# Patient Record
Sex: Female | Born: 1945
Health system: Southern US, Community
[De-identification: ages and names within clinical notes are randomized; demographics above are authoritative.]

## PROBLEM LIST (undated history)

## (undated) DIAGNOSIS — T4145XA Adverse effect of unspecified anesthetic, initial encounter: Secondary | ICD-10-CM

## (undated) DIAGNOSIS — T7840XA Allergy, unspecified, initial encounter: Secondary | ICD-10-CM

## (undated) DIAGNOSIS — A048 Other specified bacterial intestinal infections: Secondary | ICD-10-CM

## (undated) DIAGNOSIS — R51 Headache: Secondary | ICD-10-CM

## (undated) DIAGNOSIS — R911 Solitary pulmonary nodule: Secondary | ICD-10-CM

## (undated) DIAGNOSIS — M1990 Unspecified osteoarthritis, unspecified site: Secondary | ICD-10-CM

## (undated) DIAGNOSIS — I499 Cardiac arrhythmia, unspecified: Secondary | ICD-10-CM

## (undated) DIAGNOSIS — K449 Diaphragmatic hernia without obstruction or gangrene: Secondary | ICD-10-CM

## (undated) DIAGNOSIS — K2970 Gastritis, unspecified, without bleeding: Secondary | ICD-10-CM

## (undated) DIAGNOSIS — K219 Gastro-esophageal reflux disease without esophagitis: Secondary | ICD-10-CM

## (undated) DIAGNOSIS — M549 Dorsalgia, unspecified: Secondary | ICD-10-CM

## (undated) DIAGNOSIS — R519 Headache, unspecified: Secondary | ICD-10-CM

## (undated) DIAGNOSIS — Z86010 Personal history of colonic polyps: Secondary | ICD-10-CM

## (undated) DIAGNOSIS — E785 Hyperlipidemia, unspecified: Secondary | ICD-10-CM

## (undated) DIAGNOSIS — C801 Malignant (primary) neoplasm, unspecified: Secondary | ICD-10-CM

## (undated) DIAGNOSIS — E039 Hypothyroidism, unspecified: Secondary | ICD-10-CM

## (undated) DIAGNOSIS — T8859XA Other complications of anesthesia, initial encounter: Secondary | ICD-10-CM

## (undated) DIAGNOSIS — IMO0001 Reserved for inherently not codable concepts without codable children: Secondary | ICD-10-CM

## (undated) HISTORY — PX: TMJ ARTHROSCOPY: SHX1067

## (undated) HISTORY — DX: Allergy, unspecified, initial encounter: T78.40XA

## (undated) HISTORY — DX: Other specified bacterial intestinal infections: A04.8

## (undated) HISTORY — PX: DILATION AND CURETTAGE OF UTERUS: SHX78

## (undated) HISTORY — DX: Gastro-esophageal reflux disease without esophagitis: K21.9

## (undated) HISTORY — DX: Solitary pulmonary nodule: R91.1

## (undated) HISTORY — DX: Personal history of colonic polyps: Z86.010

## (undated) HISTORY — DX: Unspecified osteoarthritis, unspecified site: M19.90

## (undated) HISTORY — PX: TOTAL ABDOMINAL HYSTERECTOMY: SHX209

## (undated) HISTORY — DX: Gastritis, unspecified, without bleeding: K29.70

## (undated) HISTORY — DX: Hyperlipidemia, unspecified: E78.5

## (undated) HISTORY — DX: Diaphragmatic hernia without obstruction or gangrene: K44.9

## (undated) HISTORY — PX: LAPAROSCOPY: SHX197

## (undated) HISTORY — PX: APPENDECTOMY: SHX54

## (undated) HISTORY — DX: Dorsalgia, unspecified: M54.9

---

## 1998-02-14 ENCOUNTER — Other Ambulatory Visit: Admission: RE | Admit: 1998-02-14 | Discharge: 1998-02-14 | Payer: Self-pay | Admitting: Obstetrics and Gynecology

## 1998-03-17 ENCOUNTER — Encounter: Payer: Self-pay | Admitting: Emergency Medicine

## 1998-03-17 ENCOUNTER — Emergency Department (HOSPITAL_COMMUNITY): Admission: EM | Admit: 1998-03-17 | Discharge: 1998-03-17 | Payer: Self-pay | Admitting: Emergency Medicine

## 2000-06-03 ENCOUNTER — Encounter (INDEPENDENT_AMBULATORY_CARE_PROVIDER_SITE_OTHER): Payer: Self-pay | Admitting: Gastroenterology

## 2001-03-06 ENCOUNTER — Other Ambulatory Visit: Admission: RE | Admit: 2001-03-06 | Discharge: 2001-03-06 | Payer: Self-pay | Admitting: Obstetrics and Gynecology

## 2003-12-23 ENCOUNTER — Ambulatory Visit: Payer: Self-pay | Admitting: Gastroenterology

## 2004-03-13 ENCOUNTER — Other Ambulatory Visit: Admission: RE | Admit: 2004-03-13 | Discharge: 2004-03-13 | Payer: Self-pay | Admitting: Obstetrics and Gynecology

## 2004-04-27 ENCOUNTER — Inpatient Hospital Stay (HOSPITAL_COMMUNITY): Admission: EM | Admit: 2004-04-27 | Discharge: 2004-04-30 | Payer: Self-pay | Admitting: Emergency Medicine

## 2004-05-02 ENCOUNTER — Inpatient Hospital Stay (HOSPITAL_COMMUNITY): Admission: EM | Admit: 2004-05-02 | Discharge: 2004-05-04 | Payer: Self-pay | Admitting: Emergency Medicine

## 2004-05-02 ENCOUNTER — Ambulatory Visit: Payer: Self-pay | Admitting: Infectious Diseases

## 2004-05-06 ENCOUNTER — Ambulatory Visit: Payer: Self-pay | Admitting: Internal Medicine

## 2004-05-27 ENCOUNTER — Ambulatory Visit: Payer: Self-pay | Admitting: Infectious Diseases

## 2005-01-12 ENCOUNTER — Ambulatory Visit: Payer: Self-pay | Admitting: Internal Medicine

## 2005-05-06 ENCOUNTER — Ambulatory Visit: Payer: Self-pay | Admitting: Gastroenterology

## 2007-01-03 ENCOUNTER — Ambulatory Visit: Payer: Self-pay | Admitting: Gastroenterology

## 2007-01-18 ENCOUNTER — Ambulatory Visit: Payer: Self-pay | Admitting: Gastroenterology

## 2008-05-27 DIAGNOSIS — K2970 Gastritis, unspecified, without bleeding: Secondary | ICD-10-CM | POA: Insufficient documentation

## 2008-05-27 DIAGNOSIS — K222 Esophageal obstruction: Secondary | ICD-10-CM | POA: Insufficient documentation

## 2008-05-27 DIAGNOSIS — K2990 Gastroduodenitis, unspecified, without bleeding: Secondary | ICD-10-CM

## 2008-05-28 ENCOUNTER — Ambulatory Visit: Payer: Self-pay | Admitting: Gastroenterology

## 2008-05-28 DIAGNOSIS — R070 Pain in throat: Secondary | ICD-10-CM | POA: Insufficient documentation

## 2008-05-28 DIAGNOSIS — K219 Gastro-esophageal reflux disease without esophagitis: Secondary | ICD-10-CM

## 2008-05-28 HISTORY — DX: Gastro-esophageal reflux disease without esophagitis: K21.9

## 2008-06-17 ENCOUNTER — Ambulatory Visit: Payer: Self-pay | Admitting: Gastroenterology

## 2008-06-17 ENCOUNTER — Encounter: Payer: Self-pay | Admitting: Gastroenterology

## 2008-06-19 ENCOUNTER — Encounter: Payer: Self-pay | Admitting: Gastroenterology

## 2008-07-30 ENCOUNTER — Ambulatory Visit: Payer: Self-pay | Admitting: Gastroenterology

## 2010-03-10 NOTE — Assessment & Plan Note (Signed)
Summary: ACID REFLUX.[name] KitchenEM    History of Present Illness Visit Type: Follow-up Consult Primary GI MD: Sheryn Bison MD FACP FAGA Primary Catheryn Slifer: Gery Pray, MD Chief Complaint: throat pain History of Present Illness:   This patient is a 65 year old white female self-referred for evaluation of hoarseness and discomfort in her throat with atypical reflux symptoms. Her last endoscopic exam was by Dr. Victorino Dike in 2002. At that time she did have a peptic stricture versus off as was dilated.  She denies typical reflux symptoms such as burning substernal chest pain regurgitation. However, her throat difficult to do occur at night and waking her from sleep. She denies other gastrointestinal or hepatobiliary complaints. She had normal metaprolol 20 mg a day with breakfast for many years. She is up-to-date on colonoscopy exams which she has done because of a family history of colon cancer her father. She denies bowel problems, melena, or hematochezia. Her appetite is good and her weight is stable. 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 She does not smoke or abuse ethanol or NSAIDs.   GI Review of Systems    Reports heartburn.      Denies abdominal pain, acid reflux, belching, bloating, chest pain, dysphagia with liquids, dysphagia with solids, loss of appetite, nausea, vomiting, vomiting blood, weight loss, and  weight gain.        Denies anal fissure, black tarry stools, change in bowel habit, constipation, diarrhea, diverticulosis, fecal incontinence, heme positive stool, hemorrhoids, irritable bowel syndrome, jaundice, light color stool, liver problems, rectal bleeding, and  rectal pain.    Current Medications (verified): 1)  Omeprazole 20 Mg Cpdr (Omeprazole) .... Take One Capsule Daily  Allergies (verified): No Known Drug Allergies  Past History:  Past Medical History:    Current Problems:     COLONIC POLYPS, HX OF (ICD-V12.72)    GASTRITIS  (ICD-535.50)    ESOPHAGEAL STRICTURE (ICD-530.3)    ADENOCARCINOMA, COLON, FAMILY HX (ICD-V16.0)    DIVERTICULOSIS, COLON (ICD-562.10)     (05/27/2008)  Past Surgical History:    Hysterectomy and bilateral oophorectomy 1978    Right TMJ 1985 (05/27/2008)  Family History:    Family History of Colon Cancer: father    Family History of Heart Disease: mother    Family History of Diabetes:Brother    Family History of Kidney Disease:Brother    Family History of Ovarian Cancer:Sister    Family History of Colon Polyps:Brother  Review of Systems       The patient complains of arthritis/joint pain.  The patient denies allergy/sinus, anemia, anxiety-new, back pain, blood in urine, breast changes/lumps, change in vision, confusion, cough, coughing up blood, depression-new, fainting, fatigue, fever, headaches-new, hearing problems, heart murmur, heart rhythm changes, itching, menstrual pain, muscle pains/cramps, night sweats, nosebleeds, pregnancy symptoms, shortness of breath, skin rash, sleeping problems, sore throat, swelling of feet/legs, swollen lymph glands, thirst - excessive , urination - excessive , urination changes/pain, urine leakage, vision changes, and voice change.    Vital Signs:  Patient profile:   65 year old female Height:      62.5 inches Weight:      128.13 pounds BMI:     23.15 Pulse rate:   60 / minute Pulse rhythm:   regular BP sitting:   124 / 80  (right arm)  Vitals Entered By: June McMurray CMA (May 28, 2008 9:07 AM)  Physical Exam  General:  Well developed, well nourished, no acute distress.healthy appearing.   Head:  Normocephalic and atraumatic. Eyes:  PERRLA, no icterus.exam deferred to patient's ophthalmologist.   Mouth:  No deformity or lesions, dentition normal. Neck:  Supple; no masses or thyromegaly. Lungs:  Clear throughout to auscultation. Heart:  Regular rate and rhythm; no murmurs, rubs,  or bruits. Abdomen:  Soft, nontender and nondistended. No  masses, hepatosplenomegaly or hernias noted. Normal bowel sounds. Extremities:  No clubbing, cyanosis, edema or deformities noted. Neurologic:  Alert and  oriented x4;  grossly normal neurologically. Cervical Nodes:  No significant cervical adenopathy. Inguinal Nodes:  No significant inguinal adenopathy. Psych:  Alert and cooperative. Normal mood and affect.   Impression & Recommendations:  Problem # 1:  THROAT PAIN (ICD-784.1) Assessment Deteriorated This seems to be an extra esophageal manifestation of acid reflux. I have placed her on a strict antireflux regime and we'll change her to Nexium 40 mg 30 minutes before supper. She saw our  movie on acid reflux management . Repeat endoscopic exam has been scheduled.  Problem # 2:  COLONIC POLYPS, HX OF (ICD-V12.72) Assessment: Unchanged Colonoscopy followup as per clinical protocol.  Patient Instructions: 1)  Copy sent to : Dr. Pati Gallo 2)  Avoid foods high in acid content ( tomatoes, citrus juices, spicy foods) . Avoid eating within 3 to 4 hours of lying down or before exercising. Do not over eat; try smaller more frequent meals. Elevate head of bed four inches when sleeping.  3)  Conscious Sedation brochure given.  4)  Upper Endoscopy brochure given.  5)  Stop Omeprazole and begin Nexium 40 mg 30 minutes before supper. 6)  The medication list was reviewed and reconciled.  All changed / newly prescribed medications were explained.  A complete medication list was provided to the patient / caregiver.  Appended Document: Orders Update   Ikesha Siller watched the reflux movie in the office today.  Clinical Lists Changes  Problems: Added new problem of GERD (ICD-530.81) Medications: Changed medication from OMEPRAZOLE 20 MG CPDR (OMEPRAZOLE) Take one capsule daily to NEXIUM 40 MG  CPDR (ESOMEPRAZOLE MAGNESIUM) 1 capsule each day 30 minutes before meal - Signed Added new medication of CLOBETASOL PROPIONATE 0.05 % CREA (CLOBETASOL  PROPIONATE) appt as needed - Signed Rx of NEXIUM 40 MG  CPDR (ESOMEPRAZOLE MAGNESIUM) 1 capsule each day 30 minutes before meal;  #30 x 2;  Signed;  Entered by: Harlow Mares CMA;  Authorized by: Mardella Layman MD [facility];  Method used: [facility] to [facility] [phone]*, [address], [place], [place], [place]  [address], Ph: [phone] or [phone], Fax: [phone] Rx of CLOBETASOL PROPIONATE 0.05 % CREA (CLOBETASOL PROPIONATE) appt as needed;  #1 tube x 0;  Signed;  Entered by: Harlow Mares CMA;  Authorized by: Mardella Layman MD [facility];  Method used: Electronically to [facility] [phone]*, [address], [place], [place], [place]  [address], Ph: [phone] or [phone], Fax: [phone] Orders: Added new Test order of EGD (EGD) - Signed    Prescriptions: CLOBETASOL PROPIONATE 0.05 % CREA (CLOBETASOL PROPIONATE) appt as needed  #1 tube x 0   Entered by:   Harlow Mares CMA   Authorized by:   Mardella Layman MD FACG,FAGA   Signed by:   Harlow Mares CMA on 05/28/2008   Method used:   [facility]ronically to        [facility] [phone]* (retail)       [phone]  [place], [place]  [address]       Ph: [phone] or [phone]       Fax: [phone]   RxID:   [id number] NEXIUM 40 MG  CPDR (ESOMEPRAZOLE MAGNESIUM) 1 capsule each day 30 minutes before meal  #30 x 2   Entered by:   Harlow Mares CMA   Authorized by:   Mardella Layman MD FACG,FAGA   Signed by:   Harlow Mares CMA on 05/28/2008   Method used:   Electronically to        [facility] [phone]* (retail)       [address]       [place], [place]  [address]       Ph: [phone] or [phone]       Fax: [phone]   RxID:   [id number]

## 2010-03-10 NOTE — Letter (Signed)
Summary: Patient Notice-Endo Biopsy Results  [facility]  [address]   [place], [place] [address]   Phone: [phone]  Fax: [phone]        Jun 19, 2008 MRN: [id number]    [facility] [address] [place], [place]  [address]    Dear Ms. Steichen,  I am pleased to inform you that the biopsies taken during your recent endoscopic examination did not show any evidence of cancer upon pathologic examination.  Additional information/recommendations:  __No further action is needed at this time.  Please follow-up with      your primary care physician for your other healthcare needs.  __ Please call [phone] to schedule a return visit to review      your condition.  xx__ Continue with the treatment plan as outlined on the day of your      exam.  __ You should have a repeat endoscopic examination for this problem              in _ months/years.   Please call us if you are having persistent problems or have questions about your condition that have not been fully answered at this time.  Sincerely,  Mardella Layman MD [facility]  This letter has been electronically signed by your physician.

## 2010-03-10 NOTE — Procedures (Signed)
Summary: EGD   EGD  Procedure date:  06/17/2008  Findings:      Location: [facility]    ENDOSCOPY PROCEDURE REPORT  PATIENT:  Brooke [name], Brooke [name]  MR#:  [id number] BIRTHDATE:   [birth year], 63 yrs. old   GENDER:   female  ENDOSCOPIST:   Vania Rea. Jarold Motto, MD, [facility] Referred by:   PROCEDURE DATE:  06/17/2008 PROCEDURE:  EGD with biopsy ASA CLASS:   Class II INDICATIONS: GERD, persistent throat discomfort   MEDICATIONS:    Fentanyl 25 mcg IV, Versed 3 mg IV TOPICAL ANESTHETIC:   Exactacain Spray  DESCRIPTION OF PROCEDURE:   After the risks benefits and alternatives of the procedure were thoroughly explained, informed consent was obtained.  The LB [id number] endoscope was introduced through the mouth and advanced to the second portion of the duodenum, without limitations.  The instrument was slowly withdrawn as the mucosa was fully examined. <<PROCEDUREIMAGES>>    <<OLD IMAGES>>  The upper, middle, and distal third of the esophagus were carefully inspected and no abnormalities were noted. The z-line was well seen at the GEJ. The endoscope was pushed into the fundus which was normal including a retroflexed view. The antrum,gastric body, first and second part of the duodenum were unremarkable. ESOPHAGEAL BIOPSIES DONE.R/O EOSINOPHILIC ESOPHAGITIS.  A hiatal hernia was found. SMALL 2CM HH NOTED.    Retroflexed views revealed a hiatal hernia.    The scope was then withdrawn from the patient and the procedure completed.  COMPLICATIONS:   None  ENDOSCOPIC IMPRESSION:  1) Normal EGD  2) Hiatal hernia  PROBABLE CHRONIC GERD. RECOMMENDATIONS:  1) anti-reflux regimen  2) await biopsy results  3) continue current medications  4) follow-up: office 1 month(s)  REPEAT EXAM:   No   _______________________________ Vania Rea. Jarold Motto, MD, [facility]    CC:   This report was created from the original endoscopy report, which was reviewed and signed by the above  listed endoscopist.       REPORT OF SURGICAL PATHOLOGY   Case #: [id number] Patient Name: Brooke [name], Brooke [name] Office Chart Number:  [id number]   MRN: [id number] Pathologist: Beulah Gandy. Luisa Hart, MD DOB/Age  [birth year] (Age: 56)    Gender: F Date Taken:  06/17/2008 Date Received: 06/18/2008   FINAL DIAGNOSIS   ***MICROSCOPIC EXAMINATION AND DIAGNOSIS***   ESOPHAGUS, BIOPSIES:  BENIGN ESOPHAGEAL MUCOSA.  NO FEATURES OF EOSINOPHILIC ESOPHAGITIS, FUNGI, INTESTINAL METAPLASIA OR MALIGNANCY IDENTIFIED.    COMMENT No fungi are identified with PAS stain.   An Alcian Blue stain is performed to determine the presence of intestinal metaplasia (goblet cell metaplasia). No intestinal metaplasia (goblet cell metaplasia) is identified with the Alcian Blue stain. The control stained appropriately.  (JPD:mw 06/19/08)    mw Date Reported:  06/19/2008     Beulah Gandy. Luisa Hart, MD *** Electronically Signed Out By JDP ***        Jun 19, 2008 MRN: [id number]    [facility] [address] [place], [place]  [address]    Dear Ms. Clair,  I am pleased to inform you that the biopsies taken during your recent endoscopic examination did not show any evidence of cancer upon pathologic examination.  Additional information/recommendations:  __No further action is needed at this time.  Please follow-up with      your primary care physician for your other healthcare needs.  __ Please call [phone] to schedule a return visit to review      your condition.  xx__ Continue with  the treatment plan as outlined on the day of your      exam.  __ You should have a repeat endoscopic examination for this problem              in _ months/years.   Please call us if you are having persistent problems or have questions about your condition that have not been fully answered at this time.  Sincerely,  Mardella Layman MD [facility]  This letter has been electronically signed by your  physician.    This report was created from the original endoscopy report, which was reviewed and signed by the above listed endoscopist.

## 2010-03-10 NOTE — Procedures (Signed)
Summary: EGD   EGD  Procedure date:  06/03/2000  Findings:      Location: [facility]    EGD  Procedure date:  06/03/2000  Findings:      Location:  [facility]    Patient Name: Brooke [name], Brooke [name] MRN:  Procedure Procedures: Panendoscopy (EGD) CPT: 43235.  Personnel: Endoscopist: Ulyess Mort, MD.  Referred By: Miguel Aschoff, MD.  Exam Location: Exam performed in Outpatient Clinic. Outpatient  Patient Consent: Procedure, Alternatives, Risks and Benefits discussed, consent obtained, from patient.  Indications Symptoms: Dysphagia. Reflux symptoms  History  Pre-Exam Physical: Performed Jun 03, 2000  Cardio-pulmonary exam, Abdominal exam, Extremity exam, Mental status exam WNL.  Exam Exam Info: Maximum depth of insertion Duodenum, intended Duodenum. Patient position: on left side. Vocal cords visualized. Gastric retroflexion performed. Images taken. ASA Classification: I. Tolerance: good.  Sedation Meds: Fentanyl 50 mcg. Versed 5 mg. Cetacaine Spray 1 sprays  Monitoring: BP and pulse monitoring done. Oximetry used. Supplemental O2 given  Findings - HIATAL HERNIA: 3 cms. in length. ICD9: Esophageal Stricture: 530.3. STRICTURE / STENOSIS: Distal Esophagus.  Constriction: partial. ICD9: Esophageal Stricture: 530.3.  - Dilation: Distal Esophagus. Maloney dilator used, Diameter: 54 mm, Minimal Resistance, No Heme present on extraction.  - MUCOSAL ABNORMALITY: Duodenal Bulb to Jejunum. Granular mucosa.  - MUCOSAL ABNORMALITY: Body to Antrum. Erythematous mucosa. Friable mucosa. RUT done, results pending. ICD9: Gastritis with Hemorrhage: 535.51.   Assessment  Diagnoses: 530.3: Esophageal Stricture.  530.3: Esophageal Stricture.  535.51: Gastritis with Hemorrhage.   Events  Unplanned Intervention: No unplanned interventions were required.  Unplanned Events: There were no complications. Plans Disposition: After procedure patient  sent to recovery. After recovery patient sent home.   CC: Donovan Kail, MD     Titus Dubin. Alwyn Ren, MD  This report was created from the original endoscopy report, which was reviewed and signed by the above listed endoscopist.

## 2010-03-10 NOTE — Assessment & Plan Note (Signed)
Summary: 2 months f./u ./em    History of Present Illness Visit Type: follow up  Primary GI MD: Sheryn Bison MD FACP FAGA Primary Provider: Gery Pray, MD Requesting Provider: n/a Chief Complaint: F/u for endoscopy. Pt said that she is still having acid reflux but is alot better. History of Present Illness:   Brooke [name] is doing better symptomatically on twice a day Nexium. She denies true dysphagia but continues to have some pain in her right lateral neck area. She had recent endoscopy that showed 2 cm hiatal hernia and esophageal biopsies showed no evidence of eosinophilic esophagitis. She denies any lower gastrointestinal or hepatobiliary complaints.   GI Review of Systems      Denies abdominal pain, acid reflux, belching, bloating, chest pain, dysphagia with liquids, dysphagia with solids, heartburn, loss of appetite, nausea, vomiting, vomiting blood, weight loss, and  weight gain.      Reports light color stool.     Denies anal fissure, black tarry stools, change in bowel habit, constipation, diarrhea, diverticulosis, fecal incontinence, heme positive stool, hemorrhoids, irritable bowel syndrome, jaundice, liver problems, rectal bleeding, and  rectal pain.    Current Medications (verified): 1)  Clobetasol Propionate 0.05 % Crea (Clobetasol Propionate) .... Appt As Needed 2)  Nexium 40 Mg  Cpdr (Esomeprazole Magnesium) .[name] Kitchen.. 1 Capsule Twice A Day 30 Minutes Before Meals 3)  Fosamax 70 Mg Tabs (Alendronate Sodium) .... One Tablet By Mouth Once A Week  Allergies (verified): No Known Drug Allergies  Past History:  Past medical, surgical, family and social histories (including risk factors) reviewed for relevance to current acute and chronic problems.  Past Medical History: Reviewed history from 07/29/2008 and no changes required. Current Problems:  HIATAL HERNIA (ICD-553.3) GERD (ICD-530.81) THROAT PAIN (ICD-784.1) COLONIC POLYPS, HX OF (ICD-V12.72) GASTRITIS  (ICD-535.50) ESOPHAGEAL STRICTURE (ICD-530.3) ADENOCARCINOMA, COLON, FAMILY HX (ICD-V16.0) DIVERTICULOSIS, COLON (ICD-562.10)  Past Surgical History: Reviewed history from 05/27/2008 and no changes required. Hysterectomy and bilateral oophorectomy 1978 Right TMJ 1985  Family History: Reviewed history from 05/28/2008 and no changes required. Family History of Colon Cancer: father Family History of Heart Disease: mother Family History of Diabetes:Brother Family History of Kidney Disease:Brother Family History of Ovarian Cancer:Sister Family History of Colon Polyps:Brother  Social History: Reviewed history from 05/27/2008 and no changes required. Occupation: Merchandiser, retail at [employer] Married with one daughter Patient has never smoked.  Alcohol Use - yes Illicit Drug Use - no Patient does not get regular exercise.  Daily Caffeine Use: 2 cups coffee daily  Does Patient Exercise:  no  Vital Signs:  Patient profile:   65 year old female Height:      62.5 inches Weight:      134.4 pounds BMI:     24.28 BSA:     1.63 Pulse rate:   68 / minute Pulse rhythm:   regular BP sitting:   124 / 72  (left arm) Cuff size:   regular  Vitals Entered By: Ok Anis CMA (July 30, 2008 4:01 PM)  Physical Exam  General:  Well developed, well nourished, no acute distress.healthy appearing.   Head:  Normocephalic and atraumatic. Eyes:  PERRLA, no icterus.exam deferred to patient's ophthalmologist.   Mouth:  No deformity or lesions, dentition normal. Neck:  Supple; no masses or thyromegaly. Psych:  Alert and cooperative. Normal mood and affect.   Impression & Recommendations:  Problem # 1:  THROAT PAIN (ICD-784.1) Assessment Improved Again is seen to be related to acid reflux and has improved markedly on twice  a day PPI therapy. Examination of throat and neck today otherwise is unremarkable. I reviewed a reflux regime with her he'll continue twice a day Nexium therapy with office followup in 6  weeks' time.  Problem # 2:  GERD (ICD-530.81) Assessment: Improved as per above.  Problem # 3:  COLONIC POLYPS, HX OF (ICD-V12.72) Assessment: Unchanged followup as per clinical protocol.  Problem # 4:  DIVERTICULOSIS, COLON (ICD-562.10) Assessment: Improved high-fiber diet as tolerated.  Patient Instructions: 1)  Copy sent to : Dr. Pati Gallo 2)  Please continue current medications.  3)  Diet should be high in fiber ( fruits, vegetables, whole grains) but low in residue. Drink at least eight (8) glasses of water a day.  4)  Avoid foods high in acid content ( tomatoes, citrus juices, spicy foods) . Avoid eating within 3 to 4 hours of lying down or before exercising. Do not over eat; try smaller more frequent meals. Elevate head of bed four inches when sleeping.  5)  Please schedule a follow-up appointment in 6 to 8 weeks.   Appended Document: 2 months f./u ./em    Clinical Lists Changes  Medications: Changed medication from NEXIUM 40 MG  CPDR (ESOMEPRAZOLE MAGNESIUM) 1 capsule twice a day 30 minutes before meals to NEXIUM 40 MG  CPDR (ESOMEPRAZOLE MAGNESIUM) 1 capsule twice a day 30 minutes before meals

## 2010-03-10 NOTE — Procedures (Signed)
Summary: Colonoscopy   Colonoscopy  Procedure date:  01/18/2007  Findings:      Location:  [facility].    Procedures Next Due Date:    Colonoscopy: 01/2012  Patient Name: Brooke [name], Brooke [name] MRN:  Procedure Procedures: Colonoscopy CPT: 14782.  Personnel: Endoscopist: Vania Rea. Jarold Motto, MD.  Exam Location: Exam performed in Outpatient Clinic. Outpatient  Patient Consent: Procedure, Alternatives, Risks and Benefits discussed, consent obtained, from patient. Consent was obtained by the RN.  Indications  Increased Risk Screening: For family history of colorectal neoplasia, in  parent age at onset: 51. grandparent  History  Current Medications: Patient is not currently taking Coumadin.  Medical/ Surgical History: Reflux Disease, Osteoarthritis,  Pre-Exam Physical: Performed Jan 18, 2007. Cardio-pulmonary exam, Rectal exam, Abdominal exam, Extremity exam, Mental status exam WNL.  Comments: Pt. history reviewed/updated, physical exam performed prior to initiation of sedation? yes Exam Exam: Extent of exam reached: Cecum, extent intended: Cecum.  The cecum was identified by appendiceal orifice and IC valve. Patient position: on left side. Time to Cecum: 00:03:54. Time for Withdrawl: 00:06:02. Colon retroflexion performed. Images taken. ASA Classification: II. Tolerance: excellent.  Monitoring: Pulse and BP monitoring, Oximetry used. Supplemental O2 given. at 2 Liters.  Colon Prep Used Golytely for colon prep. Prep results: excellent.  Sedation Meds: Patient assessed and found to be appropriate for moderate (conscious) sedation. Sedation was managed by the Endoscopist. Fentanyl 75 mcg. given IV. Versed 6 mg. given IV.  Instrument(s): CF 140L. Serial [id number].  Findings - NORMAL EXAM: Cecum to Splenic Flexure. Not Seen: Polyps. AVM's. Colitis. Tumors. Melanosis. Crohn's. Diverticulosis.  - DIVERTICULOSIS: Descending Colon to Sigmoid Colon. Not  bleeding. ICD9: Diverticulosis, Colon: 562.10.  - NORMAL EXAM: Sigmoid Colon to Rectum. Tumors. Crohn's. Hemorrhoids.   Assessment  Diagnoses: 562.10: Diverticulosis, Colon.   Comments: NO POLYPS NOTED... Events  Unplanned Interventions: No intervention was required.  Plans Medication Plan: Continue current medications.  Patient Education: Patient given standard instructions for: Diverticulosis. Patient instructed to get routine colonoscopy every 5 years.  Disposition: After procedure patient sent to recovery. After recovery patient sent home.  Scheduling/Referral: Follow-Up prn.   CC: Pati Gallo, MD  This report was created from the original endoscopy report, which was reviewed and signed by the above listed endoscopist.

## 2010-03-10 NOTE — Miscellaneous (Signed)
Summary: nexium  Clinical Lists Changes  Medications: Added new medication of NEXIUM 40 MG  CPDR (ESOMEPRAZOLE MAGNESIUM) 1 capsule twice a day 30 minutes before meals - Signed Rx of NEXIUM 40 MG  CPDR (ESOMEPRAZOLE MAGNESIUM) 1 capsule twice a day 30 minutes before meals;  #62 x 6;  Signed;  Entered by: Burman Foster RN;  Authorized by: Mardella Layman MD [facility];  Method used: Electronically to [facility] [phone]*, [address], [place], [place], [place]  [address], Ph: [phone] or [phone], Fax: [phone]    Prescriptions: NEXIUM 40 MG  CPDR (ESOMEPRAZOLE MAGNESIUM) 1 capsule twice a day 30 minutes before meals  #62 x 6   Entered by:   Burman Foster RN   Authorized by:   Mardella Layman MD [facility]   Signed by:   Burman Foster RN on 06/17/2008   Method used:   Electronically to        [facility] [phone]* (retail)       [address]       [place], [place]  [address]       Ph: [phone] or [phone]       Fax: [phone]   RxID:   [id number]

## 2010-06-02 ENCOUNTER — Other Ambulatory Visit: Payer: Self-pay | Admitting: Obstetrics and Gynecology

## 2010-06-26 NOTE — Discharge Summary (Signed)
NAMEADILENE, Brooke [name]           ACCOUNT NO.:  [id number]   MEDICAL RECORD NO.:  [id number]          PATIENT TYPE:  INP   LOCATION:  6731                         FACILITY:  [facility]   PHYSICIAN:  Duncan Dull, M.D.     DATE OF BIRTH:  02/18/1945   DATE OF ADMISSION:  04/27/2004  DATE OF DISCHARGE:  04/30/2004                                 DISCHARGE SUMMARY   DISCHARGE DIAGNOSIS:  1.  Erysipelas of right face, forehead, and right side of scalp.  2.  Transitory proteinuria/hypoalbuminemia.   DISCHARGE MEDICATIONS:  Amoxicillin 875 mg 1 tablet p.o. b.i.d. for ten  days, Doxycycline 100 mg 1 tablet p.o. b.i.d. for ten days, Tylenol 325 mg 1-  2 tablets q.6h. p.o. p.r.n. pain.   DISPOSITION:  The patient is to go home with hospital follow up at the  outpatient clinic at [facility] on May 06, 2004, at 3:30 p.m.  when CBC is going to be drawn to check the white blood cell count.  Also, a  CMP is going to be done to check mainly on albumin level and, also, a  urinalysis is going to be drawn to recheck for proteinuria.  Also, to follow  up on the response to oral antibiotics from the erysipelas point of view.   PROCEDURE:  April 27, 2004, CT maxillofacial with and without contrast with  impression skin thickening subcutaneous flat stranding in the distribution  of the patient's known skin rash and erythema.  No focal abscesses or  underlying sinus disease is identified.  CT of the head and maxillofacial  April 28, 2004, showed mild preseptal right orbital cellulitis, stable when  compared with April 27, 2004.  No atrophy or significant abnormal  enhancement.  Scattered hypodensities in the subcortical white matter likely  representing microvascular ischemic change.   CONSULTATIONS:  None.   HISTORY OF PRESENT ILLNESS:  65 year old woman without significant past  medical history comes to the emergency room  with history of right facial  pain plus redness on that area that  started three days ago around the area  of right ear and started to spread on the right side of her face and scalp.  She went to the Urgent Care Clinic at [facility] where she received Rocephin  1 gram.  Despite this treatment, the redness continued to spread and for  that reason, she came to the emergency room.  She also has had chills and  fever, quantified between 100 and 101, nausea, dry heaves, dizziness, and  postural vertigo, mainly when she is standing.   PHYSICAL EXAMINATION:  On physical exam, she had erythema of the right side  of her face, most of the forehead, and part of the right side of the scalp  and the right ear.  Also, she had some compromise of the bridge of the nose  with this bright, red, raised erythema with a clear border.  No adenopathy  palpated during admission.  Pulse 109, blood pressure lying 106/67, pulse  102, sitting blood pressure 106/67, pulse 112, standing blood pressure  108/71 with pulse 109, temperature 100.1, respiratory rate 22,  oxygen  saturation 97% on room air.   LABORATORY DATA:  Sodium 135, potassium 3.1, chloride 102, CO2 22, BUN 12,  creatinine 1.1, glucose 113, rapid Strep of the throat positive.  Urinalysis  with specific gravity 1.035, small bili, more than 80 ketones, more than 300  protein.  White blood cell count 16.3, hemoglobin 12.1, hematocrit 34,  platelets 180, ANC 14.5, MCV 89, anion gap 11, bilirubin 1.1, alkaline phos  89, AST 31, ALT 26, protein 6.2, albumin 2.9, calcium 8.2.   HOSPITAL COURSE:  Problem 1:  Erysipelas of the right face, forehead, and right side of the  scalp.  The patient was admitted and after the CT scan was started on  vancomycin and clindamycin IV and previous to this therapy, blood cultures  were done.  The patient had a good response to this treatment with  decreasing on her edema and erythema.  Also, the patient became afebrile 24  hours after admission.  Also, white blood cell count came back to  normal and  the patient was able to eat and ambulate without problems.  During rounds it  was discussed about therapy as an outpatient and we decided to start it one  day prior to discharge on Amoxicillin plus Doxycycline as we were unable to  get any blood culture positive on this patient and we wanted to cover the  Streptococcus beta group A and also community acquired MRSA.  The patient is  to go home on this treatment and come back for a follow up at our outpatient  clinic.   Problem 2:  Transitory proteinuria/hypoalbuminemia.  When the patient was  admitted, was found to be with low albumin and more than 300 protein in  urine within 24 hour protein collection that showed total protein of 368 mg  a day and also we repeated the urinalysis that came back yesterday  absolutely normal with no protein.  We thought that this is most probably a  transitory proteinuria due to the current infection and we will follow up  this as an outpatient with a CMP and urinalysis.   LABS AT DISCHARGE:  White blood cell count 8.7,  hemoglobin 11.3, hematocrit  31.5, MCV 89.4, platelets 279, sodium 137, potassium 3.7, chloride 106, CO2  27, glucose 95, BUN 5, creatinine 0.8, calcium 8.3, blood cultures x 2  negative, no growth to date.      YC/MEDQ  D:  04/30/2004  T:  04/30/2004  Job:  [id number]

## 2010-06-26 NOTE — Discharge Summary (Signed)
Brooke [name], Brooke [name]           ACCOUNT NO.:  [id number]   MEDICAL RECORD NO.:  [id number]          PATIENT TYPE:  INP   LOCATION:  5739                         FACILITY:  [facility]   PHYSICIAN:  Vanetta Mulders, MD         DATE OF BIRTH:  1945/08/14   DATE OF ADMISSION:  05/02/2004  DATE OF DISCHARGE:  05/04/2004                                 DISCHARGE SUMMARY   DISCHARGE DIAGNOSES:  1.  Facial cellulitis.  2.  Transitory proteinuria.   DISCHARGE MEDICATIONS:  1.  Amoxicillin 875 mg p.o. b.i.d. for 10 days.  2.  Rifampin 300 mg p.o. b.i.d. for 10 days.   PAIN MANAGEMENT:  Tylenol 325 mg p.o. q.4h p.r.n.   ACTIVITY:  No restriction.   DIET:  No restriction.   FOLLOW UP:  Follow up with Dr. Maurice March in ID Clinic.   CONSULTATIONS:  Dr. Roxan Hockey, infectious disease.   PROCEDURES AT THIS ADMISSION:  None.   HISTORY OF PRESENT ILLNESS:  This is a 65 year old lady with past medical  history significant for cellulitis of the face.  First episode noticed in  October 2005.  Discharged from the hospital several days prior to this  admission for this same problem of cellulitis of the face.  Comes back to  the emergency room for unresolving and worsening of her facial rash, fever,  and chills.   ALLERGIES:  Unknown.   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   PHYSICAL EXAM ON ADMISSION:  VITAL SIGNS:  Heart rate 89, blood pressure  123/78, temperature 97.4, respiratory rate 20, O2 saturation 97% on room  air.  GENERAL:  A 65 year old white lady laying in bed in no acute distress.  HEENT:  Eyes:  PERRL.  EOMI.  ENT:  Clear oropharynx.  Erythema of the right  side of the face, ear, and slight erythema of the forehead and scalp on the  right.  NECK:  Supple.  Adenopathy 2-3 cm bilaterally.  No thyromegaly.  RESPIRATORY:  Clear to auscultation bilaterally.  CARDIOVASCULAR:  Regular rate and rhythm.  No murmurs,  rubs or gallops.  No  jugular venous distention.  GI:  Bowel sounds are positive.  Soft, nontender, nondistended.  EXTREMITIES:  No edema.  SKIN:  Dry and scaly, especially on the hand.  No lymphadenopathy in  axillary or inguinal area.  Lymphadenopathy on the neck is previously  described.  NEURO:  Musculoskeletal strength 5/5 throughout.  No deficit.  Psychologically oriented x3.   LABS ON ADMISSION:  Sodium 140, potassium 4.2, chloride 104, bicarb 29, BUN  7, creatinine 0.7, glucose 81, hemoglobin 12.7, hematocrit 36.2, white blood  cells 10.9 and platelets 401.   HOSPITAL COURSE:  1.  Facial cellulitis.  Worsening rash on the right side of the face.      Previously admitted and treated in the hospital and discharged home on      amoxicillin and doxycycline.  We discussed the patient with Dr. Roxan Hockey      and the interpretation of the worsening of the  rash was that patient      underwent hypersensitivity type 4 reaction, __________mediated secondary      to likely residual effect of antibiotics over Strep group A bacteria      with release of toxins in the tissue.  We admitted the patient in the      hospital.  We started antibiotics IV vancomycin.  On discharge, the      patient is stable and the rash is almost entirely resolved.  She will      continue antibiotics for 10 more days.  This time, amoxicillin and      Rifampin, and she will follow up as outpatient with Dr. Maurice March in the ID      Clinic.  2.  Transient proteinuria.  Noticed that previous admission with protein      more than 300 in urine.  Will recheck the urinalysis at this admission      and urinalysis was continuously negative for protein.  Transient      proteinuria is over.   LABS AT DISCHARGE:  Sodium 141, potassium 3.9, chloride 107, bicarb 27, BUN  11, creatinine 0.8, glucose 89, hemoglobin 11.3, white blood cells 8.6 and  platelets 413.   We advised patient to follow up with ID Clinic and also we advised  her to  discuss with her daughter and her husband both relatives living with her for  testing for group A Streptococci.  If they are positive, we would advise for  eradication of infection because they can be carriers of this strain and  this would lead to re-infection of our patient with bacteria which  would  cause the current cellulitis.      DA/MEDQ  D:  05/04/2004  T:  05/04/2004  Job:  [id number]

## 2010-06-26 NOTE — Discharge Summary (Signed)
[name], [name]           ACCOUNT NO.:  [id number]   MEDICAL RECORD NO.:  [id number]          PATIENT TYPE:  INP   LOCATION:  5739                         FACILITY:  [facility]   PHYSICIAN:  Vanetta Mulders, MD         DATE OF BIRTH:  [birth year]   DATE OF ADMISSION:  05/02/2004  DATE OF DISCHARGE:  05/04/2004                                 DISCHARGE SUMMARY   DISCHARGE DIAGNOSES:  1.  __________.  2.  Status post hysterectomy.  3.  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  4.  Chronic back pain.  5.  Unspecific bronchitis.   DISCHARGE MEDICATIONS:  1.  Amoxicillin 875 mg p.o. b.i.d. for 10 days.  2.  Rifampin 300 mg p.o. b.i.d. for 10 days.   DISCHARGE INSTRUCTIONS:  The patient is suppose to followup with Dr. Maurice March in  [facility]D Clinic on May 27, 2004 at 3:40 p.m.   HISTORY OF PRESENT ILLNESS:  [name] is a 65 year old white lady with  a past medical history significant for __________ of the face.  Discharged  from the hospital several days prior to admission.  She comes back for an  unresolving rash, fever and chills that has been worse over the past two to  three days prior to admission.   ALLERGIES:  None.   PAST MEDICAL HISTORY:  Significant for palmar and plantar eczema.  Status  post hysterectomy.  TMJ surgery on the right in 1984.  Back pain in October  2005.  Unspecific bronchitis.   CURRENT MEDICATIONS:  Amoxicillin 875 mg one tablet p.o. b.i.d.  Doxycycline  100 mg p.o. b.i.d.  Tylenol 325 mg 1-2 tablets q.6h. p.r.n.   SOCIAL HISTORY:  She has never smoked.  She drinks alcohol on special  occasions.  She is married.  Her education goes up to tenth grade.  She has  [employer].   FAMILY HISTORY:  Mother that died at 78 years old with CHF and father died  at 68 years old with colon cancer.  She has two sisters and two brothers,  one sister and one brother had cancer and the patient does not know exactly  what type.   REVIEW OF SYSTEMS:   Positive just for fever and chills.   PHYSICAL EXAMINATION:  VITAL SIGNS:  Heart rate 89, blood pressure 123/78,  temperature 97.4, respiratory rate 20, O2 saturation 100% on room air.  GENERAL:  She is a 65 year old white lady lying in bed in no acute distress.  HEENT:  Eyes:  PERRLA.  Extraocular movements intact.  ENT:  Clear.  Oropharynx:  Erythema of the right side of the face, and whole forehead plus  erythema of the tympanic membrane on the right side.  NECK:  Supple.  Lymphadenopathy bilateral 2-3 cm, nontender and no  thyromegaly.  RESPIRATORY:  Clear to auscultation bilaterally.  CARDIOVASCULAR:  Regular rate and rhythm.  No murmurs, rubs, or gallops.  No  jugular venous distention.  ABDOMEN:  Bowel sounds positive, soft, nontender, nondistended.  EXTREMITIES:  No edema.  SKIN:  Dry and scaly especially on the hands.  LYMPH:  No lymphadenopathy.  MUSCULOSKELETAL:  Strength 5/5.  NEUROLOGIC:  II-XII intact.  No focal neurologic deficits.  PSYCHIATRIC:  Alert and oriented x3.   LABORATORY DATA:  Sodium 140, potassium 4.2, chloride 104, bicarbonate 29,  BUN 7, creatinine 0.7, glucose 81.  Hemoglobin 12.7, white blood cells 10.9  and platelets 401.   HOSPITAL COURSE:  Problem 1.  Facial cellulitis __________.  The patient's  condition was discussed with Dr. Roxan Hockey and per Dr. Roxan Hockey the patient's  worsening of symptoms was due most likely to hypersensitivity reaction type  4, lymphocyte-mediated secondary to antibody secondary to __________ pseudo  affect with massive release of strep group A toxin in the tissue.  During  the hospitalization we continued antibiotics and the patient was discharged  home on amoxicillin and Rifampin for 10 more days.  During the  hospitalization we checked a CT of the head which showed scattered cervical  adenopathy and mild decreased right facial soft tissue swelling and  subcutaneous edema in previous CT done at previous admission.   Improved  periorbital edema and/or cellulitis of the right orbit.  No evidence of  abscess or progression of disease.  CT of the head was done on May 02, 2004.  We also checked blood cultures which showed no growth x3.  The  patient was discharged home on antibiotics for several more days and we  explained the possibility of recurrent __________.  Problem 2.  Proteinuria.  During previous hospitalization the patient was  noted to have proteinuria.  At this admission we checked urinalysis which  showed no protein in urine.  The patient was discharged home in stable  condition.   LABORATORY AT DISCHARGE:  Sodium 141, potassium 3.9, chloride 107,  bicarbonate 27, BUN 11, creatinine 0.8, glucose 89.  White blood cells 8.6,  hemoglobin 11.3 and platelets 413.      DA/MEDQ  D:  06/29/2004  T:  06/29/2004  Job:  [id number]

## 2011-12-29 ENCOUNTER — Encounter: Payer: Self-pay | Admitting: Gastroenterology

## 2012-04-11 ENCOUNTER — Encounter: Payer: Self-pay | Admitting: Gastroenterology

## 2012-04-25 ENCOUNTER — Encounter: Payer: Self-pay | Admitting: Gastroenterology

## 2012-04-25 ENCOUNTER — Ambulatory Visit (AMBULATORY_SURGERY_CENTER): Payer: Medicare Other

## 2012-04-25 VITALS — Ht 63.0 in | Wt 136.1 lb

## 2012-04-25 DIAGNOSIS — Z1211 Encounter for screening for malignant neoplasm of colon: Secondary | ICD-10-CM

## 2012-04-25 DIAGNOSIS — Z86010 Personal history of colonic polyps: Secondary | ICD-10-CM

## 2012-04-25 DIAGNOSIS — Z800 Family history of malignant neoplasm of digestive organs: Secondary | ICD-10-CM

## 2012-04-25 MED ORDER — MOVIPREP 100 G PO SOLR: ORAL | Status: DC

## 2012-05-10 ENCOUNTER — Encounter: Payer: Self-pay | Admitting: Gastroenterology

## 2012-05-10 ENCOUNTER — Ambulatory Visit (AMBULATORY_SURGERY_CENTER): Payer: Medicare Other | Admitting: Gastroenterology

## 2012-05-10 VITALS — BP 110/78 | HR 72 | Temp 98.9°F | Resp 18 | Ht 63.0 in | Wt 136.0 lb

## 2012-05-10 DIAGNOSIS — Z1211 Encounter for screening for malignant neoplasm of colon: Secondary | ICD-10-CM

## 2012-05-10 DIAGNOSIS — Z800 Family history of malignant neoplasm of digestive organs: Secondary | ICD-10-CM

## 2012-05-10 DIAGNOSIS — K5730 Diverticulosis of large intestine without perforation or abscess without bleeding: Secondary | ICD-10-CM

## 2012-05-10 MED ORDER — SODIUM CHLORIDE 0.9 % IV SOLN: 500.0000 mL | INTRAVENOUS | Status: DC

## 2012-05-10 NOTE — Op Note (Signed)
[facility] [address] [place], [address]   COLONOSCOPY PROCEDURE REPORT  PATIENT: Brooke [name], Brooke [name]  MR#: [id number] BIRTHDATE: 02-Dec-1945 , 67  yrs. old GENDER: Female ENDOSCOPIST: Mardella Layman, MD, Clementeen Graham REFERRED BY:  Aida Puffer, M.D. PROCEDURE DATE:  05/10/2012 PROCEDURE:   Colonoscopy, screening ASA CLASS:   Class II INDICATIONS:Patient's immediate family history of colon cancer. MEDICATIONS: propofol (Diprivan) 200mg  IV  DESCRIPTION OF PROCEDURE:   After the risks and benefits and of the procedure were explained, informed consent was obtained.  A digital rectal exam revealed no abnormalities of the rectum.    The LB [id number]-[id number]  endoscope was introduced through the anus and advanced to the cecum, which was identified by both the appendix and ileocecal valve .  The quality of the prep was excellent, using MoviPrep .  The instrument was then slowly withdrawn as the colon was fully examined.     COLON FINDINGS: A normal appearing cecum, ileocecal valve, and appendiceal orifice were identified.  The ascending, hepatic flexure, transverse, splenic flexure, descending, sigmoid colon and rectum appeared unremarkable.  No polyps or cancers were seen. Retroflexed views revealed no abnormalities.     The scope was then withdrawn from the patient and the procedure completed.  COMPLICATIONS: There were no complications. ENDOSCOPIC IMPRESSION: Normal colon ..no polyps noted...  RECOMMENDATIONS: Given your significant family history of colon cancer, you should have a repeat colonoscopy in 5 years   REPEAT EXAM:  cc:  _______________________________ eSignedMardella Layman, MD, [facility] 05/10/2012 10:42 AM

## 2012-05-10 NOTE — Progress Notes (Signed)
Patient did not experience any of the following events: a burn prior to discharge; a fall within the facility; wrong site/side/patient/procedure/implant event; or a hospital transfer or hospital admission upon discharge from the facility. (G8907) Patient did not have preoperative order for IV antibiotic SSI prophylaxis. (G8918)  

## 2012-05-10 NOTE — Patient Instructions (Addendum)
Discharge instructions given with verbal understanding. Normal exam. Resume previous medications. YOU HAD AN ENDOSCOPIC PROCEDURE TODAY AT THE [facility]: Refer to the procedure report that was given to you for any specific questions about what was found during the examination.  If the procedure report does not answer your questions, please call your gastroenterologist to clarify.  If you requested that your care partner not be given the details of your procedure findings, then the procedure report has been included in a sealed envelope for you to review at your convenience later.  YOU SHOULD EXPECT: Some feelings of bloating in the abdomen. Passage of more gas than usual.  Walking can help get rid of the air that was put into your GI tract during the procedure and reduce the bloating. If you had a lower endoscopy (such as a colonoscopy or flexible sigmoidoscopy) you may notice spotting of blood in your stool or on the toilet paper. If you underwent a bowel prep for your procedure, then you may not have a normal bowel movement for a few days.  DIET: Your first meal following the procedure should be a light meal and then it is ok to progress to your normal diet.  A half-sandwich or bowl of soup is an example of a good first meal.  Heavy or fried foods are harder to digest and may make you feel nauseous or bloated.  Likewise meals heavy in dairy and vegetables can cause extra gas to form and this can also increase the bloating.  Drink plenty of fluids but you should avoid alcoholic beverages for 24 hours.  ACTIVITY: Your care partner should take you home directly after the procedure.  You should plan to take it easy, moving slowly for the rest of the day.  You can resume normal activity the day after the procedure however you should NOT DRIVE or use heavy machinery for 24 hours (because of the sedation medicines used during the test).    SYMPTOMS TO REPORT IMMEDIATELY: A gastroenterologist  can be reached at any hour.  During normal business hours, 8:30 AM to 5:00 PM Monday through Friday, call [phone].  After hours and on weekends, please call the GI answering service at [phone] who will take a message and have the physician on call contact you.   Following lower endoscopy (colonoscopy or flexible sigmoidoscopy):  Excessive amounts of blood in the stool  Significant tenderness or worsening of abdominal pains  Swelling of the abdomen that is new, acute  Fever of 100F or higher  FOLLOW UP: If any biopsies were taken you will be contacted by phone or by letter within the next 1-3 weeks.  Call your gastroenterologist if you have not heard about the biopsies in 3 weeks.  Our staff will call the home number listed on your records the next business day following your procedure to check on you and address any questions or concerns that you may have at that time regarding the information given to you following your procedure. This is a courtesy call and so if there is no answer at the home number and we have not heard from you through the emergency physician on call, we will assume that you have returned to your regular daily activities without incident.  SIGNATURES/CONFIDENTIALITY: You and/or your care partner have signed paperwork which will be entered into your electronic medical record.  These signatures attest to the fact that that the information above on your After Visit Summary has been reviewed   and is understood.  Full responsibility of the confidentiality of this discharge information lies with you and/or your care-partner. 

## 2012-05-10 NOTE — Progress Notes (Signed)
NO EGG OE SOY ALLERGY. EWM

## 2012-05-11 ENCOUNTER — Telehealth: Payer: Self-pay | Admitting: *Deleted

## 2012-05-11 NOTE — Telephone Encounter (Signed)
  Follow up Call-  Call back number 05/10/2012  Post procedure Call Back phone  # [phone]  Permission to leave phone message Yes     Patient questions:  Do you have a fever, pain , or abdominal swelling? no Pain Score  0 *  Have you tolerated food without any problems? yes  Have you been able to return to your normal activities? yes  Do you have any questions about your discharge instructions: Diet   no Medications  no Follow up visit  no  Do you have questions or concerns about your Care? no  Actions: * If pain score is 4 or above: No action needed, pain <4.

## 2012-06-14 ENCOUNTER — Other Ambulatory Visit: Payer: Self-pay | Admitting: Obstetrics and Gynecology

## 2013-09-26 ENCOUNTER — Other Ambulatory Visit: Payer: Self-pay

## 2013-10-17 ENCOUNTER — Encounter: Payer: Self-pay | Admitting: Internal Medicine

## 2013-11-13 ENCOUNTER — Telehealth: Payer: Self-pay | Admitting: Gastroenterology

## 2013-11-13 NOTE — Telephone Encounter (Signed)
Patient prefers Dr. Hilarie Fredrickson as new GI MD. She reports she is having problems with acid reflux. Her PCP has given her medication but it is not helping. 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 She will bring these results with her. Scheduled with Alonza Bogus, PA on 11/14/13 at 2:30 PM.

## 2013-11-14 ENCOUNTER — Ambulatory Visit (INDEPENDENT_AMBULATORY_CARE_PROVIDER_SITE_OTHER): Payer: Medicare HMO | Admitting: Gastroenterology

## 2013-11-14 ENCOUNTER — Encounter: Payer: Self-pay | Admitting: Gastroenterology

## 2013-11-14 VITALS — BP 118/70 | HR 78 | Ht 63.0 in | Wt 140.6 lb

## 2013-11-14 DIAGNOSIS — K219 Gastro-esophageal reflux disease without esophagitis: Secondary | ICD-10-CM

## 2013-11-14 MED ORDER — PANTOPRAZOLE SODIUM 40 MG PO TBEC: 40.0000 mg | DELAYED_RELEASE_TABLET | Freq: Two times a day (BID) | ORAL | Status: DC

## 2013-11-14 NOTE — Progress Notes (Addendum)
     11/14/2013 Brooke [name] [id number] [birth year]   History of Present Illness:  This is a 68 year old female who is previously known to Dr. Sharlett Iles. She had colonoscopy by Dr. Sharlett Iles in April 2014 at which time her study was normal but it was recommended that she have another procedure in 5 years due to family history of colon cancer in her father. In Dr. Buel Ream absence she is requesting Dr. Hilarie Fredrickson to be her new physician. She presents to our office today with her husband to discuss what she thinks is acid reflux. She says that she has been on omeprazole 20 mg daily for quite some time. Recently she has been experiencing a lot of irritation and soreness in her throat; she also describes the discomfort as a dull ache. Says that goes into her neck and to her head and into her shoulder blades at times. She complains of reflux coming up a lot of times at night time but sometimes during the day as well. There is some epigastric abdominal discomfort. She sometimes feels as if it is hard to swallow like there something is swollen. She denies any actual food getting stuck. She admits that her symptoms seem to be worse with certain foods such as spicy foods and green peppers so she does try to avoid those. Recently her PCP had placed her on pantoprazole 40 mg daily in place of her omeprazole. She's been taking that for about one month and has not noticed much difference. She is requesting to increase the medication to twice a day to see if that helps her any further. She had labs drawn by her PCP's office including a CBC, CMP, and TSH as well as a vitamin B12 level which were all within normal limits. She was also checked for H. pylori IgG antibody. This result was equivocal, therefore, she was not treated by her PCP and was told to follow up here.  Her last EGD was in May 2010 at which time the study was normal except for a hiatal hernia.   Current Medications, Allergies, Past Medical  History, Past Surgical History, Family History and Social History were reviewed in [employer] record.   Physical Exam: BP 118/70  Pulse 78  Ht 5\' 3"  (1.6 m)  Wt 140 lb 9.6 oz (63.776 kg)  BMI 24.91 kg/m2 General: Well developed white female in no acute distress Head: Normocephalic and atraumatic Eyes:  Sclerae anicteric, conjunctiva pink  Ears: Normal auditory acuity Lungs: Clear throughout to auscultation Heart: Regular rate and rhythm Abdomen: Soft, non-distended.  Normal bowel sounds.  Non-tender. Musculoskeletal: Symmetrical with no gross deformities  Extremities: No edema  Neurological: Alert oriented x 4, grossly non-focal Psychological:  Alert and cooperative. Normal mood and affect  Assessment and Recommendations: -GERD with epigastric discomfort, pain in her throat, and somewhat of a globus sensation:  Will increase her pantoprazole to 40 mg BID.  Will schedule EGD as well with Dr. Hilarie Fredrickson at her request.  I will ask that he makes sure to biopsy for Hpylori during the procedure due to her equivocal lab results before treatment.  The risks, benefits, and alternatives were discussed with the patient and she consents to proceed.   Addendum: Reviewed and agree with initial management. Jerene Bears, MD

## 2013-11-14 NOTE — Patient Instructions (Signed)
You have been scheduled for an endoscopy. Please follow written instructions given to you at your visit today. If you use inhalers (even only as needed), please bring them with you on the day of your procedure. Your physician has requested that you go to www.startemmi.com and enter the access code given to you at your visit today. This web site gives a general overview about your procedure. However, you should still follow specific instructions given to you by our office regarding your preparation for the procedure.  Please increase Pantoprazole to twice daily New prescription was sent to your pharmacy

## 2013-12-07 ENCOUNTER — Encounter: Payer: Self-pay | Admitting: Internal Medicine

## 2013-12-07 ENCOUNTER — Ambulatory Visit (AMBULATORY_SURGERY_CENTER): Payer: Medicare HMO | Admitting: Internal Medicine

## 2013-12-07 VITALS — BP 143/88 | HR 76 | Temp 97.5°F | Resp 21 | Ht 63.0 in | Wt 140.0 lb

## 2013-12-07 DIAGNOSIS — K219 Gastro-esophageal reflux disease without esophagitis: Secondary | ICD-10-CM

## 2013-12-07 DIAGNOSIS — R768 Other specified abnormal immunological findings in serum: Secondary | ICD-10-CM

## 2013-12-07 DIAGNOSIS — R760 Raised antibody titer: Secondary | ICD-10-CM

## 2013-12-07 DIAGNOSIS — K2950 Unspecified chronic gastritis without bleeding: Secondary | ICD-10-CM

## 2013-12-07 HISTORY — PX: COLONOSCOPY: SHX174

## 2013-12-07 MED ORDER — SODIUM CHLORIDE 0.9 % IV SOLN: 500.0000 mL | INTRAVENOUS | Status: DC

## 2013-12-07 NOTE — Progress Notes (Signed)
Called to room to assist during endoscopic procedure.  Patient ID and intended procedure confirmed with present staff. Received instructions for my participation in the procedure from the performing physician.  

## 2013-12-07 NOTE — Op Note (Signed)
[facility]  [address] [place], [id number]   ENDOSCOPY PROCEDURE REPORT  PATIENT: Brooke [name], Brooke [name]  MR#: [id number] BIRTHDATE: 1946/01/11 , 22  yrs. old GENDER: female ENDOSCOPIST: Jerene Bears, MD PROCEDURE DATE:  12/07/2013 PROCEDURE:  EGD w/ biopsy for H.pylori ASA CLASS:     Class II INDICATIONS:  history of GERD and heartburn. equivocal H. pylori antibody test MEDICATIONS: Monitored anesthesia care and Propofol 100 mg IV; lidocaine 60 mg IV TOPICAL ANESTHETIC: none  DESCRIPTION OF PROCEDURE: After the risks benefits and alternatives of the procedure were thoroughly explained, informed consent was obtained.  The LB [id number] endoscope was introduced through the mouth and advanced to the second portion of the duodenum , Without limitations.  The instrument was slowly withdrawn as the mucosa was fully examined.  ESOPHAGUS: The mucosa of the esophagus appeared normal.  Z-line regular at 37 cm.  STOMACH: A 2 cm hiatal hernia was noted.   The mucosa of the stomach appeared normal.  Cold forcep biopsies were taken at the gastric body, antrum and angularis to evaluate for h.  pylori.  DUODENUM: A small erosion was found in the duodenal bulb. Otherwise, the duodenal mucosa showed no abnormalities in the bulb and 2nd part of the duodenum.  Retroflexed views revealed a hiatal hernia.     The scope was then withdrawn from the patient and the procedure completed.  COMPLICATIONS: There were no immediate complications.  ENDOSCOPIC IMPRESSION: 1.   The mucosa of the esophagus appeared normal 2.   2 cm hiatal hernia 3.   The mucosa of the stomach appeared normal 4.   Small erosion was found in the duodenal bulb 5.   Otherwise the duodenal mucosa showed no abnormalities in the bulb and 2nd part of the duodenum  RECOMMENDATIONS: 1.  Await biopsy results 2.  Continue taking your PPI (pantoprazole 40 mg) twice daily.  It is best to be taken 20-30  minutes prior to a meal. 3.  Follow-up of helicobacter pylori status, treat if indicated  eSigned:  Jerene Bears, MD 12/07/2013 10:43 AM   CC:The Patient and Tamsen Roers, MD

## 2013-12-07 NOTE — Patient Instructions (Signed)
YOU HAD AN ENDOSCOPIC PROCEDURE TODAY AT [facility]: Refer to the procedure report that was given to you for any specific questions about what was found during the examination.  If the procedure report does not answer your questions, please call your gastroenterologist to clarify.  If you requested that your care partner not be given the details of your procedure findings, then the procedure report has been included in a sealed envelope for you to review at your convenience later.  YOU SHOULD EXPECT: Some feelings of bloating in the abdomen. Passage of more gas than usual.  Walking can help get rid of the air that was put into your GI tract during the procedure and reduce the bloating. If you had a lower endoscopy (such as a colonoscopy or flexible sigmoidoscopy) you may notice spotting of blood in your stool or on the toilet paper. If you underwent a bowel prep for your procedure, then you may not have a normal bowel movement for a few days.  DIET: Your first meal following the procedure should be a light meal and then it is ok to progress to your normal diet.  A half-sandwich or bowl of soup is an example of a good first meal.  Heavy or fried foods are harder to digest and may make you feel nauseous or bloated.  Likewise meals heavy in dairy and vegetables can cause extra gas to form and this can also increase the bloating.  Drink plenty of fluids but you should avoid alcoholic beverages for 24 hours.  ACTIVITY: Your care partner should take you home directly after the procedure.  You should plan to take it easy, moving slowly for the rest of the day.  You can resume normal activity the day after the procedure however you should NOT DRIVE or use heavy machinery for 24 hours (because of the sedation medicines used during the test).    SYMPTOMS TO REPORT IMMEDIATELY: A gastroenterologist can be reached at any hour.  During normal business hours, 8:30 AM to 5:00 PM Monday through Friday,  call [phone].  After hours and on weekends, please call the GI answering service at [phone] who will take a message and have the physician on call contact you.   F  Vomiting of blood or coffee ground material  New chest pain or pain under the shoulder blades  Painful or persistently difficult swallowing  New shortness of breath  Fever of 100F or higher  Black, tarry-looking stools  FOLLOW UP: If any biopsies were taken you will be contacted by phone or by letter within the next 1-3 weeks.  Call your gastroenterologist if you have not heard about the biopsies in 3 weeks.  Our staff will call the home number listed on your records the next business day following your procedure to check on you and address any questions or concerns that you may have at that time regarding the information given to you following your procedure. This is a courtesy call and so if there is no answer at the home number and we have not heard from you through the emergency physician on call, we will assume that you have returned to your regular daily activities without incident.  SIGNATURES/CONFIDENTIALITY: You and/or your care partner have signed paperwork which will be entered into your electronic medical record.  These signatures attest to the fact that that the information above on your After Visit Summary has been reviewed and is understood.  Full responsibility of the confidentiality  of this discharge information lies with you and/or your care-partner.    AWAIT BIOPSY RESULTS

## 2013-12-10 ENCOUNTER — Telehealth: Payer: Self-pay | Admitting: *Deleted

## 2013-12-10 NOTE — Telephone Encounter (Signed)
Number identifier, left message, follow-up  

## 2013-12-12 ENCOUNTER — Encounter: Payer: Self-pay | Admitting: Internal Medicine

## 2013-12-26 ENCOUNTER — Ambulatory Visit: Payer: Medicare Other | Admitting: Internal Medicine

## 2014-07-25 ENCOUNTER — Encounter: Payer: Self-pay | Admitting: Gastroenterology

## 2014-12-27 ENCOUNTER — Telehealth: Payer: Self-pay

## 2014-12-27 MED ORDER — PANTOPRAZOLE SODIUM 40 MG PO TBEC: 40.0000 mg | DELAYED_RELEASE_TABLET | Freq: Two times a day (BID) | ORAL | Status: DC

## 2014-12-27 NOTE — Telephone Encounter (Signed)
Incoming fax request for Pantoprazole 40mg  BID #[id number]  from [facility]. Pt was seen a year ago in office and had a EGD, pt has no upcoming follow up. Please advise.

## 2014-12-27 NOTE — Telephone Encounter (Signed)
Ok to refill 

## 2014-12-27 NOTE — Telephone Encounter (Signed)
Rx sent to pt's pharmacy

## 2015-07-31 ENCOUNTER — Encounter: Payer: Self-pay | Admitting: *Deleted

## 2015-08-11 ENCOUNTER — Other Ambulatory Visit: Payer: Self-pay | Admitting: Obstetrics & Gynecology

## 2015-08-11 DIAGNOSIS — E2839 Other primary ovarian failure: Secondary | ICD-10-CM

## 2015-08-19 ENCOUNTER — Ambulatory Visit (INDEPENDENT_AMBULATORY_CARE_PROVIDER_SITE_OTHER): Payer: Medicare HMO | Admitting: Internal Medicine

## 2015-08-19 ENCOUNTER — Other Ambulatory Visit (INDEPENDENT_AMBULATORY_CARE_PROVIDER_SITE_OTHER): Payer: Medicare HMO

## 2015-08-19 ENCOUNTER — Encounter: Payer: Self-pay | Admitting: Internal Medicine

## 2015-08-19 VITALS — BP 140/90 | HR 64 | Ht 63.0 in | Wt 140.0 lb

## 2015-08-19 DIAGNOSIS — K219 Gastro-esophageal reflux disease without esophagitis: Secondary | ICD-10-CM | POA: Diagnosis not present

## 2015-08-19 DIAGNOSIS — R1013 Epigastric pain: Secondary | ICD-10-CM | POA: Diagnosis not present

## 2015-08-19 LAB — CBC WITH DIFFERENTIAL/PLATELET
Basophils Absolute: 0 10*3/uL (ref 0.0–0.1)
Basophils Relative: 0.4 % (ref 0.0–3.0)
Eosinophils Absolute: 0.1 10*3/uL (ref 0.0–0.7)
Eosinophils Relative: 0.8 % (ref 0.0–5.0)
HCT: 37.6 % (ref 36.0–46.0)
Hemoglobin: 12.6 g/dL (ref 12.0–15.0)
Lymphocytes Relative: 26.5 % (ref 12.0–46.0)
Lymphs Abs: 2.4 10*3/uL (ref 0.7–4.0)
MCHC: 33.6 g/dL (ref 30.0–36.0)
MCV: 93.4 fl (ref 78.0–100.0)
Monocytes Absolute: 0.9 10*3/uL (ref 0.1–1.0)
Monocytes Relative: 9.5 % (ref 3.0–12.0)
Neutro Abs: 5.7 10*3/uL (ref 1.4–7.7)
Neutrophils Relative %: 62.8 % (ref 43.0–77.0)
Platelets: 298 10*3/uL (ref 150.0–400.0)
RBC: 4.02 Mil/uL (ref 3.87–5.11)
RDW: 13 % (ref 11.5–15.5)
WBC: 9 10*3/uL (ref 4.0–10.5)

## 2015-08-19 LAB — COMPREHENSIVE METABOLIC PANEL
ALT: 12 U/L (ref 0–35)
AST: 21 U/L (ref 0–37)
Albumin: 4.5 g/dL (ref 3.5–5.2)
Alkaline Phosphatase: 67 U/L (ref 39–117)
BUN: 11 mg/dL (ref 6–23)
CO2: 29 mEq/L (ref 19–32)
Calcium: 10.2 mg/dL (ref 8.4–10.5)
Chloride: 104 mEq/L (ref 96–112)
Creatinine, Ser: 0.8 mg/dL (ref 0.40–1.20)
GFR: 75.52 mL/min (ref >60.00)
Glucose, Bld: 92 mg/dL (ref 70–99)
Potassium: 5.4 mEq/L — ABNORMAL HIGH (ref 3.5–5.1)
Sodium: 139 mEq/L (ref 135–145)
Total Bilirubin: 0.4 mg/dL (ref 0.2–1.2)
Total Protein: 7.7 g/dL (ref 6.0–8.3)

## 2015-08-19 MED ORDER — RANITIDINE HCL 150 MG PO TABS: 150.0000 mg | ORAL_TABLET | Freq: Every evening | ORAL | Status: DC

## 2015-08-19 MED ORDER — PANTOPRAZOLE SODIUM 40 MG PO TBEC: 40.0000 mg | DELAYED_RELEASE_TABLET | ORAL | Status: DC

## 2015-08-19 NOTE — Progress Notes (Signed)
Subjective:    Patient ID: Brooke [name], female    DOB: [birth year], 70 y.o.   MRN: [id number]  HPI Brooke [name] is a 70 year old female with a past medical history of GERD, gastritis, and family history of colon cancer who is here for follow-up. She is alone today. She is known to me from an upper endoscopy performed in October 2015 to evaluate GERD heartburn and equivocal H. pylori antibody test. Upper endoscopy revealed a normal esophagus, 2 cm hiatal hernia. Normal gastric mucosa. Small duodenal bulb erosion and otherwise normal exam. 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 She also has a history of hyperlipidemia, dysthymic disorder and spastic torticollis. Follows with Dr. Tamsen Roers.  Most recently she reports that intermittently she has epigastric abdominal pain. This tends to be at night and wakes her from sleep. Last several hours. Doesn't seem to radiate. She states she gets "walks the floor" until the pain goes away. Does not respond to Tums. Does not feel like traditional heartburn. She doesn't have intermittent traditional heartburn which is burning substernal chest discomfort. She has been taking pantoprazole 40 mg daily but at times when she is having worsening heartburn she takes it twice daily. She reports she will also "layoff" the medication entirely until symptoms return. This is usually within a month. When this occurs she often needs the medication twice daily. She denies nausea or vomiting. Reports regular bowel movements without blood in her stool or melena. She denies dysphagia and odynophagia.  Her last colonoscopy was 4-14 with Dr. Sharlett Iles which was normal. 5 year surveillance was recommended due to family history.  Review of Systems As per history of present illness, otherwise negative  Current Medications, Allergies, Past Medical History, Past Surgical History, Family History and Social History were  reviewed in [employer] record.     Objective:   Physical Exam BP 140/90 mmHg  Pulse 64  Ht 5\' 3"  (1.6 m)  Wt 140 lb (63.504 kg)  BMI 24.81 kg/m2 Constitutional: Well-developed and well-nourished. No distress. HEENT: Normocephalic and atraumatic. Oropharynx is clear and moist. No oropharyngeal exudate. Conjunctivae are normal.  No scleral icterus. Neck: Neck supple. Trachea midline. Cardiovascular: Normal rate, regular rhythm and intact distal pulses. No M/R/G Pulmonary/chest: Effort normal and breath sounds normal. No wheezing, rales or rhonchi. Abdominal: Soft, nontender, nondistended. Bowel sounds active throughout. There are no masses palpable. No hepatosplenomegaly. Extremities: no clubbing, cyanosis, or edema Neurological: Alert and oriented to person place and time. Skin: Skin is warm and dry. No rashes noted. Psychiatric: Normal mood and affect. Behavior is normal.      Assessment & Plan:  70 year old female with a past medical history of GERD, gastritis, and family history of colon cancer who is here for follow-up.   1. GERD -- some symptoms classic for traditional heartburn. Likely exacerbated by small hiatal hernia. No history of Barrett's esophagus on endoscopy within the last 2 years. I have advised that she would likely do better on consistent therapy rather than changing doses and stopping altogether sporadically. I recommended pantoprazole 40 mg 30 minutes before breakfast and ranitidine 150 mg in the evening. Symptoms remain uncontrolled she is advised to notify me. She voices understanding  2. Epigastric pain -- sporadic and seems to be worse at night. Question of gallbladder pathology. Abdominal ultrasound recommended to evaluate for and rule out gallstones. No findings at endoscopy to further explain epigastric pain unless it is related to functional  dyspepsia.  3. CRC screening -- elevated due to family history. Repeat colonoscopy recommended  April 2019  25 minutes spent with the patient today. Greater than 50% was spent in counseling and coordination of care with the patient

## 2015-08-19 NOTE — Patient Instructions (Signed)
Your physician has requested that you go to the basement for the following lab work before leaving today: CBC, CMP  You have been scheduled for an abdominal ultrasound at [facility] (1st floor of hospital) on Friday 08/22/15 at 8:30 am. Please arrive 15 minutes prior to your appointment for registration. Make certain not to have anything to eat or drink 6 hours prior to your appointment. Should you need to reschedule your appointment, please contact radiology at [phone]. This test typically takes about 30 minutes to perform.  We have sent the following medications to your pharmacy for you to pick up at your convenience: Pantoprazole 40 mg every morning before breakfast Ranitidine 150 mg every evening  Please follow up with Dr Hilarie Fredrickson in 1 year.  If you are age 69 or older, your body mass index should be between 23-30. Your Body mass index is 24.81 kg/(m^2). If this is out of the aforementioned range listed, please consider follow up with your Primary Care Provider.  If you are age 18 or younger, your body mass index should be between 19-25. Your Body mass index is 24.81 kg/(m^2). If this is out of the aformentioned range listed, please consider follow up with your Primary Care Provider.

## 2015-08-20 ENCOUNTER — Other Ambulatory Visit: Payer: Self-pay

## 2015-08-20 DIAGNOSIS — E875 Hyperkalemia: Secondary | ICD-10-CM

## 2015-08-22 ENCOUNTER — Ambulatory Visit (HOSPITAL_COMMUNITY)
Admission: RE | Admit: 2015-08-22 | Discharge: 2015-08-22 | Disposition: A | Discharge status: Home / Self Care | Payer: Medicare HMO | Source: Ambulatory Visit | Attending: Internal Medicine | Admitting: Internal Medicine

## 2015-08-22 ENCOUNTER — Other Ambulatory Visit (INDEPENDENT_AMBULATORY_CARE_PROVIDER_SITE_OTHER): Payer: Medicare HMO

## 2015-08-22 DIAGNOSIS — K7689 Other specified diseases of liver: Secondary | ICD-10-CM | POA: Diagnosis not present

## 2015-08-22 DIAGNOSIS — K219 Gastro-esophageal reflux disease without esophagitis: Secondary | ICD-10-CM | POA: Insufficient documentation

## 2015-08-22 DIAGNOSIS — K8020 Calculus of gallbladder without cholecystitis without obstruction: Secondary | ICD-10-CM | POA: Diagnosis not present

## 2015-08-22 DIAGNOSIS — R1013 Epigastric pain: Secondary | ICD-10-CM | POA: Diagnosis present

## 2015-08-22 DIAGNOSIS — E875 Hyperkalemia: Secondary | ICD-10-CM | POA: Diagnosis not present

## 2015-08-22 LAB — BASIC METABOLIC PANEL
BUN: 11 mg/dL (ref 6–23)
CO2: 27 mEq/L (ref 19–32)
Calcium: 9.7 mg/dL (ref 8.4–10.5)
Chloride: 100 mEq/L (ref 96–112)
Creatinine, Ser: 0.88 mg/dL (ref 0.40–1.20)
GFR: 67.65 mL/min (ref >60.00)
Glucose, Bld: 96 mg/dL (ref 70–99)
Potassium: 4.8 mEq/L (ref 3.5–5.1)
Sodium: 136 mEq/L (ref 135–145)

## 2015-08-26 ENCOUNTER — Ambulatory Visit
Admission: RE | Admit: 2015-08-26 | Discharge: 2015-08-26 | Disposition: A | Discharge status: Home / Self Care | Payer: Medicare HMO | Source: Ambulatory Visit | Attending: Obstetrics & Gynecology | Admitting: Obstetrics & Gynecology

## 2015-08-26 DIAGNOSIS — E2839 Other primary ovarian failure: Secondary | ICD-10-CM

## 2015-08-29 ENCOUNTER — Telehealth: Payer: Self-pay | Admitting: Internal Medicine

## 2015-08-29 NOTE — Telephone Encounter (Signed)
Pt notified that the results have not been reviewed by Dr Hilarie Fredrickson as of today, we will call her as soon as available.

## 2015-09-17 ENCOUNTER — Ambulatory Visit: Payer: Self-pay | Admitting: Surgery

## 2015-10-21 ENCOUNTER — Encounter (HOSPITAL_COMMUNITY): Payer: Self-pay

## 2015-10-23 NOTE — Patient Instructions (Addendum)
JAKITA WIRE  10/23/2015   Your procedure is scheduled on: 10-28-15  Report to [facility] Main  Entrance take [facility]  elevators to 3rd floor to  [place] at 530  AM.  Call this number if you have problems the morning of surgery [phone]   Remember: ONLY 1 PERSON MAY GO WITH YOU TO SHORT STAY TO GET  READY MORNING OF [facility].  Do not eat food or drink liquids :After Midnight.     Take these medicines the morning of surgery with A SIP OF WATER: HYDROCODONE IF NEEDED,  ROSUVASTATIN(CRESTOR)              You may not have any metal on your body including hair pins and              piercings  Do not wear jewelry, make-up, lotions, powders or perfumes, deodorant             Do not wear nail polish.  Do not shave  48 hours prior to surgery.              Men may shave face and neck.   Do not bring valuables to the hospital. [place].  Contacts, dentures or bridgework may not be worn into surgery.  Leave suitcase in the car. After surgery it may be brought to your room.     Patients discharged the day of surgery will not be allowed to drive home.  Name and phone number of your driver:  Special Instructions: N/A              Please read over the following fact sheets you were given: _____________________________________________________________________             [facility] - Preparing for Surgery Before surgery, you can play an important role.  Because skin is not sterile, your skin needs to be as free of germs as possible.  You can reduce the number of germs on your skin by washing with CHG (chlorahexidine gluconate) soap before surgery.  CHG is an antiseptic cleaner which kills germs and bonds with the skin to continue killing germs even after washing. Please DO NOT use if you have an allergy to CHG or antibacterial soaps.  If your skin becomes reddened/irritated stop using the CHG and  inform your nurse when you arrive at Short Stay. Do not shave (including legs and underarms) for at least 48 hours prior to the first CHG shower.  You may shave your face/neck. Please follow these instructions carefully:  1.  Shower with CHG Soap the night before surgery and the  morning of Surgery.  2.  If you choose to wash your hair, wash your hair first as usual with your  normal  shampoo.  3.  After you shampoo, rinse your hair and body thoroughly to remove the  shampoo.                           4.  Use CHG as you would any other liquid soap.  You can apply chg directly  to the skin and wash                       Gently with  a scrungie or clean washcloth.  5.  Apply the CHG Soap to your body ONLY FROM THE NECK DOWN.   Do not use on face/ open                           Wound or open sores. Avoid contact with eyes, ears mouth and genitals (private parts).                       Wash face,  Genitals (private parts) with your normal soap.             6.  Wash thoroughly, paying special attention to the area where your surgery  will be performed.  7.  Thoroughly rinse your body with warm water from the neck down.  8.  DO NOT shower/wash with your normal soap after using and rinsing off  the CHG Soap.                9.  Pat yourself dry with a clean towel.            10.  Wear clean pajamas.            11.  Place clean sheets on your bed the night of your first shower and do not  sleep with pets. Day of Surgery : Do not apply any lotions/deodorants the morning of surgery.  Please wear clean clothes to the hospital/surgery center.  FAILURE TO FOLLOW THESE INSTRUCTIONS MAY RESULT IN THE CANCELLATION OF YOUR SURGERY PATIENT SIGNATURE_________________________________  NURSE SIGNATURE__________________________________  ________________________________________________________________________

## 2015-10-24 ENCOUNTER — Encounter (HOSPITAL_COMMUNITY): Payer: Self-pay

## 2015-10-24 ENCOUNTER — Encounter (HOSPITAL_COMMUNITY)
Admission: RE | Admit: 2015-10-24 | Discharge: 2015-10-24 | Disposition: A | Discharge status: Home / Self Care | Payer: Medicare HMO | Source: Ambulatory Visit | Attending: Surgery | Admitting: Surgery

## 2015-10-24 DIAGNOSIS — Z01818 Encounter for other preprocedural examination: Secondary | ICD-10-CM | POA: Insufficient documentation

## 2015-10-24 HISTORY — DX: Cardiac arrhythmia, unspecified: I49.9

## 2015-10-24 HISTORY — DX: Adverse effect of unspecified anesthetic, initial encounter: T41.45XA

## 2015-10-24 HISTORY — DX: Headache, unspecified: R51.9

## 2015-10-24 HISTORY — DX: Headache: R51

## 2015-10-24 HISTORY — DX: Other complications of anesthesia, initial encounter: T88.59XA

## 2015-10-24 HISTORY — DX: Hypothyroidism, unspecified: E03.9

## 2015-10-24 HISTORY — DX: Reserved for inherently not codable concepts without codable children: IMO0001

## 2015-10-24 LAB — CBC
HCT: 38.2 % (ref 36.0–46.0)
Hemoglobin: 12.6 g/dL (ref 12.0–15.0)
MCH: 31 pg (ref 26.0–34.0)
MCHC: 33 g/dL (ref 30.0–36.0)
MCV: 93.9 fL (ref 78.0–100.0)
Platelets: 304 10*3/uL (ref 150–400)
RBC: 4.07 MIL/uL (ref 3.87–5.11)
RDW: 13.4 % (ref 11.5–15.5)
WBC: 8.3 10*3/uL (ref 4.0–10.5)

## 2015-10-24 NOTE — Progress Notes (Signed)
STRESS TEST 04-07-15 WITH RESTING EKG ON CHART

## 2015-10-27 ENCOUNTER — Encounter (HOSPITAL_COMMUNITY): Payer: Self-pay | Admitting: Surgery

## 2015-10-27 DIAGNOSIS — K8010 Calculus of gallbladder with chronic cholecystitis without obstruction: Secondary | ICD-10-CM | POA: Diagnosis present

## 2015-10-27 NOTE — H&P (Signed)
General Surgery [facility]  Brooke [name] DOB: [birth year] Married / Language: English / Race: White Female  History of Present Illness  Patient words: gallbladder.  The patient is a 70 year old female who presents for evaluation of gall stones.  Patient is referred by Dr. Zenovia Jarred for surgical management of chronic cholecystitis and cholelithiasis. Patient's primary care physician is Dr. Tamsen Roers. Patient has had long-standing symptoms of gastroesophageal reflux and epigastric abdominal pain. Patient underwent an ultrasound in July 2017 which showed layering sludge and gallstones. There was no sign of acute inflammation. Patient has had no prior history of hepatobiliary or pancreatic disease. She has undergone laparoscopic surgery by her gynecologist and has had open total abdominal hysterectomy and appendectomy. Patient notes intermittent epigastric abdominal pain occasionally radiating to the back. This is associated with nausea. She denies emesis. She notes symptoms are caused frequently by fatty or greasy food. There is no family history of biliary disease. Patient denies jaundice or acholic stools. She presents today to discuss cholecystectomy.   Other Problems Anxiety Disorder Arthritis Back Pain Gastroesophageal Reflux Disease Hypercholesterolemia Migraine Headache  Past Surgical History Colon Polyp Removal - Colonoscopy Hysterectomy (not due to cancer) - Complete  Diagnostic Studies History Colonoscopy 5-10 years ago Mammogram within last year Pap Smear 1-5 years ago  Allergies No Known Drug Allergies08/10/2015  Medication History Hydrocodone-Acetaminophen (7.5-325MG  Tablet, Oral) Active. ALPRAZolam (0.5MG  Tablet, Oral) Active. RaNITidine HCl (150MG  Tablet, Oral) Active. Vitamin D (Cholecalciferol) (1000UNIT Capsule, Oral) Active. Multivitamin (Oral) Active. ZyrTEC (10MG  Tablet, Oral) Active. Crestor  (5MG  Tablet, Oral) Active. Medications Reconciled  Social History Alcohol use Moderate alcohol use. Caffeine use Carbonated beverages, Coffee. No drug use Tobacco use Never smoker.  Family History Arthritis Brother, Sister. Cancer Brother. Cervical Cancer Sister. Colon Cancer Father. Colon Polyps Brother, Father. Diabetes Mellitus Brother. Heart Disease Brother, Mother. Heart disease in female family member before age 69 Kidney Disease Brother. Prostate Cancer Father. Respiratory Condition Mother. Thyroid problems Brother, Mother, Sister.  Pregnancy / Birth History  Age at menarche 18 years. Age of menopause <46 Gravida 1 Maternal age 66-20 Para 1  Review of Systems  General Present- Fatigue. Not Present- Appetite Loss, Chills, Fever, Night Sweats, Weight Gain and Weight Loss. Skin Not Present- Change in Wart/Mole, Dryness, Hives, Jaundice, New Lesions, Non-Healing Wounds, Rash and Ulcer. HEENT Present- Seasonal Allergies and Wears glasses/contact lenses. Not Present- Earache, Hearing Loss, Hoarseness, Nose Bleed, Oral Ulcers, Ringing in the Ears, Sinus Pain, Sore Throat, Visual Disturbances and Yellow Eyes. Respiratory Present- Snoring. Not Present- Bloody sputum, Chronic Cough, Difficulty Breathing and Wheezing. Breast Not Present- Breast Mass, Breast Pain, Nipple Discharge and Skin Changes. Cardiovascular Present- Shortness of Breath. Not Present- Chest Pain, Difficulty Breathing Lying Down, Leg Cramps, Palpitations, Rapid Heart Rate and Swelling of Extremities. Gastrointestinal Present- Bloating, Gets full quickly at meals and Indigestion. Not Present- Abdominal Pain, Bloody Stool, Change in Bowel Habits, Chronic diarrhea, Constipation, Difficulty Swallowing, Excessive gas, Hemorrhoids, Nausea, Rectal Pain and Vomiting. Musculoskeletal Present- Back Pain and Muscle Weakness. Not Present- Joint Pain, Joint Stiffness, Muscle Pain and Swelling of  Extremities. Neurological Not Present- Decreased Memory, Fainting, Headaches, Numbness, Seizures, Tingling, Tremor, Trouble walking and Weakness. Psychiatric Not Present- Anxiety, Bipolar, Change in Sleep Pattern, Depression, Fearful and Frequent crying. Endocrine Not Present- Cold Intolerance, Excessive Hunger, Hair Changes, Heat Intolerance, Hot flashes and New Diabetes. Hematology Not Present- Blood Thinners, Easy Bruising, Excessive bleeding, Gland problems, HIV and Persistent Infections.  Vitals Weight: 136 lb  Height: 60in Body Surface Area: 1.58 m Body Mass Index: 26.56 kg/m  Pulse: 68 (Regular)  BP: 130/82 (Sitting, Left Arm, Standard)  Physical Exam The physical exam findings are as follows: Note:General - appears comfortable, no distress; not diaphorectic  HEENT - normocephalic; sclerae clear, gaze conjugate; mucous membranes moist, dentition good; voice normal  Neck - symmetric on extension; no palpable anterior or posterior cervical adenopathy; no palpable masses in the thyroid bed  Chest - clear bilaterally without rhonchi, rales, or wheeze  Cor - regular rhythm with normal rate; no significant murmur  Abd - soft without distension; well-healed surgical incision; mild tenderness to deep palpation in the epigastrium without palpable mass.  Ext - non-tender without significant edema or lymphedema  Neuro - grossly intact; no tremor   Assessment & Plan  CHRONIC CHOLECYSTITIS, CHOLELITHIASIS  Patient presents with symptomatic cholelithiasis and signs of chronic cholecystitis. She is provided with written literature on gallbladder surgery to review at home with her family.  I have recommended proceeding with laparoscopic cholecystectomy with intraoperative cholangiography. We discussed the small potential for conversion to open surgery. We discussed the hospital stay to be anticipated. We discussed her postoperative recovery and return to activity and normal  diet. Patient and her husband understand and wish to proceed in the near future. We will make arrangements for surgery to time convenient for her.  The risks and benefits of the procedure have been discussed at length with the patient. The patient understands the proposed procedure, potential alternative treatments, and the course of recovery to be expected. All of the patient's questions have been answered at this time. The patient wishes to proceed with surgery.  Earnstine Regal, MD, [facility] Office: [phone]

## 2015-10-28 ENCOUNTER — Ambulatory Visit (HOSPITAL_COMMUNITY): Payer: Medicare HMO

## 2015-10-28 ENCOUNTER — Ambulatory Visit (HOSPITAL_COMMUNITY): Payer: Medicare HMO | Admitting: Certified Registered"

## 2015-10-28 ENCOUNTER — Encounter (HOSPITAL_COMMUNITY)
Admission: RE | Disposition: A | Discharge status: Home / Self Care | Payer: Self-pay | Source: Ambulatory Visit | Attending: Surgery

## 2015-10-28 ENCOUNTER — Observation Stay (HOSPITAL_COMMUNITY)
Admission: RE | Admit: 2015-10-28 | Discharge: 2015-10-29 | Disposition: A | Discharge status: Home / Self Care | Payer: Medicare HMO | Source: Ambulatory Visit | Attending: Surgery | Admitting: Surgery

## 2015-10-28 ENCOUNTER — Encounter (HOSPITAL_COMMUNITY): Payer: Self-pay | Admitting: Certified Registered"

## 2015-10-28 DIAGNOSIS — M1990 Unspecified osteoarthritis, unspecified site: Secondary | ICD-10-CM | POA: Diagnosis not present

## 2015-10-28 DIAGNOSIS — K8020 Calculus of gallbladder without cholecystitis without obstruction: Secondary | ICD-10-CM | POA: Diagnosis present

## 2015-10-28 DIAGNOSIS — Z79891 Long term (current) use of opiate analgesic: Secondary | ICD-10-CM | POA: Diagnosis not present

## 2015-10-28 DIAGNOSIS — K8010 Calculus of gallbladder with chronic cholecystitis without obstruction: Principal | ICD-10-CM | POA: Diagnosis present

## 2015-10-28 DIAGNOSIS — F419 Anxiety disorder, unspecified: Secondary | ICD-10-CM | POA: Diagnosis not present

## 2015-10-28 DIAGNOSIS — E7800 Pure hypercholesterolemia, unspecified: Secondary | ICD-10-CM | POA: Insufficient documentation

## 2015-10-28 DIAGNOSIS — K219 Gastro-esophageal reflux disease without esophagitis: Secondary | ICD-10-CM | POA: Diagnosis not present

## 2015-10-28 DIAGNOSIS — Z79899 Other long term (current) drug therapy: Secondary | ICD-10-CM | POA: Insufficient documentation

## 2015-10-28 DIAGNOSIS — Z419 Encounter for procedure for purposes other than remedying health state, unspecified: Secondary | ICD-10-CM

## 2015-10-28 HISTORY — PX: LAPAROSCOPIC CHOLECYSTECTOMY SINGLE SITE WITH INTRAOPERATIVE CHOLANGIOGRAM: SHX6538

## 2015-10-28 SURGERY — LAPAROSCOPIC CHOLECYSTECTOMY SINGLE SITE WITH INTRAOPERATIVE CHOLANGIOGRAM
Anesthesia: General | Site: Abdomen

## 2015-10-28 MED ORDER — HYDROCODONE-ACETAMINOPHEN 5-325 MG PO TABS
1.0000 | ORAL_TABLET | ORAL | Status: DC | PRN
  Administered 2015-10-28 – 2015-10-29 (×3): 2 via ORAL
  Filled 2015-10-28 (×3): qty 2

## 2015-10-28 MED ORDER — ONDANSETRON 4 MG PO TBDP
4.0000 mg | ORAL_TABLET | Freq: Four times a day (QID) | ORAL | Status: DC | PRN
  Administered 2015-10-29: 4 mg via ORAL
  Filled 2015-10-28: qty 1

## 2015-10-28 MED ORDER — FENTANYL CITRATE (PF) 100 MCG/2ML IJ SOLN: 25.0000 ug | INTRAMUSCULAR | Status: DC | PRN

## 2015-10-28 MED ORDER — ROCURONIUM BROMIDE 10 MG/ML (PF) SYRINGE
PREFILLED_SYRINGE | INTRAVENOUS | Status: DC | PRN
  Administered 2015-10-28: 40 mg via INTRAVENOUS

## 2015-10-28 MED ORDER — ONDANSETRON HCL 4 MG/2ML IJ SOLN
4.0000 mg | Freq: Once | INTRAMUSCULAR | Status: DC | PRN
Start: 2015-10-28 — End: 2015-10-28

## 2015-10-28 MED ORDER — FLUTICASONE PROPIONATE 50 MCG/ACT NA SUSP
1.0000 | Freq: Every evening | NASAL | Status: DC
  Filled 2015-10-28: qty 16

## 2015-10-28 MED ORDER — KCL IN DEXTROSE-NACL 20-5-0.45 MEQ/L-%-% IV SOLN
INTRAVENOUS | Status: DC
  Administered 2015-10-28: 11:00:00 via INTRAVENOUS
  Filled 2015-10-28 (×2): qty 1000

## 2015-10-28 MED ORDER — FENTANYL CITRATE (PF) 100 MCG/2ML IJ SOLN
INTRAMUSCULAR | Status: DC | PRN
  Administered 2015-10-28 (×2): 50 ug via INTRAVENOUS
  Administered 2015-10-28: 100 ug via INTRAVENOUS

## 2015-10-28 MED ORDER — PROPOFOL 10 MG/ML IV BOLUS
INTRAVENOUS | Status: AC
  Filled 2015-10-28: qty 20

## 2015-10-28 MED ORDER — CEFAZOLIN SODIUM-DEXTROSE 2-4 GM/100ML-% IV SOLN
2.0000 g | INTRAVENOUS | Status: AC
  Administered 2015-10-28: 2 g via INTRAVENOUS
  Filled 2015-10-28: qty 100

## 2015-10-28 MED ORDER — LIDOCAINE 2% (20 MG/ML) 5 ML SYRINGE
INTRAMUSCULAR | Status: AC
  Filled 2015-10-28: qty 5

## 2015-10-28 MED ORDER — ONDANSETRON HCL 4 MG/2ML IJ SOLN
INTRAMUSCULAR | Status: AC
  Filled 2015-10-28: qty 2

## 2015-10-28 MED ORDER — DEXAMETHASONE SODIUM PHOSPHATE 10 MG/ML IJ SOLN
INTRAMUSCULAR | Status: AC
  Filled 2015-10-28: qty 1

## 2015-10-28 MED ORDER — LACTATED RINGERS IV SOLN
INTRAVENOUS | Status: DC | PRN
  Administered 2015-10-28: 07:00:00 via INTRAVENOUS

## 2015-10-28 MED ORDER — MIDAZOLAM HCL 5 MG/5ML IJ SOLN
INTRAMUSCULAR | Status: DC | PRN
  Administered 2015-10-28: 2 mg via INTRAVENOUS

## 2015-10-28 MED ORDER — BUPIVACAINE-EPINEPHRINE 0.5% -1:200000 IJ SOLN
INTRAMUSCULAR | Status: DC | PRN
  Administered 2015-10-28: 20 mL

## 2015-10-28 MED ORDER — MIDAZOLAM HCL 2 MG/2ML IJ SOLN
INTRAMUSCULAR | Status: AC
  Filled 2015-10-28: qty 2

## 2015-10-28 MED ORDER — LACTATED RINGERS IV SOLN
INTRAVENOUS | Status: DC
  Administered 2015-10-28: 10:00:00 via INTRAVENOUS

## 2015-10-28 MED ORDER — CHLORHEXIDINE GLUCONATE CLOTH 2 % EX PADS: 6.0000 | MEDICATED_PAD | Freq: Once | CUTANEOUS | Status: DC

## 2015-10-28 MED ORDER — BUPIVACAINE-EPINEPHRINE (PF) 0.5% -1:200000 IJ SOLN
INTRAMUSCULAR | Status: AC
  Filled 2015-10-28: qty 30

## 2015-10-28 MED ORDER — LIDOCAINE 2% (20 MG/ML) 5 ML SYRINGE
INTRAMUSCULAR | Status: DC | PRN
  Administered 2015-10-28: 20 mg via INTRAVENOUS

## 2015-10-28 MED ORDER — IOPAMIDOL (ISOVUE-300) INJECTION 61%
INTRAVENOUS | Status: AC
  Filled 2015-10-28: qty 50

## 2015-10-28 MED ORDER — ALPRAZOLAM 0.5 MG PO TABS
0.5000 mg | ORAL_TABLET | Freq: Every evening | ORAL | Status: DC | PRN
Start: 2015-10-28 — End: 2015-10-29

## 2015-10-28 MED ORDER — ONDANSETRON HCL 4 MG/2ML IJ SOLN
4.0000 mg | Freq: Four times a day (QID) | INTRAMUSCULAR | Status: DC | PRN
  Administered 2015-10-28: 4 mg via INTRAVENOUS
  Filled 2015-10-28: qty 2

## 2015-10-28 MED ORDER — IOPAMIDOL (ISOVUE-300) INJECTION 61%
INTRAVENOUS | Status: DC | PRN
Start: 2015-10-28 — End: 2015-10-28
  Administered 2015-10-28: 5 mL

## 2015-10-28 MED ORDER — HYDROMORPHONE HCL 1 MG/ML IJ SOLN
1.0000 mg | INTRAMUSCULAR | Status: DC | PRN
  Administered 2015-10-28 (×2): 1 mg via INTRAVENOUS
  Filled 2015-10-28 (×2): qty 1

## 2015-10-28 MED ORDER — 0.9 % SODIUM CHLORIDE (POUR BTL) OPTIME
TOPICAL | Status: DC | PRN
  Administered 2015-10-28: 1000 mL

## 2015-10-28 MED ORDER — ONDANSETRON HCL 4 MG/2ML IJ SOLN
INTRAMUSCULAR | Status: DC | PRN
  Administered 2015-10-28: 4 mg via INTRAVENOUS

## 2015-10-28 MED ORDER — DEXAMETHASONE SODIUM PHOSPHATE 10 MG/ML IJ SOLN
INTRAMUSCULAR | Status: DC | PRN
  Administered 2015-10-28: 10 mg via INTRAVENOUS

## 2015-10-28 MED ORDER — CHLORHEXIDINE GLUCONATE CLOTH 2 % EX PADS
6.0000 | MEDICATED_PAD | Freq: Once | CUTANEOUS | Status: DC
Start: 2015-10-28 — End: 2015-10-28

## 2015-10-28 MED ORDER — FENTANYL CITRATE (PF) 100 MCG/2ML IJ SOLN
INTRAMUSCULAR | Status: AC
  Filled 2015-10-28: qty 4

## 2015-10-28 MED ORDER — ACETAMINOPHEN 650 MG RE SUPP: 650.0000 mg | Freq: Four times a day (QID) | RECTAL | Status: DC | PRN

## 2015-10-28 MED ORDER — ACETAMINOPHEN 325 MG PO TABS: 650.0000 mg | ORAL_TABLET | Freq: Four times a day (QID) | ORAL | Status: DC | PRN

## 2015-10-28 MED ORDER — PROPOFOL 10 MG/ML IV BOLUS
INTRAVENOUS | Status: DC | PRN
  Administered 2015-10-28: 130 mg via INTRAVENOUS

## 2015-10-28 MED ORDER — HYDROCODONE-ACETAMINOPHEN 5-325 MG PO TABS: 1.0000 | ORAL_TABLET | ORAL | 0 refills | Status: DC | PRN

## 2015-10-28 MED ORDER — ROCURONIUM BROMIDE 10 MG/ML (PF) SYRINGE
PREFILLED_SYRINGE | INTRAVENOUS | Status: AC
  Filled 2015-10-28: qty 10

## 2015-10-28 MED ORDER — SUGAMMADEX SODIUM 200 MG/2ML IV SOLN
INTRAVENOUS | Status: DC | PRN
  Administered 2015-10-28: 200 mg via INTRAVENOUS

## 2015-10-28 MED ORDER — SUGAMMADEX SODIUM 200 MG/2ML IV SOLN
INTRAVENOUS | Status: AC
  Filled 2015-10-28: qty 2

## 2015-10-28 MED ORDER — SUCCINYLCHOLINE CHLORIDE 200 MG/10ML IV SOSY
PREFILLED_SYRINGE | INTRAVENOUS | Status: DC | PRN
  Administered 2015-10-28: 100 mg via INTRAVENOUS

## 2015-10-28 MED ORDER — CEFAZOLIN SODIUM-DEXTROSE 2-4 GM/100ML-% IV SOLN
INTRAVENOUS | Status: AC
  Filled 2015-10-28: qty 100

## 2015-10-28 MED ORDER — LACTATED RINGERS IR SOLN
Status: DC | PRN
  Administered 2015-10-28: 1000 mL

## 2015-10-28 SURGICAL SUPPLY — 42 items
APL SKNCLS STERI-STRIP NONHPOA (GAUZE/BANDAGES/DRESSINGS) ×1
APPLIER CLIP ROT 10 11.4 M/L (STAPLE) ×3
APR CLP MED LRG 11.4X10 (STAPLE) ×1
BAG SPEC RTRVL LRG 6X4 10 (ENDOMECHANICALS) ×1
BENZOIN TINCTURE PRP APPL 2/3 (GAUZE/BANDAGES/DRESSINGS) ×3 IMPLANT
CABLE HIGH FREQUENCY MONO STRZ (ELECTRODE) ×3 IMPLANT
CHLORAPREP W/TINT 26ML (MISCELLANEOUS) ×6 IMPLANT
CLIP APPLIE ROT 10 11.4 M/L (STAPLE) ×1 IMPLANT
CLOSURE WOUND 1/2 X4 (GAUZE/BANDAGES/DRESSINGS) ×1
COVER MAYO STAND STRL (DRAPES) ×3 IMPLANT
COVER SURGICAL LIGHT HANDLE (MISCELLANEOUS) ×3 IMPLANT
DECANTER SPIKE VIAL GLASS SM (MISCELLANEOUS) ×3 IMPLANT
DRAPE C-ARM 42X120 X-RAY (DRAPES) ×3 IMPLANT
ELECT REM PT RETURN 9FT ADLT (ELECTROSURGICAL) ×3
ELECTRODE REM PT RTRN 9FT ADLT (ELECTROSURGICAL) ×1 IMPLANT
GAUZE SPONGE 2X2 8PLY STRL LF (GAUZE/BANDAGES/DRESSINGS) ×1 IMPLANT
GLOVE BIO SURGEON STRL SZ7.5 (GLOVE) ×8 IMPLANT
GLOVE BIOGEL PI IND STRL 7.5 (GLOVE) IMPLANT
GLOVE BIOGEL PI INDICATOR 7.5 (GLOVE) ×2
GLOVE ECLIPSE 7.5 STRL STRAW (GLOVE) ×2 IMPLANT
GLOVE SURG ORTHO 8.0 STRL STRW (GLOVE) ×3 IMPLANT
GOWN STRL REUS W/TWL LRG LVL3 (GOWN DISPOSABLE) ×2 IMPLANT
GOWN STRL REUS W/TWL XL LVL3 (GOWN DISPOSABLE) ×8 IMPLANT
HEMOSTAT SURGICEL 4X8 (HEMOSTASIS) IMPLANT
IRRIG SUCT STRYKERFLOW 2 WTIP (MISCELLANEOUS) ×3
IRRIGATION SUCT STRKRFLW 2 WTP (MISCELLANEOUS) ×1 IMPLANT
KIT BASIN OR (CUSTOM PROCEDURE TRAY) ×3 IMPLANT
LIQUID BAND (GAUZE/BANDAGES/DRESSINGS) ×2 IMPLANT
POUCH SPECIMEN RETRIEVAL 10MM (ENDOMECHANICALS) ×3 IMPLANT
SCISSORS LAP 5X35 DISP (ENDOMECHANICALS) ×3 IMPLANT
SET CHOLANGIOGRAPH MIX (MISCELLANEOUS) ×3 IMPLANT
SLEEVE XCEL OPT CAN 5 100 (ENDOMECHANICALS) ×3 IMPLANT
SPONGE GAUZE 2X2 STER 10/PKG (GAUZE/BANDAGES/DRESSINGS) ×2
STRIP CLOSURE SKIN 1/2X4 (GAUZE/BANDAGES/DRESSINGS) ×2 IMPLANT
SUT MNCRL AB 4-0 PS2 18 (SUTURE) ×3 IMPLANT
TOWEL OR 17X26 10 PK STRL BLUE (TOWEL DISPOSABLE) ×3 IMPLANT
TOWEL OR NON WOVEN STRL DISP B (DISPOSABLE) ×3 IMPLANT
TRAY LAPAROSCOPIC (CUSTOM PROCEDURE TRAY) ×3 IMPLANT
TROCAR BLADELESS OPT 5 100 (ENDOMECHANICALS) ×3 IMPLANT
TROCAR XCEL BLUNT TIP 100MML (ENDOMECHANICALS) ×3 IMPLANT
TROCAR XCEL NON-BLD 11X100MML (ENDOMECHANICALS) ×3 IMPLANT
TUBING INSUF HEATED (TUBING) ×2 IMPLANT

## 2015-10-28 NOTE — Op Note (Signed)
Procedure Note  Pre-operative Diagnosis:  Chronic cholecystitis, cholelithiasis  Post-operative Diagnosis:  same  Surgeon:  Earnstine Regal, MD, FACS  Assistant:  Alphonsa Overall, MD, FACS   Procedure:  Laparoscopic cholecystectomy with intra-operative cholangiography  Anesthesia:  General  Estimated Blood Loss:  minimal  Drains: non e         Specimen: Gallbladder to pathology  Indications:  The patient is a 70 year old female who presents for evaluation of gall stones.  Patient is referred by Dr. Zenovia Jarred for surgical management of chronic cholecystitis and cholelithiasis. Patient's primary care physician is Dr. Tamsen Roers. Patient has had long-standing symptoms of gastroesophageal reflux and epigastric abdominal pain. Patient underwent an ultrasound in July 2017 which showed layering sludge and gallstones. There was no sign of acute inflammation. Patient has had no prior history of hepatobiliary or pancreatic disease. She has undergone laparoscopic surgery by her gynecologist and has had open total abdominal hysterectomy and appendectomy. Patient notes intermittent epigastric abdominal pain occasionally radiating to the back. This is associated with nausea. She denies emesis. She notes symptoms are caused frequently by fatty or greasy food. There is no family history of biliary disease. Patient denies jaundice or acholic stools. She presents today to discuss cholecystectomy.  Procedure Details:  The patient was seen in the pre-op holding area. The risks, benefits, complications, treatment options, and expected outcomes were previously discussed with the patient. The patient agreed with the proposed plan and has signed the informed consent form.  The patient was brought to the Operating Room, identified as Brooke [name] and the procedure verified as laparoscopic cholecystectomy with intraoperative cholangiography. A "time out" was completed and the above information  confirmed.  The patient was placed in the supine position. Following induction of general anesthesia, the abdomen was prepped and draped in the usual aseptic fashion.  An incision was made in the skin near the umbilicus. The midline fascia was incised and the peritoneal cavity was entered and a Hasson canula was introduced under direct vision.  The Hasson canula was secured with a 0-Vicryl pursestring suture. Pneumoperitoneum was established with carbon dioxide. Additional trocars were introduced under direct vision along the right costal margin in the midline, mid-clavicular line, and anterior axillary line.   The gallbladder was identified and the fundus grasped and retracted cephalad. Adhesions were taken down bluntly and the electrocautery was utilized as needed, taking care not to injure any adjacent structures. The infundibulum was grasped and retracted laterally, exposing the peritoneum overlying the triangle of Calot. The peritoneum was incised and structures exposed with blunt dissection. The cystic duct was clearly identified, bluntly dissected circumferentially, and clipped at the neck of the gallbladder.  An incision was made in the cystic duct and the cholangiogram catheter introduced. The catheter was secured using an ligaclip.  Real-time cholangiography was performed using C-arm fluoroscopy.  There was rapid filling of a normal caliber common bile duct.  There was reflux of contrast into the left and right hepatic ductal systems.  There was free flow distally into the duodenum without filling defect or obstruction.  The catheter was removed from the peritoneal cavity.  The cystic duct was then ligated with surgical clips and divided. The cystic artery was identified, dissected circumferentially, ligated with ligaclips, and divided.  The gallbladder was dissected away from the liver bed using the electrocautery for hemostasis. The gallbladder was completely removed from the liver and placed  into an endocatch bag. The gallbladder was removed in the  endocatch bag through the umbilical port site and submitted to pathology for review.  The right upper quadrant was irrigated and the gallbladder bed was inspected. Hemostasis was achieved with the electrocautery.  Pneumoperitoneum was released after viewing removal of the trocars with good hemostasis noted. The umbilical wound was irrigated and the fascia was then closed with the pursestring suture.  Local anesthetic was infiltrated at all port sites. The skin incisions were closed with 4-0 Monocril subcuticular sutures and steri-strips and dressings were applied.  Instrument, sponge, and needle counts were correct at the conclusion of the case.  The patient was awakened from anesthesia and brought to the recovery room in stable condition.  The patient tolerated the procedure well.   Earnstine Regal, MD, [facility] Office: [phone]

## 2015-10-28 NOTE — Transfer of Care (Signed)
Immediate Anesthesia Transfer of Care Note  Patient: Brooke [name]  Procedure(s) Performed: Procedure(s): LAPAROSCOPIC CHOLECYSTECTOMY WITH INTRAOPERATIVE CHOLANGIOGRAM (N/A)  Patient Location: PACU  Anesthesia Type:General  Level of Consciousness:  sedated, patient cooperative and responds to stimulation  Airway & Oxygen Therapy:Patient Spontanous Breathing and Patient connected to face mask oxgen  Post-op Assessment:  Report given to PACU RN and Post -op Vital signs reviewed and stable  Post vital signs:  Reviewed and stable  Last Vitals:  Vitals:   10/28/15 0527  BP: (!) 152/88  Pulse: 80  Resp: 16  Temp: 123XX123 C    Complications: No apparent anesthesia complications

## 2015-10-28 NOTE — Anesthesia Preprocedure Evaluation (Addendum)
Anesthesia Evaluation  Patient identified by MRN, date of birth, ID band Patient awake    Reviewed: Allergy & Precautions, NPO status , Patient's Chart, lab work & pertinent test results  History of Anesthesia Complications (+) history of anesthetic complications (hypotension with one surgery)  Airway Mallampati: II  TM Distance: >3 FB Neck ROM: Full    Dental  (+) Teeth Intact, Dental Advisory Given, Caps,    Pulmonary neg pulmonary ROS,    Pulmonary exam normal breath sounds clear to auscultation       Cardiovascular Exercise Tolerance: Good negative cardio ROS Normal cardiovascular exam Rhythm:Regular Rate:Normal     Neuro/Psych  Headaches, negative psych ROS   GI/Hepatic Neg liver ROS, hiatal hernia, GERD  Medicated,  Endo/Other  Hypothyroidism   Renal/GU negative Renal ROS     Musculoskeletal  (+) Arthritis ,   Abdominal   Peds  Hematology negative hematology ROS (+)   Anesthesia Other Findings Day of surgery medications reviewed with the patient.  Reproductive/Obstetrics negative OB ROS                            Anesthesia Physical Anesthesia Plan  ASA: II  Anesthesia Plan: General   Post-op Pain Management:    Induction: Intravenous  Airway Management Planned: Oral ETT  Additional Equipment:   Intra-op Plan:   Post-operative Plan: Extubation in OR  Informed Consent: I have reviewed the patients History and Physical, chart, labs and discussed the procedure including the risks, benefits and alternatives for the proposed anesthesia with the patient or authorized representative who has indicated his/her understanding and acceptance.   Dental advisory given  Plan Discussed with: CRNA  Anesthesia Plan Comments: (Risks/benefits of general anesthesia discussed with patient including risk of damage to teeth, lips, gum, and tongue, nausea/vomiting, allergic reactions to  medications, and the possibility of heart attack, stroke and death.  All patient questions answered.  Patient wishes to proceed.)        Anesthesia Quick Evaluation

## 2015-10-28 NOTE — Interval H&P Note (Signed)
History and Physical Interval Note:  10/28/2015 7:18 AM  Brooke [name]  has presented today for surgery, with the diagnosis of CHRONIC CHOLECYSTITIS,Cholelithiasis.  The various methods of treatment have been discussed with the patient and family. After consideration of risks, benefits and other options for treatment, the patient has consented to    Procedure(s): LAPAROSCOPIC CHOLECYSTECTOMY WITH INTRAOPERATIVE CHOLANGIOGRAM (N/A) as a surgical intervention .    The patient's history has been reviewed, patient examined, no change in status, stable for surgery.  I have reviewed the patient's chart and labs.  Questions were answered to the patient's satisfaction.    Earnstine Regal, MD, [facility] Office: [place]

## 2015-10-28 NOTE — Anesthesia Procedure Notes (Signed)
Procedure Name: Intubation Date/Time: 10/28/2015 7:29 AM Performed by: Lajuana Carry E Pre-anesthesia Checklist: Patient identified, Emergency Drugs available, Suction available and Patient being monitored Patient Re-evaluated:Patient Re-evaluated prior to inductionOxygen Delivery Method: Circle system utilized Preoxygenation: Pre-oxygenation with 100% oxygen Intubation Type: IV induction Ventilation: Mask ventilation without difficulty Laryngoscope Size: Glidescope and 3 Grade View: Grade I Tube size: 7.0 mm Number of attempts: 2 Airway Equipment and Method: Video-laryngoscopy Placement Confirmation: ETT inserted through vocal cords under direct vision,  positive ETCO2 and breath sounds checked- equal and bilateral Secured at: 21 cm Tube secured with: Tape Dental Injury: Teeth and Oropharynx as per pre-operative assessment  Difficulty Due To: Difficult Airway- due to anterior larynx Comments: DL w/ Sabra Heck 2, arytnoids viewed, decision to use glidescope. Easy mask a/w, atraumatic intubation with Glidescope, anterior airway with limited mouth opening.

## 2015-10-28 NOTE — Anesthesia Postprocedure Evaluation (Signed)
Anesthesia Post Note  Patient: Brooke [name]  Procedure(s) Performed: Procedure(s) (LRB): LAPAROSCOPIC CHOLECYSTECTOMY WITH INTRAOPERATIVE CHOLANGIOGRAM (N/A)  Patient location during evaluation: PACU Anesthesia Type: General Level of consciousness: awake and alert Pain management: pain level controlled Vital Signs Assessment: post-procedure vital signs reviewed and stable Respiratory status: spontaneous breathing, nonlabored ventilation, respiratory function stable and patient connected to nasal cannula oxygen Cardiovascular status: blood pressure returned to baseline and stable Postop Assessment: no signs of nausea or vomiting Anesthetic complications: no    Last Vitals:  Vitals:   10/28/15 1219 10/28/15 1332  BP: 138/81 119/84  Pulse: 88 84  Resp: 17 18  Temp: 36.6 C 36.7 C    Last Pain:  Vitals:   10/28/15 1410  TempSrc:   PainSc: 2                  Catalina Gravel

## 2015-10-29 DIAGNOSIS — K8010 Calculus of gallbladder with chronic cholecystitis without obstruction: Secondary | ICD-10-CM | POA: Diagnosis not present

## 2015-10-29 MED ORDER — PROMETHAZINE HCL 25 MG PO TABS: 25.0000 mg | ORAL_TABLET | Freq: Four times a day (QID) | ORAL | 0 refills | Status: DC | PRN

## 2015-10-29 NOTE — Discharge Summary (Signed)
Physician Discharge Summary [facility]  Patient ID: ALYRICA SEMON MRN: [id number] DOB/AGE: 02-23-1945 70 y.o.  Admit date: 10/28/2015 Discharge date: 10/29/2015  Admission Diagnoses:  Chronic cholecystitis, cholelithiasis  Discharge Diagnoses:  Principal Problem:   Cholelithiasis with chronic cholecystitis   Discharged Condition: good  Hospital Course: Patient was admitted for observation following gallbladder surgery.  Post op course was uncomplicated.  Pain was well controlled.  Tolerated diet.  Patient was prepared for discharge home on POD#1.  Consults: None  Treatments: surgery: lap chole with iOC  Discharge Exam: Blood pressure 107/69, pulse 79, temperature 98.1 F (36.7 C), temperature source Oral, resp. rate 16, height 5' 1.5" (1.562 m), weight 62.2 kg (137 lb 3 oz), SpO2 97 %. HEENT - clear Neck - soft Chest - clear bilaterally Cor - RRR Abd - soft without distension; dressings intact  Disposition: Home  Discharge Instructions    Diet - low sodium heart healthy    Complete by:  As directed    Discharge instructions    Complete by:  As directed    [facility]  LAPAROSCOPIC SURGERY:  POST-OP INSTRUCTIONS  Always review your discharge instruction sheet given to you by the facility where your surgery was performed.  A prescription for pain medication may be given to you upon discharge.  Take your pain medication as prescribed.  If narcotic pain medicine is not needed, then you may take acetaminophen (Tylenol) or ibuprofen (Advil) as needed.  Take your usually prescribed medications unless otherwise directed.  If you need a refill on your pain medication, please contact your pharmacy.  They will contact our office to request authorization. Prescriptions will not be filled after 5 P.M. or on weekends.  You should follow a light diet the first few days after arrival home, such as soup and crackers or toast.  Be sure to  include plenty of fluids daily.  Most patients will experience some swelling and bruising in the area of the incisions.  Ice packs will help.  Swelling and bruising can take several days to resolve.   It is common to experience some constipation after surgery.  Increasing fluid intake and taking a stool softener (such as Colace) will usually help or prevent this problem from occurring.  A mild laxative (Milk of Magnesia or Miralax) should be taken according to package instructions if there has been no bowel movement after 48 hours.  You will have steri-strips and a gauze dressing over your incisions.  You may remove the gauze bandage on the second day after surgery, and you may shower at that time.  Leave your steri-strips (small skin tapes) in place directly over the incision.  These strips should remain on the skin for 5-7 days and then be removed.  You may get them wet in the shower and pat them dry.  Any sutures or staples will be removed at the office during your follow-up visit.  ACTIVITIES:  You may resume regular (light) daily activities beginning the next day - such as daily self-care, walking, climbing stairs - gradually increasing activities as tolerated.  You may have sexual intercourse when it is comfortable.  Refrain from any heavy lifting or straining until approved by your doctor.  You may drive when you are no longer taking prescription pain medication, you can comfortably wear a seatbelt, and you can safely maneuver your car and apply brakes.  You should see your doctor in the office for a follow-up appointment approximately 2-3  weeks after your surgery.  Make sure that you call for this appointment within a day or two after you arrive home to insure a convenient appointment time.  WHEN TO CALL YOUR DOCTOR: Fever over 101.0 Inability to urinate Continued bleeding from incision Increased pain, redness, or drainage from the incision Increasing abdominal pain  The clinic staff  is available to answer your questions during regular business hours.  Please don't hesitate to call and ask to speak to one of the nurses for clinical concerns.  If you have a medical emergency, go to the nearest emergency room or call 911.  A surgeon from [facility] is always on call for the hospital.  Earnstine Regal, MD, [facility] Office: [place] Free:  [place] [phone]  Website: [email or web address]   Increase activity slowly    Complete by:  As directed    No dressing needed    Complete by:  As directed        Medication List    TAKE these medications   acetaminophen 325 MG tablet Commonly known as:  TYLENOL Take 650 mg by mouth every 6 (six) hours as needed for headache.   ALPRAZolam 0.5 MG tablet Commonly known as:  XANAX Take 0.5 mg by mouth at bedtime as needed for anxiety.   cetirizine 10 MG tablet Commonly known as:  ZYRTEC Take 10 mg by mouth every evening.   fluticasone 50 MCG/ACT nasal spray Commonly known as:  FLONASE Place 1 spray into both nostrils every evening.   HYDROcodone-acetaminophen 5-325 MG tablet Commonly known as:  NORCO/VICODIN Take 1 tablet by mouth every 4 (four) hours as needed for moderate pain. What changed:  Another medication with the same name was added. Make sure you understand how and when to take each.   HYDROcodone-acetaminophen 5-325 MG tablet Commonly known as:  NORCO/VICODIN Take 1-2 tablets by mouth every 4 (four) hours as needed for moderate pain. What changed:  You were already taking a medication with the same name, and this prescription was added. Make sure you understand how and when to take each.   oxymetazoline 0.05 % nasal spray Commonly known as:  AFRIN Place 1 spray into both nostrils as needed for congestion.   pantoprazole 40 MG tablet Commonly known as:  PROTONIX Take 1 tablet (40 mg total) by mouth every morning. Before  breakfast What changed:  when to take this  additional instructions   promethazine 25 MG tablet Commonly known as:  PHENERGAN Take 1 tablet (25 mg total) by mouth every 6 (six) hours as needed for nausea or vomiting.   ranitidine 150 MG tablet Commonly known as:  ZANTAC Take 1 tablet (150 mg total) by mouth every evening.   rosuvastatin 5 MG tablet Commonly known as:  CRESTOR Take 5 mg by mouth daily.   Vitamin D3 5000 units Caps Take 1 capsule by mouth daily.        Earnstine Regal, MD, [facility] Office: [phone]   Signed: Earnstine Regal 10/29/2015, 7:48 AM

## 2015-10-29 NOTE — Progress Notes (Signed)
Discharge instructions discussed with patient and husband until no further questions ask. Am assessment unchanged except iv discontinued.

## 2016-07-28 ENCOUNTER — Other Ambulatory Visit: Payer: Self-pay | Admitting: Family Medicine

## 2016-07-28 DIAGNOSIS — Z1231 Encounter for screening mammogram for malignant neoplasm of breast: Secondary | ICD-10-CM

## 2016-09-22 ENCOUNTER — Ambulatory Visit
Admission: RE | Admit: 2016-09-22 | Discharge: 2016-09-22 | Disposition: A | Discharge status: Home / Self Care | Payer: Medicare HMO | Source: Ambulatory Visit | Attending: Family Medicine | Admitting: Family Medicine

## 2016-09-22 DIAGNOSIS — Z1231 Encounter for screening mammogram for malignant neoplasm of breast: Secondary | ICD-10-CM

## 2017-05-09 ENCOUNTER — Encounter: Payer: Self-pay | Admitting: Internal Medicine

## 2017-06-08 ENCOUNTER — Ambulatory Visit
Admission: RE | Admit: 2017-06-08 | Discharge: 2017-06-08 | Disposition: A | Discharge status: Home / Self Care | Payer: Medicare HMO | Source: Ambulatory Visit | Attending: Family Medicine | Admitting: Family Medicine

## 2017-06-08 ENCOUNTER — Other Ambulatory Visit: Payer: Self-pay | Admitting: Family Medicine

## 2017-06-08 DIAGNOSIS — R0600 Dyspnea, unspecified: Secondary | ICD-10-CM

## 2017-06-08 DIAGNOSIS — Z801 Family history of malignant neoplasm of trachea, bronchus and lung: Secondary | ICD-10-CM

## 2017-09-21 ENCOUNTER — Other Ambulatory Visit: Payer: Self-pay | Admitting: Family Medicine

## 2017-09-21 ENCOUNTER — Encounter: Payer: Self-pay | Admitting: Internal Medicine

## 2017-09-21 DIAGNOSIS — Z1231 Encounter for screening mammogram for malignant neoplasm of breast: Secondary | ICD-10-CM

## 2017-10-17 ENCOUNTER — Ambulatory Visit
Admission: RE | Admit: 2017-10-17 | Discharge: 2017-10-17 | Disposition: A | Discharge status: Home / Self Care | Payer: Medicare HMO | Source: Ambulatory Visit | Attending: Family Medicine | Admitting: Family Medicine

## 2017-10-17 DIAGNOSIS — Z1231 Encounter for screening mammogram for malignant neoplasm of breast: Secondary | ICD-10-CM

## 2017-10-31 ENCOUNTER — Ambulatory Visit (AMBULATORY_SURGERY_CENTER): Payer: Self-pay | Admitting: *Deleted

## 2017-10-31 ENCOUNTER — Encounter: Payer: Self-pay | Admitting: Internal Medicine

## 2017-10-31 VITALS — Ht 62.0 in | Wt 136.0 lb

## 2017-10-31 DIAGNOSIS — Z800 Family history of malignant neoplasm of digestive organs: Secondary | ICD-10-CM

## 2017-10-31 MED ORDER — NA SULFATE-K SULFATE-MG SULF 17.5-3.13-1.6 GM/177ML PO SOLN
ORAL | 0 refills | Status: DC
Start: 2017-10-31 — End: 2017-11-14

## 2017-10-31 NOTE — Progress Notes (Signed)
Patient denies any allergies to eggs or soy. Patient denies any problems with anesthesia/sedation. Patient denies any oxygen use at home. Patient denies taking any diet/weight loss medications or blood thinners. EMMI education offered, pt declined.  

## 2017-11-14 ENCOUNTER — Ambulatory Visit (AMBULATORY_SURGERY_CENTER): Payer: Medicare HMO | Admitting: Internal Medicine

## 2017-11-14 ENCOUNTER — Encounter: Payer: Self-pay | Admitting: Internal Medicine

## 2017-11-14 VITALS — BP 128/77 | HR 74 | Temp 99.3°F | Resp 16 | Ht 63.0 in | Wt 140.0 lb

## 2017-11-14 DIAGNOSIS — Z86010 Personal history of colonic polyps: Secondary | ICD-10-CM | POA: Insufficient documentation

## 2017-11-14 DIAGNOSIS — K635 Polyp of colon: Secondary | ICD-10-CM

## 2017-11-14 DIAGNOSIS — D124 Benign neoplasm of descending colon: Secondary | ICD-10-CM | POA: Diagnosis not present

## 2017-11-14 DIAGNOSIS — D123 Benign neoplasm of transverse colon: Secondary | ICD-10-CM

## 2017-11-14 DIAGNOSIS — D125 Benign neoplasm of sigmoid colon: Secondary | ICD-10-CM

## 2017-11-14 HISTORY — PX: COLONOSCOPY W/ POLYPECTOMY: SHX1380

## 2017-11-14 MED ORDER — SODIUM CHLORIDE 0.9 % IV SOLN: 500.0000 mL | Freq: Once | INTRAVENOUS | Status: DC

## 2017-11-14 NOTE — Progress Notes (Signed)
A/ox3 pleased with MAC, report to RN 

## 2017-11-14 NOTE — Progress Notes (Signed)
Pt's states no medical or surgical changes since previsit or office visit. 

## 2017-11-14 NOTE — Op Note (Signed)
[place] Patient Name: Brooke [name] Procedure Date: 11/14/2017 12:06 PM MRN: [id number] Endoscopist: Gatha Mayer , MD Age: 72 Referring MD:  Date of Birth: [birth year] Gender: Female Account #: [id number] Procedure:                Colonoscopy Indications:              Screening patient at increased risk: Family history                            of 1st-degree relative with colorectal cancer at                            age 33 years (or older), Colon cancer screening in                            patient at increased risk: Family history of                            colorectal cancer in multiple 2nd degree relatives Medicines:                Propofol per Anesthesia, Monitored Anesthesia Care Procedure:                Pre-Anesthesia Assessment:                           - Prior to the procedure, a History and Physical                            was performed, and patient medications and                            allergies were reviewed. The patient's tolerance of                            previous anesthesia was also reviewed. The risks                            and benefits of the procedure and the sedation                            options and risks were discussed with the patient.                            All questions were answered, and informed consent                            was obtained. Prior Anticoagulants: The patient has                            taken no previous anticoagulant or antiplatelet                            agents. ASA Grade Assessment: II - A patient with  mild systemic disease. After reviewing the risks                            and benefits, the patient was deemed in                            satisfactory condition to undergo the procedure.                           After obtaining informed consent, the colonoscope                            was passed under direct vision. Throughout the                procedure, the patient's blood pressure, pulse, and                            oxygen saturations were monitored continuously. The                            Colonoscope was introduced through the anus and                            advanced to the the cecum, identified by                            appendiceal orifice and ileocecal valve. The                            colonoscopy was performed without difficulty. The                            patient tolerated the procedure well. The quality                            of the bowel preparation was excellent. The bowel                            preparation used was SUPREP. The ileocecal valve,                            appendiceal orifice, and rectum were photographed. Scope In: 12:10:28 PM Scope Out: 12:23:49 PM Scope Withdrawal Time: 0 hours 11 minutes 31 seconds  Total Procedure Duration: 0 hours 13 minutes 21 seconds  Findings:                 The perianal and digital rectal examinations were                            normal.                           Two sessile polyps were found in the sigmoid colon  and transverse colon. The polyps were 1 to 2 mm in                            size. These polyps were removed with a cold biopsy                            forceps. Resection and retrieval were complete.                            Verification of patient identification for the                            specimen was done. Estimated blood loss was minimal.                           A 4 mm polyp was found in the descending colon. The                            polyp was sessile. The polyp was removed with a                            cold snare. Resection and retrieval were complete.                            Verification of patient identification for the                            specimen was done. Estimated blood loss was minimal.                           The exam was otherwise without  abnormality on                            direct and retroflexion views. Complications:            No immediate complications. Estimated Blood Loss:     Estimated blood loss was minimal. Impression:               - Two 1 to 2 mm polyps in the sigmoid colon and in                            the transverse colon, removed with a cold biopsy                            forceps. Resected and retrieved.                           - One 4 mm polyp in the descending colon, removed                            with a cold snare. Resected and retrieved.                           -  The examination was otherwise normal on direct                            and retroflexion views.                           - FHx CRCa father (elderly), cousin and paternal                            grandfather Recommendation:           - Patient has a contact number available for                            emergencies. The signs and symptoms of potential                            delayed complications were discussed with the                            patient. Return to normal activities tomorrow.                            Written discharge instructions were provided to the                            patient.                           - Resume previous diet.                           - Continue present medications.                           - Repeat colonoscopy is recommended for                            surveillance. The colonoscopy date will be                            determined after pathology results from today's                            exam become available for review. Gatha Mayer, MD 11/14/2017 12:33:01 PM This report has been signed electronically.

## 2017-11-14 NOTE — Patient Instructions (Addendum)
   I found and removed 3 tiny polyps today. I will let you know pathology results and when to have another routine colonoscopy by mail and/or My Chart.  I appreciate the opportunity to care for you. Gatha Mayer, MD, FACG    YOU HAD AN ENDOSCOPIC PROCEDURE TODAY AT [facility]:   Refer to the procedure report that was given to you for any specific questions about what was found during the examination.  If the procedure report does not answer your questions, please call your gastroenterologist to clarify.  If you requested that your care partner not be given the details of your procedure findings, then the procedure report has been included in a sealed envelope for you to review at your convenience later.  YOU SHOULD EXPECT: Some feelings of bloating in the abdomen. Passage of more gas than usual.  Walking can help get rid of the air that was put into your GI tract during the procedure and reduce the bloating. If you had a lower endoscopy (such as a colonoscopy or flexible sigmoidoscopy) you may notice spotting of blood in your stool or on the toilet paper. If you underwent a bowel prep for your procedure, you may not have a normal bowel movement for a few days.  Please Note:  You might notice some irritation and congestion in your nose or some drainage.  This is from the oxygen used during your procedure.  There is no need for concern and it should clear up in a day or so.  SYMPTOMS TO REPORT IMMEDIATELY:   Following lower endoscopy (colonoscopy or flexible sigmoidoscopy):  Excessive amounts of blood in the stool  Significant tenderness or worsening of abdominal pains  Swelling of the abdomen that is new, acute  Fever of 100F or higher   For urgent or emergent issues, a gastroenterologist can be reached at any hour by calling [phone].   DIET:  We do recommend a small meal at first, but then you may proceed to your regular diet.  Drink plenty of fluids but  you should avoid alcoholic beverages for 24 hours.  ACTIVITY:  You should plan to take it easy for the rest of today and you should NOT DRIVE or use heavy machinery until tomorrow (because of the sedation medicines used during the test).    FOLLOW UP: Our staff will call the number listed on your records the next business day following your procedure to check on you and address any questions or concerns that you may have regarding the information given to you following your procedure. If we do not reach you, we will leave a message.  However, if you are feeling well and you are not experiencing any problems, there is no need to return our call.  We will assume that you have returned to your regular daily activities without incident.  If any biopsies were taken you will be contacted by phone or by letter within the next 1-3 weeks.  Please call us at [phone] if you have not heard about the biopsies in 3 weeks.    SIGNATURES/CONFIDENTIALITY: You and/or your care partner have signed paperwork which will be entered into your electronic medical record.  These signatures attest to the fact that that the information above on your After Visit Summary has been reviewed and is understood.  Full responsibility of the confidentiality of this discharge information lies with you and/or your care-partner.   Resume medications. Information given on polyps.

## 2017-11-14 NOTE — Progress Notes (Signed)
Called to room to assist during endoscopic procedure.  Patient ID and intended procedure confirmed with present staff. Received instructions for my participation in the procedure from the performing physician.  

## 2017-11-15 ENCOUNTER — Telehealth: Payer: Self-pay | Admitting: *Deleted

## 2017-11-15 ENCOUNTER — Telehealth: Payer: Self-pay

## 2017-11-15 NOTE — Telephone Encounter (Signed)
  Follow up Call-  Call back number 11/14/2017  Post procedure Call Back phone  # [phone]  Permission to leave phone message Yes  Some recent data might be hidden     Patient questions:  Do you have a fever, pain , or abdominal swelling? No. Pain Score  0 *  Have you tolerated food without any problems? Yes.    Have you been able to return to your normal activities? Yes.    Do you have any questions about your discharge instructions: Diet   No. Medications  No. Follow up visit  No.  Do you have questions or concerns about your Care? No.  Actions: * If pain score is 4 or above: No action needed, pain <4.

## 2017-11-15 NOTE — Telephone Encounter (Signed)
Left message

## 2017-11-30 ENCOUNTER — Encounter: Payer: Self-pay | Admitting: Internal Medicine

## 2017-11-30 DIAGNOSIS — Z86010 Personal history of colonic polyps: Secondary | ICD-10-CM

## 2017-11-30 DIAGNOSIS — Z860101 Personal history of adenomatous and serrated colon polyps: Secondary | ICD-10-CM

## 2017-11-30 HISTORY — DX: Personal history of colonic polyps: Z86.010

## 2017-11-30 HISTORY — DX: Personal history of adenomatous and serrated colon polyps: Z86.0101

## 2017-11-30 NOTE — Progress Notes (Signed)
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

## 2018-08-29 ENCOUNTER — Other Ambulatory Visit: Payer: Self-pay

## 2018-08-29 ENCOUNTER — Other Ambulatory Visit: Payer: Self-pay | Admitting: Family Medicine

## 2018-08-29 ENCOUNTER — Ambulatory Visit
Admission: RE | Admit: 2018-08-29 | Discharge: 2018-08-29 | Disposition: A | Discharge status: Home / Self Care | Payer: Medicare HMO | Source: Ambulatory Visit | Attending: Family Medicine | Admitting: Family Medicine

## 2018-08-29 DIAGNOSIS — M545 Low back pain, unspecified: Secondary | ICD-10-CM

## 2018-08-29 DIAGNOSIS — M79605 Pain in left leg: Secondary | ICD-10-CM

## 2018-09-12 ENCOUNTER — Other Ambulatory Visit: Payer: Self-pay | Admitting: Family Medicine

## 2018-09-12 DIAGNOSIS — Z1231 Encounter for screening mammogram for malignant neoplasm of breast: Secondary | ICD-10-CM

## 2018-10-19 ENCOUNTER — Other Ambulatory Visit: Payer: Self-pay | Admitting: Family Medicine

## 2018-10-19 DIAGNOSIS — N6320 Unspecified lump in the left breast, unspecified quadrant: Secondary | ICD-10-CM

## 2018-10-20 ENCOUNTER — Other Ambulatory Visit: Payer: Self-pay

## 2018-10-20 ENCOUNTER — Ambulatory Visit
Admission: RE | Admit: 2018-10-20 | Discharge: 2018-10-20 | Disposition: A | Discharge status: Home / Self Care | Payer: Medicare HMO | Source: Ambulatory Visit | Attending: Family Medicine | Admitting: Family Medicine

## 2018-10-20 ENCOUNTER — Other Ambulatory Visit: Payer: Self-pay | Admitting: Family Medicine

## 2018-10-20 DIAGNOSIS — N6320 Unspecified lump in the left breast, unspecified quadrant: Secondary | ICD-10-CM

## 2018-10-20 DIAGNOSIS — R599 Enlarged lymph nodes, unspecified: Secondary | ICD-10-CM

## 2018-10-25 ENCOUNTER — Ambulatory Visit: Payer: Medicare HMO

## 2019-03-10 ENCOUNTER — Ambulatory Visit: Payer: Medicare HMO

## 2019-03-17 ENCOUNTER — Ambulatory Visit: Payer: Medicare HMO | Attending: Internal Medicine

## 2019-03-17 DIAGNOSIS — Z23 Encounter for immunization: Secondary | ICD-10-CM | POA: Insufficient documentation

## 2019-03-17 NOTE — Progress Notes (Signed)
   Covid-19 Vaccination Clinic  Name:  Maniah Kratochvil    MRN: [id number] DOB: [birth year]  03/17/2019  Ms. Attalla was observed post Covid-19 immunization for 15 minutes without incidence. She was provided with Vaccine Information Sheet and instruction to access the V-Safe system.   Ms. Soulliere was instructed to call 911 with any severe reactions post vaccine: [name] Kitchen Difficulty breathing  . Swelling of your face and throat  . A fast heartbeat  . A bad rash all over your body  . Dizziness and weakness    Immunizations Administered    Name Date Dose VIS Date Route   Pfizer COVID-19 Vaccine 03/17/2019  5:54 PM 0.3 mL 01/19/2019 Intramuscular   Manufacturer: [place]   Lot: [id number]   [place]: [id number]

## 2019-03-21 ENCOUNTER — Ambulatory Visit: Payer: Medicare HMO

## 2019-04-11 ENCOUNTER — Ambulatory Visit: Payer: Medicare HMO | Attending: Internal Medicine

## 2019-04-11 DIAGNOSIS — Z23 Encounter for immunization: Secondary | ICD-10-CM | POA: Insufficient documentation

## 2019-04-11 NOTE — Progress Notes (Signed)
   Covid-19 Vaccination Clinic  Name:  Brooke [name]    MRN: [id number] DOB: 18-Feb-1945  04/11/2019  [name] was observed post Covid-19 immunization for 15 minutes without incident. She was provided with Vaccine Information Sheet and instruction to access the V-Safe system.   [name] was instructed to call 911 with any severe reactions post vaccine: [name] Kitchen Difficulty breathing  . Swelling of face and throat  . A fast heartbeat  . A bad rash all over body  . Dizziness and weakness   Immunizations Administered    Name Date Dose VIS Date Route   Pfizer COVID-19 Vaccine 04/11/2019  3:00 PM 0.3 mL 01/19/2019 Intramuscular   Manufacturer: [place]   Lot: [id number]   [place]: [id number]

## 2019-04-20 ENCOUNTER — Ambulatory Visit
Admission: RE | Admit: 2019-04-20 | Discharge: 2019-04-20 | Disposition: A | Discharge status: Home / Self Care | Payer: Medicare HMO | Source: Ambulatory Visit | Attending: Family Medicine | Admitting: Family Medicine

## 2019-04-20 ENCOUNTER — Other Ambulatory Visit: Payer: Self-pay

## 2019-04-20 ENCOUNTER — Other Ambulatory Visit: Payer: Self-pay | Admitting: Family Medicine

## 2019-04-20 DIAGNOSIS — R599 Enlarged lymph nodes, unspecified: Secondary | ICD-10-CM

## 2019-07-24 ENCOUNTER — Ambulatory Visit
Admission: RE | Admit: 2019-07-24 | Discharge: 2019-07-24 | Disposition: A | Discharge status: Home / Self Care | Payer: Medicare HMO | Source: Ambulatory Visit | Attending: Family Medicine | Admitting: Family Medicine

## 2019-07-24 ENCOUNTER — Other Ambulatory Visit: Payer: Self-pay | Admitting: Family Medicine

## 2019-07-24 ENCOUNTER — Other Ambulatory Visit: Payer: Self-pay

## 2019-07-24 DIAGNOSIS — R599 Enlarged lymph nodes, unspecified: Secondary | ICD-10-CM

## 2019-07-30 ENCOUNTER — Other Ambulatory Visit: Payer: Medicare HMO

## 2019-08-01 ENCOUNTER — Other Ambulatory Visit: Payer: Medicare HMO

## 2019-09-05 ENCOUNTER — Ambulatory Visit
Admission: RE | Admit: 2019-09-05 | Discharge: 2019-09-05 | Disposition: A | Discharge status: Home / Self Care | Payer: Medicare HMO | Source: Ambulatory Visit | Attending: Family Medicine | Admitting: Family Medicine

## 2019-09-05 ENCOUNTER — Other Ambulatory Visit: Payer: Self-pay

## 2019-09-05 DIAGNOSIS — R599 Enlarged lymph nodes, unspecified: Secondary | ICD-10-CM

## 2019-09-12 ENCOUNTER — Telehealth: Payer: Self-pay | Admitting: Hematology

## 2019-09-12 NOTE — Telephone Encounter (Signed)
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 Brooke [name] has been scheduled to see Dr. Irene Limbo on 8/12 at 240pm. I called and left the appt date and time on the pt's voicemail. Letter mailed.

## 2019-09-19 NOTE — Progress Notes (Signed)
HEMATOLOGY/ONCOLOGY CONSULTATION NOTE  Date of Service: 09/20/2019  Patient Care Team: Tamsen Roers, MD as PCP - General (Family Medicine)  REFERRING PHYSICIAN: Tamsen Roers, MD  CHIEF COMPLAINTS/PURPOSE OF CONSULTATION:  Non-Hodgkin's Lymphoma  HISTORY OF PRESENTING ILLNESS:   Brooke [name] is a wonderful 74 y.o. female who has been referred to [place] by Tamsen Roers, MD for evaluation and management of Non-Hodgkin's Lymphoma. Pt is accompanied today by her sister. The pt reports that she is doing well overall.   The pt reports she is good. At one time she had a not under her armpit in the left side. They kept doing [place] for about a year and finally did a biopsy. The not to her disappeared and she did not feel it anymore. Pt had a US of the breast and tried to put it off due to having her COVID19 vaccine but they still had her coming in.   Over the last year pt has not had fever, chills, and  unexpected weight change. She has had night sweats but not drenching for several years now. Pt has lost weight due to stress over the last 2 months.   Of note prior to the patient's visit today, pt has had Surgical Pathology completed on 09/05/19 with results revealing "Lymph node, needle/core biopsy, left axilla CD5-POSITIVE NON-HODGKIN B-CELL LYMPHOMA, SEE COMMENT"  On review of systems, pt denies fevers, chills, unexpected weight changes, night sweats, new lumps/bumps, back pain, abdominal pain, skin rashes and any other symptoms.   On Social Hx the pt reports never smoked, social alcohol use  On Family Hx the pt reports many people in her family have had cancer, her second cousin had breast cancer in her early 38's.   MEDICAL HISTORY:  Past Medical History:  Diagnosis Date  . Allergy   . Arthritis   . Back pain   . Complication of anesthesia    bp dropped yrs ago, recent surgeries went ok  . Dysrhythmia    occ, no meds for  . GERD (gastroesophageal reflux disease)   .  H. pylori infection   . Headache    migraines occ  . Hiatal hernia   . Hx of adenomatous colonic polyps 11/30/2017  . Hyperlipidemia   . Hypothyroidism 1970's   hx of years ago, no meds for   . Shortness of breath dyspnea    with exertion, pt told comes from acid reflux     SURGICAL HISTORY: Past Surgical History:  Procedure Laterality Date  . APPENDECTOMY    . COLONOSCOPY  12/07/2013  . DILATION AND CURETTAGE OF UTERUS    . LAPAROSCOPIC CHOLECYSTECTOMY SINGLE SITE WITH INTRAOPERATIVE CHOLANGIOGRAM N/A 10/28/2015   Procedure: LAPAROSCOPIC CHOLECYSTECTOMY WITH INTRAOPERATIVE CHOLANGIOGRAM;  Surgeon: Armandina Gemma, MD;  Location: [facility];  Service: General;  Laterality: N/A;  . LAPAROSCOPY  yrs ago   x 2  . TMJ ARTHROSCOPY    . TOTAL ABDOMINAL HYSTERECTOMY     complete     SOCIAL HISTORY: Social History   Socioeconomic History  . Marital status: Married    Spouse name: Not on file  . Number of children: Not on file  . Years of education: Not on file  . Highest education level: Not on file  Occupational History  . Not on file  Tobacco Use  . Smoking status: Never Smoker  . Smokeless tobacco: Never Used  Vaping Use  . Vaping Use: Never used  Substance and Sexual Activity  . Alcohol use: Yes  Alcohol/week: 4.0 - 5.0 standard drinks    Types: 4 - 5 Cans of beer per week    Comment: 4-5 beers weekly-occ per pt  . Drug use: No  . Sexual activity: Not on file  Other Topics Concern  . Not on file  Social History Narrative  . Not on file   Social Determinants of Health   Financial Resource Strain:   . Difficulty of Paying Living Expenses:   Food Insecurity:   . Worried About Charity fundraiser in the Last Year:   . Arboriculturist in the Last Year:   Transportation Needs:   . Film/video editor (Medical):   [name] Kitchen Lack of Transportation (Non-Medical):   Physical Activity:   . Days of Exercise per Week:   . Minutes of Exercise per Session:   Stress:   .  Feeling of Stress :   Social Connections:   . Frequency of Communication with Friends and Family:   . Frequency of Social Gatherings with Friends and Family:   . Attends Religious Services:   . Active Member of Clubs or Organizations:   . Attends Archivist Meetings:   [name] Kitchen Marital Status:   Intimate Partner Violence:   . Fear of Current or Ex-Partner:   . Emotionally Abused:   [name] Kitchen Physically Abused:   . Sexually Abused:      FAMILY HISTORY: Family History  Problem Relation Age of Onset  . Colon cancer Father        early 65's  . Ovarian cancer Sister   . Kidney cancer Brother   . Colon polyps Brother   . Colon cancer Paternal Grandfather        unknown age of onset  . Lung cancer Brother   . Colon polyps Sister   . Breast cancer Maternal Aunt   . Breast cancer Cousin   . Rectal cancer Neg Hx   . Stomach cancer Neg Hx   . Esophageal cancer Neg Hx      ALLERGIES:   has No Known Allergies.   MEDICATIONS:  Current Outpatient Medications  Medication Sig Dispense Refill  . ALPRAZolam (XANAX) 0.5 MG tablet Take 0.5 mg by mouth at bedtime as needed for anxiety.     . cetirizine (ZYRTEC) 10 MG tablet Take 10 mg by mouth every evening.     . Cholecalciferol (VITAMIN D3) 5000 units CAPS Take 1 capsule by mouth daily.    . cyclobenzaprine (FLEXERIL) 10 MG tablet 10 mg.     . HYDROcodone-acetaminophen (NORCO/VICODIN) 5-325 MG tablet Take 1-2 tablets by mouth every 4 (four) hours as needed for moderate pain. 20 tablet 0  . ibuprofen (ADVIL,MOTRIN) 200 MG tablet Take 400 mg by mouth every 6 (six) hours as needed.    [name] Kitchen oxymetazoline (AFRIN) 0.05 % nasal spray Place 1 spray into both nostrils as needed for congestion.    . pantoprazole (PROTONIX) 40 MG tablet Take 1 tablet (40 mg total) by mouth every morning. Before breakfast (Patient taking differently: Take 40 mg by mouth every evening. Before breakfast) 90 tablet 1  . promethazine (PHENERGAN) 25 MG tablet Take 1 tablet (25  mg total) by mouth every 6 (six) hours as needed for nausea or vomiting. 10 tablet 0  . ranitidine (ZANTAC) 150 MG tablet Take 1 tablet (150 mg total) by mouth every evening. (Patient not taking: Reported on 10/31/2017) 90 tablet 1  . rosuvastatin (CRESTOR) 5 MG tablet Take 5 mg by mouth daily.    [name] Kitchen  Wheat Dextrin (BENEFIBER PO) Take by mouth as needed.     No current facility-administered medications for this visit.     REVIEW OF SYSTEMS:   A 10+ POINT REVIEW OF SYSTEMS WAS OBTAINED including neurology, dermatology, psychiatry, cardiac, respiratory, lymph, extremities, GI, GU, Musculoskeletal, constitutional, breasts, reproductive, HEENT.  All pertinent positives are noted in the HPI.  All others are negative.   PHYSICAL EXAMINATION: ECOG PERFORMANCE STATUS: 1 - Symptomatic but completely ambulatory  Vitals:   09/20/19 1501  BP: 134/83  Pulse: 84  Resp: 18  Temp: 99 F (37.2 C)  SpO2: 98%   Filed Weights   09/20/19 1501  Weight: 121 lb 12.8 oz (55.2 kg)   Body mass index is 21.58 kg/m.  GENERAL:alert, in no acute distress and comfortable SKIN: no acute rashes, no significant lesions EYES: conjunctiva are pink and non-injected, sclera anicteric OROPHARYNX: MMM, no exudates, no oropharyngeal erythema or ulceration NECK: supple, no JVD LYMPH:  no palpable lymphadenopathy in the inguinal regions. Palpable lymph nodes in axillary and cervicle regions LUNGS: clear to auscultation b/l with normal respiratory effort HEART: regular rate & rhythm ABDOMEN:  normoactive bowel sounds , non tender, not distended. Extremity: no pedal edema PSYCH: alert & oriented x 3 with fluent speech NEURO: no focal motor/sensory deficits  LABORATORY DATA:  I have reviewed the data as listed  CBC Latest Ref Rng & Units 10/24/2015 08/19/2015  WBC 4.0 - 10.5 K/uL 8.3 9.0  Hemoglobin 12.0 - 15.0 g/dL 12.6 12.6  Hematocrit 36 - 46 % 38.2 37.6  Platelets 150 - 400 K/uL 304 298.0    CMP Latest Ref Rng  & Units 08/22/2015 08/19/2015  Glucose 70 - 99 mg/dL 96 92  BUN 6 - 23 mg/dL 11 11  Creatinine 0.40 - 1.20 mg/dL 0.88 0.80  Sodium 135 - 145 mEq/L 136 139  Potassium 3.5 - 5.1 mEq/L 4.8 5.4(H)  Chloride 96 - 112 mEq/L 100 104  CO2 19 - 32 mEq/L 27 29  Calcium 8.4 - 10.5 mg/dL 9.7 10.2  Total Protein 6.0 - 8.3 g/dL - 7.7  Total Bilirubin 0.2 - 1.2 mg/dL - 0.4  Alkaline Phos 39 - 117 U/L - 67  AST 0 - 37 U/L - 21  ALT 0 - 35 U/L - 12   09/05/19 of Surgical Pathology     RADIOGRAPHIC STUDIES: I have personally reviewed the radiological images as listed and agreed with the findings in the report. [place] AXILLARY NODE CORE BIOPSY LEFT  Addendum Date: 09/11/2019   ADDENDUM REPORT: 09/11/2019 07:51 ADDENDUM: Pathology revealed CD5-POSITIVE NON-HODGKIN B-CELL LYMPHOMA of the LEFT axillary lymph node. This was found to be concordant by Dr. Kristopher Oppenheim. MICROSCOPIC COMMENT: Overall, the findings are consistent with a non-Hodgkin B-cell lymphoma. 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 Pathology results were discussed with the patient and her daughter by Dr. Thressa Sheller, pathologist by telephone. The patient reported doing well after the biopsy with tenderness at the site. Post biopsy instructions and care were reviewed and questions were answered. The patient was encouraged to call [facility] for any additional concerns. Results were called to Nile Riggs FNP of [facility]. A referral will be arranged with Hematologist/Oncologist. Pathology results reported by Stacie Acres RN on 09/11/2019. Electronically Signed   By: Kristopher Oppenheim M.D.   On: 09/11/2019 07:51   Result Date: 09/11/2019 CLINICAL DATA:  74 year old  female with a single enlarged left axillary lymph node. EXAM: [place] AXILLARY NODE CORE BIOPSY LEFT COMPARISON:  Previous exam(s). PROCEDURE: I met with the patient and we  discussed the procedure of ultrasound-guided biopsy, including benefits and alternatives. We discussed the high likelihood of a successful procedure. We discussed the risks of the procedure, including infection, bleeding, tissue injury, clip migration, and inadequate sampling. Informed written consent was given. The usual time-out protocol was performed immediately prior to the procedure. Using sterile technique and 1% Lidocaine as local anesthetic, under direct ultrasound visualization, a 14 gauge spring-loaded device was used to perform biopsy of a left axillary lymph node using a inferior approach. At the conclusion of the procedure Q shaped tissue marker clip was deployed into the biopsy cavity. The lymph node was sent to the lab in both formalin and saline. Follow up 2 view mammogram was not performed as the clip was well visualized sonographically. IMPRESSION: Ultrasound guided biopsy of a left axillary lymph node. No apparent complications. Electronically Signed: By: Kristopher Oppenheim M.D. On: 09/05/2019 14:49     ASSESSMENT & PLAN:   Rica Heather is a 74 y.o. female with:  1. 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  PLAN: -Discussed 09/05/19 of Surgical Pathology -concerning for mantle cell lymphoma versus SLL.  Cyclin D1 positivity is more concerning for mantle cell lymphoma.  FISH studies to confirm mantle cell lymphoma currently pending.  I called and discussed this with Dr. Melina Copa. -Advised on diagnostic possibilities and further testing. -Advised CD5-Positive has 2 possibilities small lymphocytic lymphoma or mantle cell lymphoma  -Advised on mantle cell lymphoma is more variable in speed and can be more aggressive and typically needs immediate treatment and small lymphocytic lymphoma is slow growing and could potentially be monitored. -Advised if small lymphocytic lymphoma would watch until it becomes bothersome  -Advised  mantle cell lymphoma would need treatment on diagnosis -Advised both are very treatable but may not be completely curable. -Advised keeping up with healthy appetite -Recommended that the pt continue to eat well, drink at least 48-64 oz of water each day, and walk 20-30 minutes each day.  -Will get PET/CT in 1 week for initial staging of newly diagnosed mantle cell lymphoma -Will get labs today  -Will see back in 2 weeks  [place] today PET/CT in 1 week RTC with Dr Irene Limbo in 2 weeks +/-2 days (okay to add at end of day if no other appointment slots available)  . Orders Placed This Encounter  Procedures  . NM PET Image Initial (PI) Skull Base To Thigh    Standing Status:   Future    Standing Expiration Date:   09/19/2020    Order Specific Question:   If indicated for the ordered procedure, I authorize the administration of a radiopharmaceutical per Radiology protocol    Answer:   Yes    Order Specific Question:   Preferred imaging location?    Answer:   [facility]    Order Specific Question:   Radiology Contrast Protocol - do NOT remove file path    Answer:   [email or web address]  . CBC with Differential/Platelet    Standing Status:   Future    Number of Occurrences:   1    Standing Expiration Date:   09/19/2020  . CMP ([place] only)    Standing Status:   Future    Number of Occurrences:   1    Standing Expiration Date:  09/19/2020  . Lactate dehydrogenase    Standing Status:   Future    Number of Occurrences:   1    Standing Expiration Date:   09/19/2020  . Hepatitis C antibody    Standing Status:   Future    Number of Occurrences:   1    Standing Expiration Date:   09/19/2020  . Hepatitis B surface antigen    Standing Status:   Future    Number of Occurrences:   1    Standing Expiration Date:   09/19/2020  . Hepatitis B core antibody, total    Standing Status:   Future    Number of Occurrences:   1    Standing Expiration Date:   09/19/2020    . FISH, CLL Prognostic Panel    Standing Status:   Future    Number of Occurrences:   1    Standing Expiration Date:   09/19/2020    The total time spent in the appt was 60 minutes and more than 50% was on counseling and direct patient cares.  All of the patient's questions were answered with apparent satisfaction. The patient knows to call the clinic with any problems, questions or concerns.   Sullivan Lone MD MS AAHIVMS [facility] Hematology/Oncology Physician [facility]  (Office):       [phone] (Work cell):  [phone] (Fax):           [phone]  09/20/2019 3:46 PM  I, Dawayne Cirri am acting as a scribe for Dr. Sullivan Lone.   .I have reviewed the above documentation for accuracy and completeness, and I agree with the above. Brunetta Genera MD

## 2019-09-20 ENCOUNTER — Inpatient Hospital Stay: Payer: Medicare HMO

## 2019-09-20 ENCOUNTER — Other Ambulatory Visit: Payer: Self-pay

## 2019-09-20 ENCOUNTER — Inpatient Hospital Stay: Payer: Medicare HMO | Attending: Hematology | Admitting: Hematology

## 2019-09-20 VITALS — BP 134/83 | HR 84 | Temp 99.0°F | Resp 18 | Ht 63.0 in | Wt 121.8 lb

## 2019-09-20 DIAGNOSIS — Z8371 Family history of colonic polyps: Secondary | ICD-10-CM

## 2019-09-20 DIAGNOSIS — Z8051 Family history of malignant neoplasm of kidney: Secondary | ICD-10-CM | POA: Insufficient documentation

## 2019-09-20 DIAGNOSIS — Z801 Family history of malignant neoplasm of trachea, bronchus and lung: Secondary | ICD-10-CM | POA: Diagnosis not present

## 2019-09-20 DIAGNOSIS — Z800 Family history of malignant neoplasm of digestive organs: Secondary | ICD-10-CM | POA: Insufficient documentation

## 2019-09-20 DIAGNOSIS — Z803 Family history of malignant neoplasm of breast: Secondary | ICD-10-CM | POA: Diagnosis not present

## 2019-09-20 DIAGNOSIS — C8594 Non-Hodgkin lymphoma, unspecified, lymph nodes of axilla and upper limb: Secondary | ICD-10-CM | POA: Insufficient documentation

## 2019-09-20 DIAGNOSIS — Z8041 Family history of malignant neoplasm of ovary: Secondary | ICD-10-CM | POA: Diagnosis not present

## 2019-09-20 LAB — HEPATITIS C ANTIBODY: HCV Ab: NONREACTIVE

## 2019-09-20 LAB — CMP (CANCER CENTER ONLY)
ALT: <6 U/L (ref 0–44)
AST: 13 U/L — ABNORMAL LOW (ref 15–41)
Albumin: 4.2 g/dL (ref 3.5–5.0)
Alkaline Phosphatase: 83 U/L (ref 38–126)
Anion gap: 12 (ref 5–15)
BUN: 9 mg/dL (ref 8–23)
CO2: 21 mmol/L — ABNORMAL LOW (ref 22–32)
Calcium: 9.7 mg/dL (ref 8.9–10.3)
Chloride: 105 mmol/L (ref 98–111)
Creatinine: 0.83 mg/dL (ref 0.44–1.00)
GFR, Est AFR Am: >60 mL/min (ref >60)
GFR, Estimated: >60 mL/min (ref >60)
Glucose, Bld: 82 mg/dL (ref 70–99)
Potassium: 4 mmol/L (ref 3.5–5.1)
Sodium: 138 mmol/L (ref 135–145)
Total Bilirubin: 0.8 mg/dL (ref 0.3–1.2)
Total Protein: 7.2 g/dL (ref 6.5–8.1)

## 2019-09-20 LAB — CBC WITH DIFFERENTIAL/PLATELET
Abs Immature Granulocytes: 0.02 10*3/uL (ref 0.00–0.07)
Basophils Absolute: 0 10*3/uL (ref 0.0–0.1)
Basophils Relative: 0 %
Eosinophils Absolute: 0.1 10*3/uL (ref 0.0–0.5)
Eosinophils Relative: 1 %
HCT: 35.4 % — ABNORMAL LOW (ref 36.0–46.0)
Hemoglobin: 11.8 g/dL — ABNORMAL LOW (ref 12.0–15.0)
Immature Granulocytes: 0 %
Lymphocytes Relative: 22 %
Lymphs Abs: 1.5 10*3/uL (ref 0.7–4.0)
MCH: 31.1 pg (ref 26.0–34.0)
MCHC: 33.3 g/dL (ref 30.0–36.0)
MCV: 93.4 fL (ref 80.0–100.0)
Monocytes Absolute: 0.8 10*3/uL (ref 0.1–1.0)
Monocytes Relative: 11 %
Neutro Abs: 4.6 10*3/uL (ref 1.7–7.7)
Neutrophils Relative %: 66 %
Platelets: 263 10*3/uL (ref 150–400)
RBC: 3.79 MIL/uL — ABNORMAL LOW (ref 3.87–5.11)
RDW: 12.7 % (ref 11.5–15.5)
WBC: 7 10*3/uL (ref 4.0–10.5)
nRBC: 0 % (ref 0.0–0.2)

## 2019-09-20 LAB — HEPATITIS B SURFACE ANTIGEN: Hepatitis B Surface Ag: NONREACTIVE

## 2019-09-20 LAB — HEPATITIS B CORE ANTIBODY, TOTAL: Hep B Core Total Ab: NONREACTIVE

## 2019-09-21 LAB — LACTATE DEHYDROGENASE: LDH: 174 U/L (ref 98–192)

## 2019-09-25 ENCOUNTER — Telehealth: Payer: Self-pay | Admitting: Hematology

## 2019-09-25 NOTE — Telephone Encounter (Signed)
Scheduled per los, patient has been called and notified. 

## 2019-09-26 ENCOUNTER — Telehealth: Payer: Self-pay | Admitting: Hematology

## 2019-09-26 NOTE — Telephone Encounter (Signed)
Released ov note to [facility] to [phone]    Release:  [id number]

## 2019-10-01 LAB — FISH,CLL PROGNOSTIC PANEL

## 2019-10-02 ENCOUNTER — Ambulatory Visit (HOSPITAL_COMMUNITY)
Admission: RE | Admit: 2019-10-02 | Discharge: 2019-10-02 | Disposition: A | Discharge status: Home / Self Care | Payer: Medicare HMO | Source: Ambulatory Visit | Attending: Hematology | Admitting: Hematology

## 2019-10-02 ENCOUNTER — Other Ambulatory Visit: Payer: Self-pay

## 2019-10-02 ENCOUNTER — Inpatient Hospital Stay: Payer: Medicare HMO | Admitting: Hematology

## 2019-10-02 VITALS — BP 149/90 | HR 92 | Temp 99.2°F | Resp 18 | Ht 63.0 in | Wt 124.6 lb

## 2019-10-02 DIAGNOSIS — C8594 Non-Hodgkin lymphoma, unspecified, lymph nodes of axilla and upper limb: Secondary | ICD-10-CM | POA: Diagnosis not present

## 2019-10-02 DIAGNOSIS — C8318 Mantle cell lymphoma, lymph nodes of multiple sites: Secondary | ICD-10-CM

## 2019-10-02 DIAGNOSIS — Z7189 Other specified counseling: Secondary | ICD-10-CM

## 2019-10-02 LAB — GLUCOSE, CAPILLARY: Glucose-Capillary: 96 mg/dL (ref 70–99)

## 2019-10-02 MED ORDER — FLUDEOXYGLUCOSE F - 18 (FDG) INJECTION
6.1000 | Freq: Once | INTRAVENOUS | Status: AC | PRN
  Administered 2019-10-02: 6.1 via INTRAVENOUS

## 2019-10-02 NOTE — Progress Notes (Signed)
HEMATOLOGY/ONCOLOGY CONSULTATION NOTE  Date of Service: 10/02/2019  Patient Care Team: Tamsen Roers, MD as PCP - General (Family Medicine)  REFERRING PHYSICIAN: Tamsen Roers, MD  CHIEF COMPLAINTS/PURPOSE OF CONSULTATION:  Non-Hodgkin's Lymphoma  HISTORY OF PRESENTING ILLNESS:  Brooke [name] is a wonderful 74 y.o. female who has been referred to [place] by Tamsen Roers, MD for evaluation and management of Non-Hodgkin's Lymphoma. Pt is accompanied today by her sister. The pt reports that she is doing well overall.   The pt reports she is good. At one time she had a not under her armpit in the left side. They kept doing [place] for about a year and finally did a biopsy. The not to her disappeared and she did not feel it anymore. Pt had a US of the breast and tried to put it off due to having her COVID19 vaccine but they still had her coming in.   Over the last year pt has not had fever, chills, and  unexpected weight change. She has had night sweats but not drenching for several years now. Pt has lost weight due to stress over the last 2 months.   Of note prior to the patient's visit today, pt has had Surgical Pathology completed on 09/05/19 with results revealing "Lymph node, needle/core biopsy, left axilla CD5-POSITIVE NON-HODGKIN B-CELL LYMPHOMA, SEE COMMENT"  On review of systems, pt denies fevers, chills, unexpected weight changes, night sweats, new lumps/bumps, back pain, abdominal pain, skin rashes and any other symptoms.   On Social Hx the pt reports never smoked, social alcohol use  On Family Hx the pt reports many people in her family have had cancer, her second cousin had breast cancer in her early 25's.   INTERVAL HISTORY: Brooke [name] is a wonderful 74 y.o. female who is here for evaluation and management of Non-Hodgkin's Lymphoma. The patient's last visit with [place] was on 09/20/2019. The pt reports that she is doing well overall.  The pt reports that  she has not had any new symptoms. She continues to feel stable, but is worried about her husband's recent medical issues.   Of note since the patient's last visit, pt has had FISH (CLL Prognostic) Panel completed on 09/20/2019 with results revealing "No mutations identified".  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 Spleen is normal in size."  Lab results (09/20/19) of CBC w/diff and CMP is as follows: all values are WNL except for RBC at 3.79, Hgb at 11.8, HCT at 35.4, CO2 at 21, AST at 13. 09/20/2019 LDH at 174 09/20/2019 HCV Ab is "Non Reactive" 09/20/2019 Hep B Core Total Ab is "Non Reactive" 09/20/2019 Hepatitis B Surface Ag is "Non Reactive"  On review of systems, pt reports stress and denies diarrhea, bloody/black stools, unexpected weight loss, abdominal pain and any other symptoms.   MEDICAL HISTORY:  Past Medical History:  Diagnosis Date  . Allergy   . Arthritis   . Back pain   . Complication of anesthesia    bp dropped yrs ago, recent surgeries went ok  . Dysrhythmia    occ, no  meds for  . GERD (gastroesophageal reflux disease)   . H. pylori infection   . Headache    migraines occ  . Hiatal hernia   . Hx of adenomatous colonic polyps 11/30/2017  . Hyperlipidemia   . Hypothyroidism 1970's   hx of years ago, no meds for   . Shortness of breath dyspnea    with exertion, pt told comes from acid reflux     SURGICAL HISTORY: Past Surgical History:  Procedure Laterality Date  .  APPENDECTOMY    . COLONOSCOPY  12/07/2013  . DILATION AND CURETTAGE OF UTERUS    . LAPAROSCOPIC CHOLECYSTECTOMY SINGLE SITE WITH INTRAOPERATIVE CHOLANGIOGRAM N/A 10/28/2015   Procedure: LAPAROSCOPIC CHOLECYSTECTOMY WITH INTRAOPERATIVE CHOLANGIOGRAM;  Surgeon: Armandina Gemma, MD;  Location: [facility];  Service: General;  Laterality: N/A;  . LAPAROSCOPY  yrs ago   x 2  . TMJ ARTHROSCOPY    . TOTAL ABDOMINAL HYSTERECTOMY     complete     SOCIAL HISTORY: Social History   Socioeconomic History  . Marital status: Married    Spouse name: Not on file  . Number of children: Not on file  . Years of education: Not on file  . Highest education level: Not on file  Occupational History  . Not on file  Tobacco Use  . Smoking status: Never Smoker  . Smokeless tobacco: Never Used  Vaping Use  . Vaping Use: Never used  Substance and Sexual Activity  . Alcohol use: Yes    Alcohol/week: 4.0 - 5.0 standard drinks    Types: 4 - 5 Cans of beer per week    Comment: 4-5 beers weekly-occ per pt  . Drug use: No  . Sexual activity: Not on file  Other Topics Concern  . Not on file  Social History Narrative  . Not on file   Social Determinants of Health   Financial Resource Strain:   . Difficulty of Paying Living Expenses: Not on file  Food Insecurity:   . Worried About Charity fundraiser in the Last Year: Not on file  . Ran Out of Food in the Last Year: Not on file  Transportation Needs:   . Lack of Transportation (Medical): Not on file  . Lack of Transportation (Non-Medical): Not on file  Physical Activity:   . Days of Exercise per Week: Not on file  . Minutes of Exercise per Session: Not on file  Stress:   . Feeling of Stress : Not on file  Social Connections:   . Frequency of Communication with Friends and Family: Not on file  . Frequency of Social Gatherings with Friends and Family: Not on file  . Attends Religious Services: Not on file  . Active Member of Clubs or Organizations: Not on  file  . Attends Archivist Meetings: Not on file  . Marital Status: Not on file  Intimate Partner Violence:   . Fear of Current or Ex-Partner: Not on file  . Emotionally Abused: Not on file  . Physically Abused: Not on file  . Sexually Abused: Not on file     FAMILY HISTORY: Family History  Problem Relation Age of Onset  . Colon cancer Father        early 73's  . Ovarian cancer Sister   . Kidney cancer Brother   . Colon polyps Brother   . Colon cancer Paternal Grandfather        unknown age of onset  . Lung cancer Brother   .  Colon polyps Sister   . Breast cancer Maternal Aunt   . Breast cancer Cousin   . Rectal cancer Neg Hx   . Stomach cancer Neg Hx   . Esophageal cancer Neg Hx      ALLERGIES:   has No Known Allergies.   MEDICATIONS:  Current Outpatient Medications  Medication Sig Dispense Refill  . ALPRAZolam (XANAX) 0.5 MG tablet Take 0.5 mg by mouth at bedtime as needed for anxiety.     . Cholecalciferol (VITAMIN D3) 5000 units CAPS Take 1 capsule by mouth daily.    . cyclobenzaprine (FLEXERIL) 10 MG tablet 10 mg.     . HYDROcodone-acetaminophen (NORCO/VICODIN) 5-325 MG tablet Take 1-2 tablets by mouth every 4 (four) hours as needed for moderate pain. 20 tablet 0  . ibuprofen (ADVIL,MOTRIN) 200 MG tablet Take 400 mg by mouth every 6 (six) hours as needed.    [name] Kitchen oxymetazoline (AFRIN) 0.05 % nasal spray Place 1 spray into both nostrils as needed for congestion.    . promethazine (PHENERGAN) 25 MG tablet Take 1 tablet (25 mg total) by mouth every 6 (six) hours as needed for nausea or vomiting. 10 tablet 0  . rosuvastatin (CRESTOR) 5 MG tablet Take 5 mg by mouth daily.    . pantoprazole (PROTONIX) 40 MG tablet Take 1 tablet (40 mg total) by mouth every morning. Before breakfast (Patient taking differently: Take 40 mg by mouth every evening. Before breakfast) 90 tablet 1   No current facility-administered medications for this visit.     REVIEW OF SYSTEMS:    A 10+ POINT REVIEW OF SYSTEMS WAS OBTAINED including neurology, dermatology, psychiatry, cardiac, respiratory, lymph, extremities, GI, GU, Musculoskeletal, constitutional, breasts, reproductive, HEENT.  All pertinent positives are noted in the HPI.  All others are negative.   PHYSICAL EXAMINATION: ECOG PERFORMANCE STATUS: 1 - Symptomatic but completely ambulatory  Vitals:   10/02/19 1440  BP: (!) 149/90  Pulse: 92  Resp: 18  Temp: 99.2 F (37.3 C)  SpO2: 99%   Filed Weights   10/02/19 1440  Weight: 124 lb 9.6 oz (56.5 kg)   Body mass index is 22.07 kg/m.   GENERAL:alert, in no acute distress and comfortable SKIN: no acute rashes, no significant lesions EYES: conjunctiva are pink and non-injected, sclera anicteric OROPHARYNX: MMM, no exudates, no oropharyngeal erythema or ulceration NECK: supple, no JVD LYMPH:  no palpable lymphadenopathy in the inguinal region. Palpable lymph nodes in axillary and cervical regions. LUNGS: clear to auscultation b/l with normal respiratory effort HEART: regular rate & rhythm ABDOMEN:  normoactive bowel sounds , non tender, not distended. No palpable hepatosplenomegaly.  Extremity: no pedal edema PSYCH: alert & oriented x 3 with fluent speech NEURO: no focal motor/sensory deficits  LABORATORY DATA:  I have reviewed the data as listed  CBC Latest Ref Rng & Units 09/20/2019 10/24/2015 08/19/2015  WBC 4.0 - 10.5 K/uL 7.0 8.3 9.0  Hemoglobin 12.0 - 15.0 g/dL 11.8(L) 12.6 12.6  Hematocrit 36 - 46 % 35.4(L) 38.2 37.6  Platelets 150 - 400 K/uL 263 304 298.0    CMP Latest Ref Rng & Units 09/20/2019 08/22/2015 08/19/2015  Glucose 70 - 99 mg/dL 82 96 92  BUN 8 - 23 mg/dL 9 11 11   Creatinine 0.44 - 1.00 mg/dL 0.83 0.88 0.80  Sodium 135 - 145 mmol/L 138 136 139  Potassium 3.5 - 5.1 mmol/L 4.0 4.8 5.4(H)  Chloride 98 - 111 mmol/L 105 100 104  CO2 22 - 32 mmol/L 21(L) 27  29  Calcium 8.9 - 10.3 mg/dL 9.7 9.7 10.2  Total Protein 6.5 - 8.1 g/dL 7.2 - 7.7   Total Bilirubin 0.3 - 1.2 mg/dL 0.8 - 0.4  Alkaline Phos 38 - 126 U/L 83 - 67  AST 15 - 41 U/L 13(L) - 21  ALT 0 - 44 U/L <6 - 12   09/20/2019 FISH (CLL Prognostic) Panel:   09/05/19 Surgical Pathology Addendum:   09/05/19 Surgical Pathology     RADIOGRAPHIC STUDIES: I have personally reviewed the radiological images as listed and agreed with the findings in the report. NM PET Image Initial (PI) Skull Base To Thigh  Result Date: 10/02/2019 CLINICAL DATA:  Initial treatment strategy for mantle cell lymphoma. EXAM: NUCLEAR MEDICINE PET SKULL BASE TO THIGH TECHNIQUE: 6.1 mCi F-18 FDG was injected intravenously. Full-ring PET imaging was performed from the skull base to thigh after the radiotracer. CT data was obtained and used for attenuation correction and anatomic localization. Fasting blood glucose: 96 mg/dl COMPARISON:  None. FINDINGS: Mediastinal blood pool activity: SUV max 2.6 Liver activity: SUV max NA NECK: Multiple small bilateral cervical nodes, including a 9 mm short axis left level 2 node (series 4/image 29), max SUV 2.3. Incidental CT findings: none CHEST: Multiple bilateral axillary nodes, including an 11 mm short axis left axillary node (series 4/image 85), max SUV 3.6. Adjacent surgical clip, indicating this likely corresponds to the patient's biopsy-proven mantle cell lymphoma. No hypermetabolic mediastinal or hilar lymphadenopathy. No suspicious pulmonary nodules. Incidental CT findings: Atherosclerotic calcifications of the descending thoracic aorta. Mild coronary atherosclerosis of the LAD. ABDOMEN/PELVIS: Small retroperitoneal/pelvic lymph nodes, including: --8 mm short axis left external iliac node (series 4/image 138), max SUV 3.1 --Bilateral pelvic sidewall nodes measuring up to 8 mm short axis on the left (series 4/image 164), max SUV 4.0 --Bilateral inguinal nodes measuring up to 8 mm short axis on the left (series 4/image 175), max SUV 2.9 Spleen is normal in size,  without associated hypermetabolism. No abnormal metabolism in the liver, pancreas, or adrenal glands. Incidental CT findings: Status post cholecystectomy. Atherosclerotic calcifications the abdominal aorta and branch vessels. Status post hysterectomy. Trace pelvic ascites. SKELETON: No focal hypermetabolic activity to suggest skeletal metastasis. Incidental CT findings: Degenerative changes of the visualized thoracolumbar spine. IMPRESSION: Small cervical, bilateral axillary, retroperitoneal, and bilateral pelvic lymph nodes, corresponding to the patient's known mantle cell lymphoma, as above. Spleen is normal in size. Electronically Signed   By: Julian Hy M.D.   On: 10/02/2019 17:00   [place] AXILLARY NODE CORE BIOPSY LEFT  Addendum Date: 09/11/2019   ADDENDUM REPORT: 09/11/2019 07:51 ADDENDUM: Pathology revealed CD5-POSITIVE NON-HODGKIN B-CELL LYMPHOMA of the LEFT axillary lymph node. This was found to be concordant by Dr. Kristopher Oppenheim. MICROSCOPIC COMMENT: Overall, the findings are consistent with a non-Hodgkin B-cell lymphoma. 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 Pathology results were discussed with the patient and her daughter by Dr. Thressa Sheller, pathologist by telephone. The patient reported doing well after the biopsy with tenderness at the site. Post biopsy instructions and care were reviewed and questions were answered. The patient was encouraged to call [facility] for any additional concerns. Results were called to [facility] Riggs FNP [facility]. A referral will be arranged with Hematologist/Oncologist. Pathology results reported by Stacie Acres RN on 09/11/2019. Electronically Signed   By: Kristopher Oppenheim M.D.   On: 09/11/2019 07:51  Result Date: 09/11/2019 CLINICAL DATA:  74 year old female with a single enlarged left axillary lymph node. EXAM: [place] AXILLARY NODE CORE  BIOPSY LEFT COMPARISON:  Previous exam(s). PROCEDURE: I met with the patient and we discussed the procedure of ultrasound-guided biopsy, including benefits and alternatives. We discussed the high likelihood of a successful procedure. We discussed the risks of the procedure, including infection, bleeding, tissue injury, clip migration, and inadequate sampling. Informed written consent was given. The usual time-out protocol was performed immediately prior to the procedure. Using sterile technique and 1% Lidocaine as local anesthetic, under direct ultrasound visualization, a 14 gauge spring-loaded device was used to perform biopsy of a left axillary lymph node using a inferior approach. At the conclusion of the procedure Q shaped tissue marker clip was deployed into the biopsy cavity. The lymph node was sent to the lab in both formalin and saline. Follow up 2 view mammogram was not performed as the clip was well visualized sonographically. IMPRESSION: Ultrasound guided biopsy of a left axillary lymph node. No apparent complications. Electronically Signed: By: Kristopher Oppenheim M.D. On: 09/05/2019 14:49     ASSESSMENT & PLAN:   Aava Deland is a 74 y.o. female with:  1. 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  PLAN: -Discussed pt labwork, 09/20/19; minimal anemia, other blood counts and chemistries look good, LDH is WNL, Hepatitis w/o is negative.  -Discussed 09/20/2019 FISH (CLL Prognostic) Panel which revealed "No mutations identified". -Discussed 09/05/2019 Surgical Pathology addendum which revealed "CCN D1 (BCL-1) translocation: DETECTED - The CCND1/IgH t(11;14) translocation is highly specific (70 to 95%) for mantle cell lymphoma (MCL) and can be used for diagnosis, monitoring efficacy of therapy and the detection of minimal residual disease." -Discussed 10/02/2019 PET/CT ([id number]) which revealed "Small cervical, bilateral axillary,  retroperitoneal, and bilateral pelvic lymph nodes, corresponding to the patient's known mantle cell lymphoma, as above. Spleen is normal in size." -Advised pt that she does not have the most aggressive version of Mantle Cell lymphoma. No evidence of pleomorphic or blastoid variant.  -Advised pt that it is difficult to know if her Mantle Cell Lymphoma will be have average or indolently.  -Advised pt that Mantle Cell Lymphoma is not considered cureable, but is treatable and can be put into remission.  -Advised pt that we would not recommed autologous transplant as it is not curative and other treatments have similar sucess rates. -Advised pt that we would plan to treat with 4-6 cycles of chemotherapy and repeat PET/CT after three cycles.  -Discussed symptoms of Mantle Cell Lymphoma progression including: diarrhea, bloody/black stools, frequent infections, worsening anemia. -Discussed continuing maintentance treatment with Rituxan after induction chemotherapy.  -Discussed side affects associated with chemotherapy, such as: loss of hair and nausea. -Will set up chemo-counseling for Bendamustine/Rituxan -Will begin Bendamustine/Rituxan in 2 weeks  -Will see back with C1D1   FOLLOW UP -Chemotherapy counseling for Bendamustine/Rituxan -Plz schedule to start Bendamustine/Rituxan (D1 and D2 of treatment) in 2weeks -Labs and MD visit with C1D1 of treatment and 2 weeks later I.e. C1D15 (for toxicity check)   The total time spent in the appt was 40 minutes and more than 50% was on counseling and direct patient cares.  All of the patient's questions were answered with apparent satisfaction. The patient knows to call the clinic with any problems, questions or concerns.    Sullivan Lone MD [place] AAHIVMS [address] [facility] Hematology/Oncology Physician [facility]  (Office):       [phone] (  Work cell):  [phone] (Fax):           [phone]  10/02/2019 5:17 PM  I, Yevette Edwards, am acting  as a scribe for Dr. Sullivan Lone.   .I have reviewed the above documentation for accuracy and completeness, and I agree with the above. Brunetta Genera MD

## 2019-10-08 ENCOUNTER — Other Ambulatory Visit: Payer: Self-pay | Admitting: Hematology

## 2019-10-08 ENCOUNTER — Telehealth: Payer: Self-pay | Admitting: Hematology

## 2019-10-08 DIAGNOSIS — C8318 Mantle cell lymphoma, lymph nodes of multiple sites: Secondary | ICD-10-CM

## 2019-10-08 DIAGNOSIS — Z7189 Other specified counseling: Secondary | ICD-10-CM

## 2019-10-08 HISTORY — DX: Other specified counseling: Z71.89

## 2019-10-08 HISTORY — DX: Mantle cell lymphoma, lymph nodes of multiple sites: C83.18

## 2019-10-08 MED ORDER — LORAZEPAM 0.5 MG PO TABS: 0.5000 mg | ORAL_TABLET | Freq: Four times a day (QID) | ORAL | 0 refills | Status: DC | PRN

## 2019-10-08 MED ORDER — DEXAMETHASONE 4 MG PO TABS: 8.0000 mg | ORAL_TABLET | Freq: Every day | ORAL | 1 refills | Status: DC

## 2019-10-08 MED ORDER — ACYCLOVIR 400 MG PO TABS: 400.0000 mg | ORAL_TABLET | Freq: Two times a day (BID) | ORAL | 3 refills | Status: DC

## 2019-10-08 MED ORDER — PROCHLORPERAZINE MALEATE 10 MG PO TABS: 10.0000 mg | ORAL_TABLET | Freq: Four times a day (QID) | ORAL | 1 refills | Status: DC | PRN

## 2019-10-08 MED ORDER — ONDANSETRON HCL 8 MG PO TABS: 8.0000 mg | ORAL_TABLET | Freq: Two times a day (BID) | ORAL | 1 refills | Status: DC | PRN

## 2019-10-08 NOTE — Telephone Encounter (Signed)
Called patient regarding 09/01 appointment, patient is notified.

## 2019-10-08 NOTE — Progress Notes (Signed)
START ON PATHWAY REGIMEN - Lymphoma and CLL     A cycle is every 28 days:     Bendamustine      Rituximab-xxxx   **Always confirm dose/schedule in your pharmacy ordering system**  Patient Characteristics: Mantle Cell Lymphoma, First Line, Aggressive Disease or Treatment Indicated, Stage II - IV, Not a Transplant Candidate Disease Type: Not Applicable Disease Type: Mantle Cell Lymphoma Disease Type: Not Applicable Line of Therapy: First Line Patient Characteristics: Not a Transplant Candidate Intent of Therapy: Non-Curative / Palliative Intent, Discussed with Patient

## 2019-10-10 ENCOUNTER — Inpatient Hospital Stay: Payer: Medicare HMO | Attending: Hematology

## 2019-10-10 ENCOUNTER — Other Ambulatory Visit: Payer: Self-pay

## 2019-10-10 DIAGNOSIS — Z5111 Encounter for antineoplastic chemotherapy: Secondary | ICD-10-CM | POA: Insufficient documentation

## 2019-10-10 DIAGNOSIS — C8318 Mantle cell lymphoma, lymph nodes of multiple sites: Secondary | ICD-10-CM | POA: Insufficient documentation

## 2019-10-10 DIAGNOSIS — Z5112 Encounter for antineoplastic immunotherapy: Secondary | ICD-10-CM | POA: Insufficient documentation

## 2019-10-12 ENCOUNTER — Telehealth: Payer: Self-pay | Admitting: Hematology

## 2019-10-12 NOTE — Progress Notes (Signed)
Pharmacist Chemotherapy Monitoring - Initial Assessment    Anticipated start date: 10/19/19    Regimen:  . Are orders appropriate based on the patient's diagnosis, regimen, and cycle? Yes . Does the plan date match the patient's scheduled date? Yes . Is the sequencing of drugs appropriate? Yes . Are the premedications appropriate for the patient's regimen? Yes . Prior Authorization for treatment is: Pending o If applicable, is the correct biosimilar selected based on the patient's insurance? not applicable  Organ Function and Labs: [name] Kitchen Are dose adjustments needed based on the patient's renal function, hepatic function, or hematologic function? No . Are appropriate labs ordered prior to the start of patient's treatment? Yes . Other organ system assessment, if indicated: N/A . The following baseline labs, if indicated, have been ordered: rituximab: baseline Hepatitis B labs  Dose Assessment: . Are the drug doses appropriate? Yes . Are the following correct: o Drug concentrations Yes o IV fluid compatible with drug Yes o Administration routes Yes o Timing of therapy Yes . If applicable, does the patient have documented access for treatment and/or plans for port-a-cath placement? not applicable . If applicable, have lifetime cumulative doses been properly documented and assessed? not applicable Lifetime Dose Tracking  No doses have been documented on this patient for the following tracked chemicals: Doxorubicin, Epirubicin, Idarubicin, Daunorubicin, Mitoxantrone, Bleomycin, Oxaliplatin, Carboplatin, Liposomal Doxorubicin  o   Toxicity Monitoring/Prevention: . The patient has the following take home antiemetics prescribed: Prochlorperazine and Lorazepam . The patient has the following take home medications prescribed: VZV prophylaxis . Medication allergies and previous infusion related reactions, if applicable, have been reviewed and addressed. Yes . The patient's current medication list  has been assessed for drug-drug interactions with their chemotherapy regimen. no significant drug-drug interactions were identified on review.  Order Review: . Are the treatment plan orders signed? Yes . Is the patient scheduled to see a provider prior to their treatment? Yes  I verify that I have reviewed each item in the above checklist and answered each question accordingly.  Wendelin Reader K 10/12/2019 9:04 AM

## 2019-10-12 NOTE — Telephone Encounter (Signed)
Scheduled per 08/24 los, patient has been called and notified of upcoming appointments.  

## 2019-10-16 ENCOUNTER — Encounter: Payer: Self-pay | Admitting: Hematology

## 2019-10-16 NOTE — Progress Notes (Signed)
Called pt to introduce myself as her Arboriculturist, discuss copay assistance and the [employer].  Pt informed me that she applied to [employer] for copay assistance so I got the physician form signed and uploaded it to the [employer] website.  I will notify the pt of the outcome once I receive it.  I also informed her of the [employer] and approved her because her husband was approved for the grant and I reminded her that he still has funds available.  She verbalized understanding.

## 2019-10-18 NOTE — Progress Notes (Signed)
HEMATOLOGY/ONCOLOGY CLINIC NOTE  Date of Service: 10/19/2019  Patient Care Team: Tamsen Roers, MD as PCP - General (Family Medicine)  REFERRING PHYSICIAN: Tamsen Roers, MD  CHIEF COMPLAINTS/PURPOSE OF CONSULTATION:  Non-Hodgkin's Lymphoma  HISTORY OF PRESENTING ILLNESS:  Brooke [name] is a wonderful 74 y.o. female who has been referred to [place] by Tamsen Roers, MD for evaluation and management of Non-Hodgkin's Lymphoma. Pt is accompanied today by her sister. The pt reports that she is doing well overall.   The pt reports she is good. At one time she had a not under her armpit in the left side. They kept doing [place] for about a year and finally did a biopsy. The not to her disappeared and she did not feel it anymore. Pt had a US of the breast and tried to put it off due to having her COVID19 vaccine but they still had her coming in.   Over the last year pt has not had fever, chills, and  unexpected weight change. She has had night sweats but not drenching for several years now. Pt has lost weight due to stress over the last 2 months.   Of note prior to the patient's visit today, pt has had Surgical Pathology completed on 09/05/19 with results revealing "Lymph node, needle/core biopsy, left axilla CD5-POSITIVE NON-HODGKIN B-CELL LYMPHOMA, SEE COMMENT"  On review of systems, pt denies fevers, chills, unexpected weight changes, night sweats, new lumps/bumps, back pain, abdominal pain, skin rashes and any other symptoms.   On Social Hx the pt reports never smoked, social alcohol use  On Family Hx the pt reports many people in her family have had cancer, her second cousin had breast cancer in her early 60's.   INTERVAL HISTORY: Brooke [name] is a wonderful 74 y.o. female who is here for evaluation and management of Non-Hodgkin's Lymphoma. The patient's last visit with [place] was on 10/02/2019. The pt reports that she is doing well overall.  The pt reports that she  has been well and denies any new concerns. Pt is interested in continuing to dye her hair. She denies any previous reactions with her current hair dye.   Lab results today (10/19/19) of CBC w/diff and CMP is as follows: all values are WNL except for Glucose at 114, AST at 14.  On review of systems, pt denies fevers, chills, abdominal pain, leg swelling and any other symptoms.   MEDICAL HISTORY:  Past Medical History:  Diagnosis Date  . Allergy   . Arthritis   . Back pain   . Complication of anesthesia    bp dropped yrs ago, recent surgeries went ok  . Dysrhythmia    occ, no meds for  . GERD (gastroesophageal reflux disease)   . H. pylori infection   . Headache    migraines occ  . Hiatal hernia   . Hx of adenomatous colonic polyps 11/30/2017  . Hyperlipidemia   . Hypothyroidism 1970's   hx of years ago, no meds for   . Shortness of breath dyspnea    with exertion, pt told comes from acid reflux     SURGICAL HISTORY: Past Surgical History:  Procedure Laterality Date  . APPENDECTOMY    . COLONOSCOPY  12/07/2013  . DILATION AND CURETTAGE OF UTERUS    . LAPAROSCOPIC CHOLECYSTECTOMY SINGLE SITE WITH INTRAOPERATIVE CHOLANGIOGRAM N/A 10/28/2015   Procedure: LAPAROSCOPIC CHOLECYSTECTOMY WITH INTRAOPERATIVE CHOLANGIOGRAM;  Surgeon: Armandina Gemma, MD;  Location: [facility];  Service: General;  Laterality: N/A;  .  LAPAROSCOPY  yrs ago   x 2  . TMJ ARTHROSCOPY    . TOTAL ABDOMINAL HYSTERECTOMY     complete     SOCIAL HISTORY: Social History   Socioeconomic History  . Marital status: Married    Spouse name: Not on file  . Number of children: Not on file  . Years of education: Not on file  . Highest education level: Not on file  Occupational History  . Not on file  Tobacco Use  . Smoking status: Never Smoker  . Smokeless tobacco: Never Used  Vaping Use  . Vaping Use: Never used  Substance and Sexual Activity  . Alcohol use: Yes    Alcohol/week: 4.0 - 5.0 standard drinks     Types: 4 - 5 Cans of beer per week    Comment: 4-5 beers weekly-occ per pt  . Drug use: No  . Sexual activity: Not on file  Other Topics Concern  . Not on file  Social History Narrative  . Not on file   Social Determinants of Health   Financial Resource Strain:   . Difficulty of Paying Living Expenses: Not on file  Food Insecurity:   . Worried About Charity fundraiser in the Last Year: Not on file  . Ran Out of Food in the Last Year: Not on file  Transportation Needs:   . Lack of Transportation (Medical): Not on file  . Lack of Transportation (Non-Medical): Not on file  Physical Activity:   . Days of Exercise per Week: Not on file  . Minutes of Exercise per Session: Not on file  Stress:   . Feeling of Stress : Not on file  Social Connections:   . Frequency of Communication with Friends and Family: Not on file  . Frequency of Social Gatherings with Friends and Family: Not on file  . Attends Religious Services: Not on file  . Active Member of Clubs or Organizations: Not on file  . Attends Archivist Meetings: Not on file  . Marital Status: Not on file  Intimate Partner Violence:   . Fear of Current or Ex-Partner: Not on file  . Emotionally Abused: Not on file  . Physically Abused: Not on file  . Sexually Abused: Not on file     FAMILY HISTORY: Family History  Problem Relation Age of Onset  . Colon cancer Father        early 39's  . Ovarian cancer Sister   . Kidney cancer Brother   . Colon polyps Brother   . Colon cancer Paternal Grandfather        unknown age of onset  . Lung cancer Brother   . Colon polyps Sister   . Breast cancer Maternal Aunt   . Breast cancer Cousin   . Rectal cancer Neg Hx   . Stomach cancer Neg Hx   . Esophageal cancer Neg Hx      ALLERGIES:   has No Known Allergies.   MEDICATIONS:  Current Outpatient Medications  Medication Sig Dispense Refill  . acyclovir (ZOVIRAX) 400 MG tablet Take 1 tablet (400 mg total) by mouth 2  (two) times daily. 30 tablet 3  . ALPRAZolam (XANAX) 0.5 MG tablet Take 0.5 mg by mouth at bedtime as needed for anxiety.     . Cholecalciferol (VITAMIN D3) 5000 units CAPS Take 1 capsule by mouth daily.    . cyclobenzaprine (FLEXERIL) 10 MG tablet 10 mg.     . dexamethasone (DECADRON) 4 MG tablet  Take 2 tablets (8 mg total) by mouth daily. Start the day after bendamustine chemotherapy for 2 days. Take with food. 30 tablet 1  . HYDROcodone-acetaminophen (NORCO/VICODIN) 5-325 MG tablet Take 1-2 tablets by mouth every 4 (four) hours as needed for moderate pain. 20 tablet 0  . ibuprofen (ADVIL,MOTRIN) 200 MG tablet Take 400 mg by mouth every 6 (six) hours as needed.    [name] Kitchen LORazepam (ATIVAN) 0.5 MG tablet Take 1 tablet (0.5 mg total) by mouth every 6 (six) hours as needed (Nausea or vomiting). 30 tablet 0  . ondansetron (ZOFRAN) 8 MG tablet Take 1 tablet (8 mg total) by mouth 2 (two) times daily as needed for refractory nausea / vomiting. Start on day 2 after bendamustine chemo. 30 tablet 1  . oxymetazoline (AFRIN) 0.05 % nasal spray Place 1 spray into both nostrils as needed for congestion.    . pantoprazole (PROTONIX) 40 MG tablet Take 1 tablet (40 mg total) by mouth every morning. Before breakfast (Patient taking differently: Take 40 mg by mouth every evening. Before breakfast) 90 tablet 1  . prochlorperazine (COMPAZINE) 10 MG tablet Take 1 tablet (10 mg total) by mouth every 6 (six) hours as needed (Nausea or vomiting). 30 tablet 1  . promethazine (PHENERGAN) 25 MG tablet Take 1 tablet (25 mg total) by mouth every 6 (six) hours as needed for nausea or vomiting. 10 tablet 0  . rosuvastatin (CRESTOR) 5 MG tablet Take 5 mg by mouth daily.     No current facility-administered medications for this visit.     REVIEW OF SYSTEMS:   A 10+ POINT REVIEW OF SYSTEMS WAS OBTAINED including neurology, dermatology, psychiatry, cardiac, respiratory, lymph, extremities, GI, GU, Musculoskeletal, constitutional,  breasts, reproductive, HEENT.  All pertinent positives are noted in the HPI.  All others are negative.   PHYSICAL EXAMINATION: ECOG PERFORMANCE STATUS: 1 - Symptomatic but completely ambulatory  There were no vitals filed for this visit. There were no vitals filed for this visit. There is no height or weight on file to calculate BMI.   Exam was given in a chair   GENERAL:alert, in no acute distress and comfortable SKIN: no acute rashes, no significant lesions EYES: conjunctiva are pink and non-injected, sclera anicteric OROPHARYNX: MMM, no exudates, no oropharyngeal erythema or ulceration NECK: supple, no JVD LYMPH:  no palpable lymphadenopathy in the inguinal region. Palpable lymph nodes in axillary and cervical regions. LUNGS: clear to auscultation b/l with normal respiratory effort HEART: regular rate & rhythm ABDOMEN:  normoactive bowel sounds , non tender, not distended. No palpable hepatosplenomegaly.  Extremity: no pedal edema PSYCH: alert & oriented x 3 with fluent speech NEURO: no focal motor/sensory deficits  LABORATORY DATA:  I have reviewed the data as listed  CBC Latest Ref Rng & Units 10/19/2019 09/20/2019 10/24/2015  WBC 4.0 - 10.5 K/uL 8.6 7.0 8.3  Hemoglobin 12.0 - 15.0 g/dL 12.3 11.8(L) 12.6  Hematocrit 36 - 46 % 36.6 35.4(L) 38.2  Platelets 150 - 400 K/uL 238 263 304    CMP Latest Ref Rng & Units 10/19/2019 09/20/2019 08/22/2015  Glucose 70 - 99 mg/dL 114(H) 82 96  BUN 8 - 23 mg/dL 8 9 11   Creatinine 0.44 - 1.00 mg/dL 0.80 0.83 0.88  Sodium 135 - 145 mmol/L 137 138 136  Potassium 3.5 - 5.1 mmol/L 3.9 4.0 4.8  Chloride 98 - 111 mmol/L 102 105 100  CO2 22 - 32 mmol/L 25 21(L) 27  Calcium 8.9 - 10.3 mg/dL 9.6 9.7  9.7  Total Protein 6.5 - 8.1 g/dL 7.4 7.2 -  Total Bilirubin 0.3 - 1.2 mg/dL 0.6 0.8 -  Alkaline Phos 38 - 126 U/L 71 83 -  AST 15 - 41 U/L 14(L) 13(L) -  ALT 0 - 44 U/L 7 <6 -   09/20/2019 FISH (CLL Prognostic) Panel:   09/05/19 Surgical  Pathology Addendum:   09/05/19 Surgical Pathology     RADIOGRAPHIC STUDIES: I have personally reviewed the radiological images as listed and agreed with the findings in the report. NM PET Image Initial (PI) Skull Base To Thigh  Result Date: 10/02/2019 CLINICAL DATA:  Initial treatment strategy for mantle cell lymphoma. EXAM: NUCLEAR MEDICINE PET SKULL BASE TO THIGH TECHNIQUE: 6.1 mCi F-18 FDG was injected intravenously. Full-ring PET imaging was performed from the skull base to thigh after the radiotracer. CT data was obtained and used for attenuation correction and anatomic localization. Fasting blood glucose: 96 mg/dl COMPARISON:  None. FINDINGS: Mediastinal blood pool activity: SUV max 2.6 Liver activity: SUV max NA NECK: Multiple small bilateral cervical nodes, including a 9 mm short axis left level 2 node (series 4/image 29), max SUV 2.3. Incidental CT findings: none CHEST: Multiple bilateral axillary nodes, including an 11 mm short axis left axillary node (series 4/image 85), max SUV 3.6. Adjacent surgical clip, indicating this likely corresponds to the patient's biopsy-proven mantle cell lymphoma. No hypermetabolic mediastinal or hilar lymphadenopathy. No suspicious pulmonary nodules. Incidental CT findings: Atherosclerotic calcifications of the descending thoracic aorta. Mild coronary atherosclerosis of the LAD. ABDOMEN/PELVIS: Small retroperitoneal/pelvic lymph nodes, including: --8 mm short axis left external iliac node (series 4/image 138), max SUV 3.1 --Bilateral pelvic sidewall nodes measuring up to 8 mm short axis on the left (series 4/image 164), max SUV 4.0 --Bilateral inguinal nodes measuring up to 8 mm short axis on the left (series 4/image 175), max SUV 2.9 Spleen is normal in size, without associated hypermetabolism. No abnormal metabolism in the liver, pancreas, or adrenal glands. Incidental CT findings: Status post cholecystectomy. Atherosclerotic calcifications the abdominal aorta  and branch vessels. Status post hysterectomy. Trace pelvic ascites. SKELETON: No focal hypermetabolic activity to suggest skeletal metastasis. Incidental CT findings: Degenerative changes of the visualized thoracolumbar spine. IMPRESSION: Small cervical, bilateral axillary, retroperitoneal, and bilateral pelvic lymph nodes, corresponding to the patient's known mantle cell lymphoma, as above. Spleen is normal in size. Electronically Signed   By: Julian Hy M.D.   On: 10/02/2019 17:00     ASSESSMENT & PLAN:   Keilly Fatula is a 74 y.o. female with:  1. 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 09/05/2019 Surgical Pathology addendum which revealed "CCN D1 (BCL-1) translocation: DETECTED - The CCND1/IgH t(11;14) translocation is highly specific (70 to 95%) for mantle cell lymphoma (MCL) and can be used for diagnosis, monitoring efficacy of therapy and the detection of minimal residual disease." 09/20/2019 FISH (CLL Prognostic) Panel which revealed "No mutations identified". 10/02/2019 PET/CT ([id number]) which revealed "Small cervical, bilateral axillary, retroperitoneal, and bilateral pelvic lymph nodes, corresponding to the patient's known mantle cell lymphoma, as above. Spleen is normal in size." PLAN: -Discussed pt labwork today, 10/19/19; blood counts and chemistries are nml -The pt has no prohibitive toxicities from proceding with C1 Bendamustine/Rituxan at this time. -Plan to treat with 4-6 cycles of chemotherapy and repeat PET/CT after three cycles.  -Advised pt that it's okay to drink carrot juice, but avoid grapefruit juice. -Advised pt that it's okay to continue to use her current  hair dye, provided no reactions.  -Will see back for a toxicity check   FOLLOW UP RTC in 2 weeks with labs for toxicity check with Dr Irene Limbo Plz schedule C2 of BR (D1 and D2) , labs and MD visit on C2D1  The total time spent in the appt was 20  minutes and more than 50% was on counseling and direct patient cares.  All of the patient's questions were answered with apparent satisfaction. The patient knows to call the clinic with any problems, questions or concerns.    Sullivan Lone MD [place] AAHIVMS [facility] Hematology/Oncology Physician [facility]  (Office):       [phone] (Work cell):  [phone] (Fax):           [phone]  10/19/2019 3:34 PM  I, Yevette Edwards, am acting as a scribe for Dr. Sullivan Lone.   .I have reviewed the above documentation for accuracy and completeness, and I agree with the above. Brunetta Genera MD

## 2019-10-19 ENCOUNTER — Inpatient Hospital Stay: Payer: Medicare HMO

## 2019-10-19 ENCOUNTER — Inpatient Hospital Stay: Payer: Medicare HMO | Admitting: Hematology

## 2019-10-19 ENCOUNTER — Other Ambulatory Visit: Payer: Self-pay

## 2019-10-19 VITALS — BP 133/76 | HR 79 | Temp 98.6°F | Resp 20 | Wt 121.5 lb

## 2019-10-19 DIAGNOSIS — C8318 Mantle cell lymphoma, lymph nodes of multiple sites: Secondary | ICD-10-CM | POA: Diagnosis present

## 2019-10-19 DIAGNOSIS — Z5111 Encounter for antineoplastic chemotherapy: Secondary | ICD-10-CM | POA: Diagnosis not present

## 2019-10-19 DIAGNOSIS — C8594 Non-Hodgkin lymphoma, unspecified, lymph nodes of axilla and upper limb: Secondary | ICD-10-CM

## 2019-10-19 DIAGNOSIS — Z5112 Encounter for antineoplastic immunotherapy: Secondary | ICD-10-CM | POA: Diagnosis not present

## 2019-10-19 DIAGNOSIS — Z7189 Other specified counseling: Secondary | ICD-10-CM

## 2019-10-19 LAB — CBC WITH DIFFERENTIAL (CANCER CENTER ONLY)
Abs Immature Granulocytes: 0.03 10*3/uL (ref 0.00–0.07)
Basophils Absolute: 0 10*3/uL (ref 0.0–0.1)
Basophils Relative: 1 %
Eosinophils Absolute: 0.1 10*3/uL (ref 0.0–0.5)
Eosinophils Relative: 1 %
HCT: 36.6 % (ref 36.0–46.0)
Hemoglobin: 12.3 g/dL (ref 12.0–15.0)
Immature Granulocytes: 0 %
Lymphocytes Relative: 19 %
Lymphs Abs: 1.7 10*3/uL (ref 0.7–4.0)
MCH: 31.3 pg (ref 26.0–34.0)
MCHC: 33.6 g/dL (ref 30.0–36.0)
MCV: 93.1 fL (ref 80.0–100.0)
Monocytes Absolute: 0.7 10*3/uL (ref 0.1–1.0)
Monocytes Relative: 8 %
Neutro Abs: 6.1 10*3/uL (ref 1.7–7.7)
Neutrophils Relative %: 71 %
Platelet Count: 238 10*3/uL (ref 150–400)
RBC: 3.93 MIL/uL (ref 3.87–5.11)
RDW: 12.5 % (ref 11.5–15.5)
WBC Count: 8.6 10*3/uL (ref 4.0–10.5)
nRBC: 0 % (ref 0.0–0.2)

## 2019-10-19 LAB — CMP (CANCER CENTER ONLY)
ALT: 7 U/L (ref 0–44)
AST: 14 U/L — ABNORMAL LOW (ref 15–41)
Albumin: 4.3 g/dL (ref 3.5–5.0)
Alkaline Phosphatase: 71 U/L (ref 38–126)
Anion gap: 10 (ref 5–15)
BUN: 8 mg/dL (ref 8–23)
CO2: 25 mmol/L (ref 22–32)
Calcium: 9.6 mg/dL (ref 8.9–10.3)
Chloride: 102 mmol/L (ref 98–111)
Creatinine: 0.8 mg/dL (ref 0.44–1.00)
GFR, Est AFR Am: >60 mL/min (ref >60)
GFR, Estimated: >60 mL/min (ref >60)
Glucose, Bld: 114 mg/dL — ABNORMAL HIGH (ref 70–99)
Potassium: 3.9 mmol/L (ref 3.5–5.1)
Sodium: 137 mmol/L (ref 135–145)
Total Bilirubin: 0.6 mg/dL (ref 0.3–1.2)
Total Protein: 7.4 g/dL (ref 6.5–8.1)

## 2019-10-19 MED ORDER — DIPHENHYDRAMINE HCL 25 MG PO CAPS
ORAL_CAPSULE | ORAL | Status: AC
  Filled 2019-10-19: qty 2

## 2019-10-19 MED ORDER — DIPHENHYDRAMINE HCL 25 MG PO CAPS
50.0000 mg | ORAL_CAPSULE | Freq: Once | ORAL | Status: AC
  Administered 2019-10-19: 50 mg via ORAL

## 2019-10-19 MED ORDER — PALONOSETRON HCL INJECTION 0.25 MG/5ML
INTRAVENOUS | Status: AC
  Filled 2019-10-19: qty 5

## 2019-10-19 MED ORDER — SODIUM CHLORIDE 0.9 % IV SOLN
90.0000 mg/m2 | Freq: Once | INTRAVENOUS | Status: AC
  Administered 2019-10-19: 150 mg via INTRAVENOUS
  Filled 2019-10-19: qty 6

## 2019-10-19 MED ORDER — FAMOTIDINE IN NACL 20-0.9 MG/50ML-% IV SOLN
INTRAVENOUS | Status: AC
  Filled 2019-10-19: qty 50

## 2019-10-19 MED ORDER — SODIUM CHLORIDE 0.9 % IV SOLN
Freq: Once | INTRAVENOUS | Status: AC
  Filled 2019-10-19: qty 250

## 2019-10-19 MED ORDER — ACETAMINOPHEN 325 MG PO TABS
650.0000 mg | ORAL_TABLET | Freq: Once | ORAL | Status: AC
  Administered 2019-10-19: 650 mg via ORAL

## 2019-10-19 MED ORDER — SODIUM CHLORIDE 0.9 % IV SOLN
375.0000 mg/m2 | Freq: Once | INTRAVENOUS | Status: AC
  Administered 2019-10-19: 600 mg via INTRAVENOUS
  Filled 2019-10-19: qty 50

## 2019-10-19 MED ORDER — SODIUM CHLORIDE 0.9 % IV SOLN
10.0000 mg | Freq: Once | INTRAVENOUS | Status: AC
  Administered 2019-10-19: 10 mg via INTRAVENOUS
  Filled 2019-10-19: qty 10

## 2019-10-19 MED ORDER — FAMOTIDINE IN NACL 20-0.9 MG/50ML-% IV SOLN
20.0000 mg | Freq: Once | INTRAVENOUS | Status: AC
  Administered 2019-10-19: 20 mg via INTRAVENOUS

## 2019-10-19 MED ORDER — ACETAMINOPHEN 325 MG PO TABS
ORAL_TABLET | ORAL | Status: AC
  Filled 2019-10-19: qty 2

## 2019-10-19 MED ORDER — PALONOSETRON HCL INJECTION 0.25 MG/5ML
0.2500 mg | Freq: Once | INTRAVENOUS | Status: AC
  Administered 2019-10-19: 0.25 mg via INTRAVENOUS

## 2019-10-19 NOTE — Patient Instructions (Addendum)
[facility] Discharge Instructions for Patients Receiving Chemotherapy  Today you received the following chemotherapy agent: Rituximab and Bendamustine  To help prevent nausea and vomiting after your treatment, we encourage you to take your nausea medication as directed by your MD.   If you develop nausea and vomiting that is not controlled by your nausea medication, call the clinic.   BELOW ARE SYMPTOMS THAT SHOULD BE REPORTED IMMEDIATELY:  *FEVER GREATER THAN 100.5 F  *CHILLS WITH OR WITHOUT FEVER  NAUSEA AND VOMITING THAT IS NOT CONTROLLED WITH YOUR NAUSEA MEDICATION  *UNUSUAL SHORTNESS OF BREATH  *UNUSUAL BRUISING OR BLEEDING  TENDERNESS IN MOUTH AND THROAT WITH OR WITHOUT PRESENCE OF ULCERS  *URINARY PROBLEMS  *BOWEL PROBLEMS  UNUSUAL RASH Items with * indicate a potential emergency and should be followed up as soon as possible.  Feel free to call the clinic should you have any questions or concerns. The clinic phone number is [phone].  Please show the [facility] at check-in to the Emergency Department and triage nurse.  Bendamustine Injection What is this medicine? BENDAMUSTINE (BEN da MUS teen) is a chemotherapy drug. It is used to treat chronic lymphocytic leukemia and non-Hodgkin lymphoma. This medicine may be used for other purposes; ask your health care provider or pharmacist if you have questions. COMMON BRAND NAME(S): Kristine Royal, Treanda What should I tell my health care provider before I take this medicine? They need to know if you have any of these conditions:  infection (especially a virus infection such as chickenpox, cold sores, or herpes)  kidney disease  liver disease  an unusual or allergic reaction to bendamustine, mannitol, other medicines, foods, dyes, or preservatives  pregnant or trying to get pregnant  breast-feeding How should I use this medicine? This medicine is for infusion into a vein. It is given  by a health care professional in a hospital or clinic setting. Talk to your pediatrician regarding the use of this medicine in children. Special care may be needed. Overdosage: If you think you have taken too much of this medicine contact a poison control center or emergency room at once. NOTE: This medicine is only for you. Do not share this medicine with others. What if I miss a dose? It is important not to miss your dose. Call your doctor or health care professional if you are unable to keep an appointment. What may interact with this medicine? Do not take this medicine with any of the following medications:  clozapine This medicine may also interact with the following medications:  atazanavir  cimetidine  ciprofloxacin  enoxacin  fluvoxamine  medicines for seizures like carbamazepine and phenobarbital  mexiletine  rifampin  tacrine  thiabendazole  zileuton This list may not describe all possible interactions. Give your health care provider a list of all the medicines, herbs, non-prescription drugs, or dietary supplements you use. Also tell them if you smoke, drink alcohol, or use illegal drugs. Some items may interact with your medicine. What should I watch for while using this medicine? This drug may make you feel generally unwell. This is not uncommon, as chemotherapy can affect healthy cells as well as cancer cells. Report any side effects. Continue your course of treatment even though you feel ill unless your doctor tells you to stop. You may need blood work done while you are taking this medicine. Call your doctor or healthcare provider for advice if you get a fever, chills or sore throat, or other symptoms of a  cold or flu. Do not treat yourself. This drug decreases your body's ability to fight infections. Try to avoid being around people who are sick. This medicine may cause serious skin reactions. They can happen weeks to months after starting the medicine. Contact  your healthcare provider right away if you notice fevers or flu-like symptoms with a rash. The rash may be red or purple and then turn into blisters or peeling of the skin. Or, you might notice a red rash with swelling of the face, lips or lymph nodes in your neck or under your arms. This medicine may increase your risk to bruise or bleed. Call your doctor or healthcare provider if you notice any unusual bleeding. Talk to your doctor about your risk of cancer. You may be more at risk for certain types of cancers if you take this medicine. Do not become pregnant while taking this medicine or for at least 6 months after stopping it. Women should inform their doctor if they wish to become pregnant or think they might be pregnant. Men should not father a child while taking this medicine and for at least 3 months after stopping it. There is a potential for serious side effects to an unborn child. Talk to your healthcare provider or pharmacist for more information. Do not breast-feed an infant while taking this medicine or for at least 1 week after stopping it. This medicine may make it more difficult to father a child. You should talk with your doctor or healthcare provider if you are concerned about your fertility. What side effects may I notice from receiving this medicine? Side effects that you should report to your doctor or health care professional as soon as possible:  allergic reactions like skin rash, itching or hives, swelling of the face, lips, or tongue  low blood counts - this medicine may decrease the number of white blood cells, red blood cells and platelets. You may be at increased risk for infections and bleeding.  rash, fever, and swollen lymph nodes  redness, blistering, peeling, or loosening of the skin, including inside the mouth  signs of infection like fever or chills, cough, sore throat, pain or difficulty passing urine  signs of decreased platelets or bleeding like bruising,  pinpoint red spots on the skin, black, tarry stools, blood in the urine  signs of decreased red blood cells like being unusually weak or tired, fainting spells, lightheadedness  signs and symptoms of kidney injury like trouble passing urine or change in the amount of urine  signs and symptoms of liver injury like dark yellow or brown urine; general ill feeling or flu-like symptoms; light-colored stools; loss of appetite; nausea; right upper belly pain; unusually weak or tired; yellowing of the eyes or skin Side effects that usually do not require medical attention (report to your doctor or health care professional if they continue or are bothersome):  constipation  decreased appetite  diarrhea  headache  mouth sores  nausea, vomiting  tiredness This list may not describe all possible side effects. Call your doctor for medical advice about side effects. You may report side effects to FDA at 1-800-FDA-1088. Where should I keep my medicine? This drug is given in a hospital or clinic and will not be stored at home. NOTE: This sheet is a summary. It may not cover all possible information. If you have questions about this medicine, talk to your doctor, pharmacist, or health care provider.  2020 Elsevier/Gold Standard (2018-04-18 10:26:46)  Rituximab injection What is  this medicine? RITUXIMAB (ri TUX i mab) is a monoclonal antibody. It is used to treat certain types of cancer like non-Hodgkin lymphoma and chronic lymphocytic leukemia. It is also used to treat rheumatoid arthritis, granulomatosis with polyangiitis (or Wegener's granulomatosis), microscopic polyangiitis, and pemphigus vulgaris. This medicine may be used for other purposes; ask your health care provider or pharmacist if you have questions. COMMON BRAND NAME(S): Rituxan, RUXIENCE What should I tell my health care provider before I take this medicine? They need to know if you have any of these conditions:  heart  disease  infection (especially a virus infection such as hepatitis B, chickenpox, cold sores, or herpes)  immune system problems  irregular heartbeat  kidney disease  low blood counts, like low white cell, platelet, or red cell counts  lung or breathing disease, like asthma  recently received or scheduled to receive a vaccine  an unusual or allergic reaction to rituximab, other medicines, foods, dyes, or preservatives  pregnant or trying to get pregnant  breast-feeding How should I use this medicine? This medicine is for infusion into a vein. It is administered in a hospital or clinic by a specially trained health care professional. A special MedGuide will be given to you by the pharmacist with each prescription and refill. Be sure to read this information carefully each time. Talk to your pediatrician regarding the use of this medicine in children. This medicine is not approved for use in children. Overdosage: If you think you have taken too much of this medicine contact a poison control center or emergency room at once. NOTE: This medicine is only for you. Do not share this medicine with others. What if I miss a dose? It is important not to miss a dose. Call your doctor or health care professional if you are unable to keep an appointment. What may interact with this medicine?  cisplatin  live virus vaccines This list may not describe all possible interactions. Give your health care provider a list of all the medicines, herbs, non-prescription drugs, or dietary supplements you use. Also tell them if you smoke, drink alcohol, or use illegal drugs. Some items may interact with your medicine. What should I watch for while using this medicine? Your condition will be monitored carefully while you are receiving this medicine. You may need blood work done while you are taking this medicine. This medicine can cause serious allergic reactions. To reduce your risk you may need to take  medicine before treatment with this medicine. Take your medicine as directed. In some patients, this medicine may cause a serious brain infection that may cause death. If you have any problems seeing, thinking, speaking, walking, or standing, tell your healthcare professional right away. If you cannot reach your healthcare professional, urgently seek other source of medical care. Call your doctor or health care professional for advice if you get a fever, chills or sore throat, or other symptoms of a cold or flu. Do not treat yourself. This drug decreases your body's ability to fight infections. Try to avoid being around people who are sick. Do not become pregnant while taking this medicine or for at least 12 months after stopping it. Women should inform their doctor if they wish to become pregnant or think they might be pregnant. There is a potential for serious side effects to an unborn child. Talk to your health care professional or pharmacist for more information. Do not breast-feed an infant while taking this medicine or for at least 6 months  after stopping it. What side effects may I notice from receiving this medicine? Side effects that you should report to your doctor or health care professional as soon as possible:  allergic reactions like skin rash, itching or hives; swelling of the face, lips, or tongue  breathing problems  chest pain  changes in vision  diarrhea  headache with fever, neck stiffness, sensitivity to light, nausea, or confusion  fast, irregular heartbeat  loss of memory  low blood counts - this medicine may decrease the number of white blood cells, red blood cells and platelets. You may be at increased risk for infections and bleeding.  mouth sores  problems with balance, talking, or walking  redness, blistering, peeling or loosening of the skin, including inside the mouth  signs of infection - fever or chills, cough, sore throat, pain or difficulty passing  urine  signs and symptoms of kidney injury like trouble passing urine or change in the amount of urine  signs and symptoms of liver injury like dark yellow or brown urine; general ill feeling or flu-like symptoms; light-colored stools; loss of appetite; nausea; right upper belly pain; unusually weak or tired; yellowing of the eyes or skin  signs and symptoms of low blood pressure like dizziness; feeling faint or lightheaded, falls; unusually weak or tired  stomach pain  swelling of the ankles, feet, hands  unusual bleeding or bruising  vomiting Side effects that usually do not require medical attention (report to your doctor or health care professional if they continue or are bothersome):  headache  joint pain  muscle cramps or muscle pain  nausea  tiredness This list may not describe all possible side effects. Call your doctor for medical advice about side effects. You may report side effects to FDA at 1-800-FDA-1088. Where should I keep my medicine? This drug is given in a hospital or clinic and will not be stored at home. NOTE: This sheet is a summary. It may not cover all possible information. If you have questions about this medicine, talk to your doctor, pharmacist, or health care provider.  2020 Elsevier/Gold Standard (2018-03-08 22:01:36)

## 2019-10-20 ENCOUNTER — Inpatient Hospital Stay: Payer: Medicare HMO

## 2019-10-20 VITALS — BP 132/71 | HR 73 | Temp 98.1°F | Resp 18

## 2019-10-20 DIAGNOSIS — C8318 Mantle cell lymphoma, lymph nodes of multiple sites: Secondary | ICD-10-CM

## 2019-10-20 DIAGNOSIS — Z7189 Other specified counseling: Secondary | ICD-10-CM

## 2019-10-20 DIAGNOSIS — Z5112 Encounter for antineoplastic immunotherapy: Secondary | ICD-10-CM | POA: Diagnosis not present

## 2019-10-20 MED ORDER — SODIUM CHLORIDE 0.9 % IV SOLN
90.0000 mg/m2 | Freq: Once | INTRAVENOUS | Status: AC
  Administered 2019-10-20: 150 mg via INTRAVENOUS
  Filled 2019-10-20: qty 6

## 2019-10-20 MED ORDER — SODIUM CHLORIDE 0.9 % IV SOLN
Freq: Once | INTRAVENOUS | Status: AC
  Filled 2019-10-20: qty 250

## 2019-10-20 MED ORDER — SODIUM CHLORIDE 0.9 % IV SOLN
10.0000 mg | Freq: Once | INTRAVENOUS | Status: AC
  Administered 2019-10-20: 10 mg via INTRAVENOUS
  Filled 2019-10-20: qty 10

## 2019-10-20 NOTE — Patient Instructions (Signed)
[facility] Discharge Instructions for Patients Receiving Chemotherapy  Today you received the following chemotherapy agents Bendeka.   To help prevent nausea and vomiting after your treatment, we encourage you to take your nausea medication as prescribed.    If you develop nausea and vomiting that is not controlled by your nausea medication, call the clinic.   BELOW ARE SYMPTOMS THAT SHOULD BE REPORTED IMMEDIATELY:  *FEVER GREATER THAN 100.5 F  *CHILLS WITH OR WITHOUT FEVER  NAUSEA AND VOMITING THAT IS NOT CONTROLLED WITH YOUR NAUSEA MEDICATION  *UNUSUAL SHORTNESS OF BREATH  *UNUSUAL BRUISING OR BLEEDING  TENDERNESS IN MOUTH AND THROAT WITH OR WITHOUT PRESENCE OF ULCERS  *URINARY PROBLEMS  *BOWEL PROBLEMS  UNUSUAL RASH Items with * indicate a potential emergency and should be followed up as soon as possible.  Feel free to call the clinic should you have any questions or concerns. The clinic phone number is [phone].  Please show the CHEMO ALERT CARD at check-in to the Emergency Department and triage nurse.  

## 2019-10-22 ENCOUNTER — Telehealth: Payer: Self-pay | Admitting: *Deleted

## 2019-10-22 ENCOUNTER — Encounter: Payer: Self-pay | Admitting: Hematology

## 2019-10-22 NOTE — Progress Notes (Signed)
Pt is approved w/ [employer] to receive assistance up to $5,000 tohelp pay for out-of-pocket expenses related to cancer treatment effective9/1/21to8/31/22.

## 2019-10-24 ENCOUNTER — Telehealth: Payer: Self-pay | Admitting: *Deleted

## 2019-10-24 ENCOUNTER — Other Ambulatory Visit: Payer: Self-pay | Admitting: Hematology

## 2019-10-24 ENCOUNTER — Encounter: Payer: Self-pay | Admitting: Hematology

## 2019-10-24 DIAGNOSIS — C8318 Mantle cell lymphoma, lymph nodes of multiple sites: Secondary | ICD-10-CM

## 2019-10-24 DIAGNOSIS — Z7189 Other specified counseling: Secondary | ICD-10-CM

## 2019-10-24 NOTE — Telephone Encounter (Signed)
Patient's daughter called. Mother drinking 64 oz fluid daily as advised. Spent last night in hospital with husband who is inpatient and is very fatigued today. Mother took lorazepam for nausea earlier -did not try compazine or Zofran. Still nauseated. Encouraged her to take zofran for nausea at next dose and to try clear liquids/souprs/juices, toast/crackers until nausea subsides. Patient experiencing constipation - no BM since 2 days ago. Asked if her mother can take Senna-S as she has some at home. Advised that her mother can take them as directed on package. She also asked if mother can start taking Miralax daily - advised that she can. Daughter verbalized understanding.

## 2019-11-01 ENCOUNTER — Inpatient Hospital Stay: Payer: Medicare HMO | Admitting: Hematology

## 2019-11-01 ENCOUNTER — Other Ambulatory Visit: Payer: Self-pay

## 2019-11-01 ENCOUNTER — Inpatient Hospital Stay: Payer: Medicare HMO

## 2019-11-01 VITALS — BP 150/92 | HR 84 | Temp 98.6°F | Resp 18 | Ht 63.0 in | Wt 121.0 lb

## 2019-11-01 DIAGNOSIS — Z5112 Encounter for antineoplastic immunotherapy: Secondary | ICD-10-CM | POA: Diagnosis not present

## 2019-11-01 DIAGNOSIS — C8318 Mantle cell lymphoma, lymph nodes of multiple sites: Secondary | ICD-10-CM

## 2019-11-01 LAB — CMP (CANCER CENTER ONLY)
ALT: 11 U/L (ref 0–44)
AST: 17 U/L (ref 15–41)
Albumin: 3.8 g/dL (ref 3.5–5.0)
Alkaline Phosphatase: 75 U/L (ref 38–126)
Anion gap: 6 (ref 5–15)
BUN: 6 mg/dL — ABNORMAL LOW (ref 8–23)
CO2: 29 mmol/L (ref 22–32)
Calcium: 9.9 mg/dL (ref 8.9–10.3)
Chloride: 98 mmol/L (ref 98–111)
Creatinine: 0.75 mg/dL (ref 0.44–1.00)
GFR, Est AFR Am: >60 mL/min (ref >60)
GFR, Estimated: >60 mL/min (ref >60)
Glucose, Bld: 93 mg/dL (ref 70–99)
Potassium: 3.8 mmol/L (ref 3.5–5.1)
Sodium: 133 mmol/L — ABNORMAL LOW (ref 135–145)
Total Bilirubin: 0.6 mg/dL (ref 0.3–1.2)
Total Protein: 7 g/dL (ref 6.5–8.1)

## 2019-11-01 LAB — CBC WITH DIFFERENTIAL/PLATELET
Abs Immature Granulocytes: 0.04 10*3/uL (ref 0.00–0.07)
Basophils Absolute: 0.1 10*3/uL (ref 0.0–0.1)
Basophils Relative: 1 %
Eosinophils Absolute: 0.1 10*3/uL (ref 0.0–0.5)
Eosinophils Relative: 1 %
HCT: 35.5 % — ABNORMAL LOW (ref 36.0–46.0)
Hemoglobin: 11.8 g/dL — ABNORMAL LOW (ref 12.0–15.0)
Immature Granulocytes: 1 %
Lymphocytes Relative: 10 %
Lymphs Abs: 0.7 10*3/uL (ref 0.7–4.0)
MCH: 30.9 pg (ref 26.0–34.0)
MCHC: 33.2 g/dL (ref 30.0–36.0)
MCV: 92.9 fL (ref 80.0–100.0)
Monocytes Absolute: 1.2 10*3/uL — ABNORMAL HIGH (ref 0.1–1.0)
Monocytes Relative: 16 %
Neutro Abs: 5.3 10*3/uL (ref 1.7–7.7)
Neutrophils Relative %: 71 %
Platelets: 241 10*3/uL (ref 150–400)
RBC: 3.82 MIL/uL — ABNORMAL LOW (ref 3.87–5.11)
RDW: 13.2 % (ref 11.5–15.5)
WBC: 7.4 10*3/uL (ref 4.0–10.5)
nRBC: 0 % (ref 0.0–0.2)

## 2019-11-01 LAB — ABO/RH: ABO/RH(D): B POS

## 2019-11-01 NOTE — Progress Notes (Signed)
HEMATOLOGY/ONCOLOGY CLINIC NOTE  Date of Service: 11/01/2019  Patient Care Team: Tamsen Roers, MD as PCP - General (Family Medicine)  REFERRING PHYSICIAN: Tamsen Roers, MD  CHIEF COMPLAINTS/PURPOSE OF CONSULTATION:  Non-Hodgkin's Lymphoma  HISTORY OF PRESENTING ILLNESS:  Brooke [name] is a wonderful 74 y.o. female who has been referred to [place] by Tamsen Roers, MD for evaluation and management of Non-Hodgkin's Lymphoma. Pt is accompanied today by her sister. The pt reports that she is doing well overall.   The pt reports she is good. At one time she had a not under her armpit in the left side. They kept doing [place] for about a year and finally did a biopsy. The not to her disappeared and she did not feel it anymore. Pt had a US of the breast and tried to put it off due to having her COVID19 vaccine but they still had her coming in.   Over the last year pt has not had fever, chills, and  unexpected weight change. She has had night sweats but not drenching for several years now. Pt has lost weight due to stress over the last 2 months.   Of note prior to the patient's visit today, pt has had Surgical Pathology completed on 09/05/19 with results revealing "Lymph node, needle/core biopsy, left axilla CD5-POSITIVE NON-HODGKIN B-CELL LYMPHOMA, SEE COMMENT"  On review of systems, pt denies fevers, chills, unexpected weight changes, night sweats, new lumps/bumps, back pain, abdominal pain, skin rashes and any other symptoms.   On Social Hx the pt reports never smoked, social alcohol use  On Family Hx the pt reports many people in her family have had cancer, her second cousin had breast cancer in her early 58's.   INTERVAL HISTORY: Brooke [name] is a wonderful 74 y.o. female who is here for evaluation and management of Non-Hodgkin's Lymphoma. She is here for a toxicity check after C1 Bendamustine + Rituxan. The patient's last visit with [place] was on 10/19/2019. The pt  reports that she is doing well overall.  The pt reports that she is experiencing more stress due to her husband's health. Pt had increased fatigue and nausea the first week after treatment. The fatigue did not stop her from completing her daily tasks and has since resolved. Pt noticed that she was developing nausea after taking Acyclovir. She began taking an antiemetic with Acyclovir and the nausea disappeared. She has not required any antiemetics in four days.   Lab results today (11/01/19) of CBC w/diff and CMP is as follows: all values are WNL except for RBC at 3.82, Hgb at 11.8, HCT at 35.5, Mono Abs at 1.2K, Sodium at 133, BUN at 6.  On review of systems, pt reports stress and denies rash, fatigue, nausea, mouth sores, abdominal pain and any other symptoms.   MEDICAL HISTORY:  Past Medical History:  Diagnosis Date  . Allergy   . Arthritis   . Back pain   . Complication of anesthesia    bp dropped yrs ago, recent surgeries went ok  . Dysrhythmia    occ, no meds for  . GERD (gastroesophageal reflux disease)   . H. pylori infection   . Headache    migraines occ  . Hiatal hernia   . Hx of adenomatous colonic polyps 11/30/2017  . Hyperlipidemia   . Hypothyroidism 1970's   hx of years ago, no meds for   . Shortness of breath dyspnea    with exertion, pt told comes from acid reflux  SURGICAL HISTORY: Past Surgical History:  Procedure Laterality Date  . APPENDECTOMY    . COLONOSCOPY  12/07/2013  . DILATION AND CURETTAGE OF UTERUS    . LAPAROSCOPIC CHOLECYSTECTOMY SINGLE SITE WITH INTRAOPERATIVE CHOLANGIOGRAM N/A 10/28/2015   Procedure: LAPAROSCOPIC CHOLECYSTECTOMY WITH INTRAOPERATIVE CHOLANGIOGRAM;  Surgeon: Armandina Gemma, MD;  Location: [facility];  Service: General;  Laterality: N/A;  . LAPAROSCOPY  yrs ago   x 2  . TMJ ARTHROSCOPY    . TOTAL ABDOMINAL HYSTERECTOMY     complete     SOCIAL HISTORY: Social History   Socioeconomic History  . Marital status: Married     Spouse name: Not on file  . Number of children: Not on file  . Years of education: Not on file  . Highest education level: Not on file  Occupational History  . Not on file  Tobacco Use  . Smoking status: Never Smoker  . Smokeless tobacco: Never Used  Vaping Use  . Vaping Use: Never used  Substance and Sexual Activity  . Alcohol use: Yes    Alcohol/week: 4.0 - 5.0 standard drinks    Types: 4 - 5 Cans of beer per week    Comment: 4-5 beers weekly-occ per pt  . Drug use: No  . Sexual activity: Not on file  Other Topics Concern  . Not on file  Social History Narrative  . Not on file   Social Determinants of Health   Financial Resource Strain:   . Difficulty of Paying Living Expenses: Not on file  Food Insecurity:   . Worried About Charity fundraiser in the Last Year: Not on file  . Ran Out of Food in the Last Year: Not on file  Transportation Needs:   . Lack of Transportation (Medical): Not on file  . Lack of Transportation (Non-Medical): Not on file  Physical Activity:   . Days of Exercise per Week: Not on file  . Minutes of Exercise per Session: Not on file  Stress:   . Feeling of Stress : Not on file  Social Connections:   . Frequency of Communication with Friends and Family: Not on file  . Frequency of Social Gatherings with Friends and Family: Not on file  . Attends Religious Services: Not on file  . Active Member of Clubs or Organizations: Not on file  . Attends Archivist Meetings: Not on file  . Marital Status: Not on file  Intimate Partner Violence:   . Fear of Current or Ex-Partner: Not on file  . Emotionally Abused: Not on file  . Physically Abused: Not on file  . Sexually Abused: Not on file     FAMILY HISTORY: Family History  Problem Relation Age of Onset  . Colon cancer Father        early 42's  . Ovarian cancer Sister   . Kidney cancer Brother   . Colon polyps Brother   . Colon cancer Paternal Grandfather        unknown age of onset    . Lung cancer Brother   . Colon polyps Sister   . Breast cancer Maternal Aunt   . Breast cancer Cousin   . Rectal cancer Neg Hx   . Stomach cancer Neg Hx   . Esophageal cancer Neg Hx      ALLERGIES:   has No Known Allergies.   MEDICATIONS:  Current Outpatient Medications  Medication Sig Dispense Refill  . acyclovir (ZOVIRAX) 400 MG tablet Take 1 tablet (400 mg total)  by mouth 2 (two) times daily. 30 tablet 3  . ALPRAZolam (XANAX) 0.5 MG tablet Take 0.5 mg by mouth at bedtime as needed for anxiety.     . Cholecalciferol (VITAMIN D3) 5000 units CAPS Take 1 capsule by mouth daily.    . cyclobenzaprine (FLEXERIL) 10 MG tablet 10 mg.     . dexamethasone (DECADRON) 4 MG tablet Take 2 tablets (8 mg total) by mouth daily. Start the day after bendamustine chemotherapy for 2 days. Take with food. 30 tablet 1  . HYDROcodone-acetaminophen (NORCO/VICODIN) 5-325 MG tablet Take 1-2 tablets by mouth every 4 (four) hours as needed for moderate pain. 20 tablet 0  . ibuprofen (ADVIL,MOTRIN) 200 MG tablet Take 400 mg by mouth every 6 (six) hours as needed.    [name] Kitchen LORazepam (ATIVAN) 0.5 MG tablet Take 1 tablet (0.5 mg total) by mouth every 6 (six) hours as needed (Nausea or vomiting). 30 tablet 0  . ondansetron (ZOFRAN) 8 MG tablet Take 1 tablet (8 mg total) by mouth 2 (two) times daily as needed for refractory nausea / vomiting. Start on day 2 after bendamustine chemo. 30 tablet 1  . oxymetazoline (AFRIN) 0.05 % nasal spray Place 1 spray into both nostrils as needed for congestion.    . pantoprazole (PROTONIX) 40 MG tablet Take 1 tablet (40 mg total) by mouth every morning. Before breakfast (Patient taking differently: Take 40 mg by mouth every evening. Before breakfast) 90 tablet 1  . prochlorperazine (COMPAZINE) 10 MG tablet Take 1 tablet (10 mg total) by mouth every 6 (six) hours as needed (Nausea or vomiting). 30 tablet 1  . promethazine (PHENERGAN) 25 MG tablet Take 1 tablet (25 mg total) by mouth  every 6 (six) hours as needed for nausea or vomiting. 10 tablet 0  . rosuvastatin (CRESTOR) 5 MG tablet Take 5 mg by mouth daily.     No current facility-administered medications for this visit.     REVIEW OF SYSTEMS:   A 10+ POINT REVIEW OF SYSTEMS WAS OBTAINED including neurology, dermatology, psychiatry, cardiac, respiratory, lymph, extremities, GI, GU, Musculoskeletal, constitutional, breasts, reproductive, HEENT.  All pertinent positives are noted in the HPI.  All others are negative.   PHYSICAL EXAMINATION: ECOG PERFORMANCE STATUS: 1 - Symptomatic but completely ambulatory  There were no vitals filed for this visit. There were no vitals filed for this visit. There is no height or weight on file to calculate BMI.   GENERAL:alert, in no acute distress and comfortable SKIN: no acute rashes, no significant lesions EYES: conjunctiva are pink and non-injected, sclera anicteric OROPHARYNX: MMM, no exudates, no oropharyngeal erythema or ulceration NECK: supple, no JVD LYMPH:  no palpable lymphadenopathy in the cervical, axillary or inguinal regions  LUNGS: clear to auscultation b/l with normal respiratory effort HEART: regular rate & rhythm ABDOMEN:  normoactive bowel sounds , non tender, not distended. No palpable hepatosplenomegaly.  Extremity: no pedal edema PSYCH: alert & oriented x 3 with fluent speech NEURO: no focal motor/sensory deficits  LABORATORY DATA:  I have reviewed the data as listed  CBC Latest Ref Rng & Units 10/19/2019 09/20/2019 10/24/2015  WBC 4.0 - 10.5 K/uL 8.6 7.0 8.3  Hemoglobin 12.0 - 15.0 g/dL 12.3 11.8(L) 12.6  Hematocrit 36 - 46 % 36.6 35.4(L) 38.2  Platelets 150 - 400 K/uL 238 263 304    CMP Latest Ref Rng & Units 10/19/2019 09/20/2019 08/22/2015  Glucose 70 - 99 mg/dL 114(H) 82 96  BUN 8 - 23 mg/dL 8 9 11  Creatinine 0.44 - 1.00 mg/dL 0.80 0.83 0.88  Sodium 135 - 145 mmol/L 137 138 136  Potassium 3.5 - 5.1 mmol/L 3.9 4.0 4.8  Chloride 98 - 111  mmol/L 102 105 100  CO2 22 - 32 mmol/L 25 21(L) 27  Calcium 8.9 - 10.3 mg/dL 9.6 9.7 9.7  Total Protein 6.5 - 8.1 g/dL 7.4 7.2 -  Total Bilirubin 0.3 - 1.2 mg/dL 0.6 0.8 -  Alkaline Phos 38 - 126 U/L 71 83 -  AST 15 - 41 U/L 14(L) 13(L) -  ALT 0 - 44 U/L 7 <6 -   09/20/2019 FISH (CLL Prognostic) Panel:   09/05/19 Surgical Pathology Addendum:   09/05/19 Surgical Pathology     RADIOGRAPHIC STUDIES: I have personally reviewed the radiological images as listed and agreed with the findings in the report. NM PET Image Initial (PI) Skull Base To Thigh  Result Date: 10/02/2019 CLINICAL DATA:  Initial treatment strategy for mantle cell lymphoma. EXAM: NUCLEAR MEDICINE PET SKULL BASE TO THIGH TECHNIQUE: 6.1 mCi F-18 FDG was injected intravenously. Full-ring PET imaging was performed from the skull base to thigh after the radiotracer. CT data was obtained and used for attenuation correction and anatomic localization. Fasting blood glucose: 96 mg/dl COMPARISON:  None. FINDINGS: Mediastinal blood pool activity: SUV max 2.6 Liver activity: SUV max NA NECK: Multiple small bilateral cervical nodes, including a 9 mm short axis left level 2 node (series 4/image 29), max SUV 2.3. Incidental CT findings: none CHEST: Multiple bilateral axillary nodes, including an 11 mm short axis left axillary node (series 4/image 85), max SUV 3.6. Adjacent surgical clip, indicating this likely corresponds to the patient's biopsy-proven mantle cell lymphoma. No hypermetabolic mediastinal or hilar lymphadenopathy. No suspicious pulmonary nodules. Incidental CT findings: Atherosclerotic calcifications of the descending thoracic aorta. Mild coronary atherosclerosis of the LAD. ABDOMEN/PELVIS: Small retroperitoneal/pelvic lymph nodes, including: --8 mm short axis left external iliac node (series 4/image 138), max SUV 3.1 --Bilateral pelvic sidewall nodes measuring up to 8 mm short axis on the left (series 4/image 164), max SUV 4.0  --Bilateral inguinal nodes measuring up to 8 mm short axis on the left (series 4/image 175), max SUV 2.9 Spleen is normal in size, without associated hypermetabolism. No abnormal metabolism in the liver, pancreas, or adrenal glands. Incidental CT findings: Status post cholecystectomy. Atherosclerotic calcifications the abdominal aorta and branch vessels. Status post hysterectomy. Trace pelvic ascites. SKELETON: No focal hypermetabolic activity to suggest skeletal metastasis. Incidental CT findings: Degenerative changes of the visualized thoracolumbar spine. IMPRESSION: Small cervical, bilateral axillary, retroperitoneal, and bilateral pelvic lymph nodes, corresponding to the patient's known mantle cell lymphoma, as above. Spleen is normal in size. Electronically Signed   By: Julian Hy M.D.   On: 10/02/2019 17:00     ASSESSMENT & PLAN:   [place] is a 74 y.o. female with:  1. 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 09/05/2019 Surgical Pathology addendum which revealed "CCN D1 (BCL-1) translocation: DETECTED - The CCND1/IgH t(11;14) translocation is highly specific (70 to 95%) for mantle cell lymphoma (MCL) and can be used for diagnosis, monitoring efficacy of therapy and the detection of minimal residual disease." 09/20/2019 FISH (CLL Prognostic) Panel which revealed "No mutations identified". 10/02/2019 PET/CT ([id number]) which revealed "Small cervical, bilateral axillary, retroperitoneal, and bilateral pelvic lymph nodes, corresponding to the patient's known mantle cell lymphoma, as above. Spleen is normal in size." PLAN: -Discussed pt labwork today, 11/01/19; all values are WNL except for RBC  at 3.82, Hgb at 11.8, HCT at 35.5, Mono Abs at 1.2K, Sodium at 133, BUN at 6. -The pt has no prohibitive toxicities from proceding with C2 Bendamustine/Rituxan in 2 weeks. -Recommended that the pt continue to eat well, drink at least  48-64 oz of water each day, and walk 20-30 minutes each day.  -Will see back in 2 weeks with labs     FOLLOW UP: Labs today F/u in 2 weeks as scheduled for C2 of BR chemotherapy   The total time spent in the appt was 20 minutes and more than 50% was on counseling and direct patient cares.  All of the patient's questions were answered with apparent satisfaction. The patient knows to call the clinic with any problems, questions or concerns.    Sullivan Lone MD [place] AAHIVMS [facility] Hematology/Oncology Physician [facility]  (Office):       [phone] (Work cell):  [phone] (Fax):           [phone]  11/01/2019 7:53 AM  I, Yevette Edwards, am acting as a scribe for Dr. Sullivan Lone.   .I have reviewed the above documentation for accuracy and completeness, and I agree with the above. Brunetta Genera MD

## 2019-11-02 ENCOUNTER — Telehealth: Payer: Self-pay | Admitting: Hematology

## 2019-11-02 NOTE — Telephone Encounter (Signed)
Scheduled per 09/16 los, patient has been called and voicemail was left.

## 2019-11-13 ENCOUNTER — Other Ambulatory Visit: Payer: Self-pay | Admitting: Hematology

## 2019-11-13 DIAGNOSIS — C8318 Mantle cell lymphoma, lymph nodes of multiple sites: Secondary | ICD-10-CM

## 2019-11-13 NOTE — Progress Notes (Unsigned)
Cbc

## 2019-11-14 ENCOUNTER — Other Ambulatory Visit: Payer: Self-pay

## 2019-11-14 ENCOUNTER — Inpatient Hospital Stay: Payer: Medicare HMO

## 2019-11-14 ENCOUNTER — Inpatient Hospital Stay: Payer: Medicare HMO | Attending: Hematology | Admitting: Hematology

## 2019-11-14 VITALS — BP 124/74 | HR 90 | Temp 98.5°F | Resp 18 | Ht 63.0 in | Wt 116.2 lb

## 2019-11-14 VITALS — BP 124/70 | HR 66 | Temp 98.1°F | Resp 18

## 2019-11-14 DIAGNOSIS — C8318 Mantle cell lymphoma, lymph nodes of multiple sites: Secondary | ICD-10-CM | POA: Diagnosis not present

## 2019-11-14 DIAGNOSIS — Z5111 Encounter for antineoplastic chemotherapy: Secondary | ICD-10-CM | POA: Insufficient documentation

## 2019-11-14 DIAGNOSIS — Z5112 Encounter for antineoplastic immunotherapy: Secondary | ICD-10-CM | POA: Diagnosis not present

## 2019-11-14 DIAGNOSIS — C8594 Non-Hodgkin lymphoma, unspecified, lymph nodes of axilla and upper limb: Secondary | ICD-10-CM | POA: Insufficient documentation

## 2019-11-14 DIAGNOSIS — Z7189 Other specified counseling: Secondary | ICD-10-CM

## 2019-11-14 LAB — CMP (CANCER CENTER ONLY)
ALT: 10 U/L (ref 0–44)
AST: 16 U/L (ref 15–41)
Albumin: 3.8 g/dL (ref 3.5–5.0)
Alkaline Phosphatase: 74 U/L (ref 38–126)
Anion gap: 6 (ref 5–15)
BUN: 13 mg/dL (ref 8–23)
CO2: 27 mmol/L (ref 22–32)
Calcium: 9.7 mg/dL (ref 8.9–10.3)
Chloride: 109 mmol/L (ref 98–111)
Creatinine: 0.77 mg/dL (ref 0.44–1.00)
GFR, Estimated: >60 mL/min (ref >60)
Glucose, Bld: 86 mg/dL (ref 70–99)
Potassium: 3.5 mmol/L (ref 3.5–5.1)
Sodium: 142 mmol/L (ref 135–145)
Total Bilirubin: 0.9 mg/dL (ref 0.3–1.2)
Total Protein: 6.8 g/dL (ref 6.5–8.1)

## 2019-11-14 LAB — CBC WITH DIFFERENTIAL/PLATELET
Abs Immature Granulocytes: 0.03 10*3/uL (ref 0.00–0.07)
Basophils Absolute: 0.1 10*3/uL (ref 0.0–0.1)
Basophils Relative: 1 %
Eosinophils Absolute: 0.2 10*3/uL (ref 0.0–0.5)
Eosinophils Relative: 2 %
HCT: 33.8 % — ABNORMAL LOW (ref 36.0–46.0)
Hemoglobin: 11.4 g/dL — ABNORMAL LOW (ref 12.0–15.0)
Immature Granulocytes: 0 %
Lymphocytes Relative: 4 %
Lymphs Abs: 0.4 10*3/uL — ABNORMAL LOW (ref 0.7–4.0)
MCH: 31.8 pg (ref 26.0–34.0)
MCHC: 33.7 g/dL (ref 30.0–36.0)
MCV: 94.2 fL (ref 80.0–100.0)
Monocytes Absolute: 0.9 10*3/uL (ref 0.1–1.0)
Monocytes Relative: 10 %
Neutro Abs: 7.6 10*3/uL (ref 1.7–7.7)
Neutrophils Relative %: 83 %
Platelets: 171 10*3/uL (ref 150–400)
RBC: 3.59 MIL/uL — ABNORMAL LOW (ref 3.87–5.11)
RDW: 13.4 % (ref 11.5–15.5)
WBC: 9.1 10*3/uL (ref 4.0–10.5)
nRBC: 0 % (ref 0.0–0.2)

## 2019-11-14 LAB — SAMPLE TO BLOOD BANK

## 2019-11-14 LAB — MAGNESIUM: Magnesium: 1.7 mg/dL (ref 1.7–2.4)

## 2019-11-14 MED ORDER — SODIUM CHLORIDE 0.9 % IV SOLN
375.0000 mg/m2 | Freq: Once | INTRAVENOUS | Status: AC
  Administered 2019-11-14: 600 mg via INTRAVENOUS
  Filled 2019-11-14: qty 50

## 2019-11-14 MED ORDER — FAMOTIDINE IN NACL 20-0.9 MG/50ML-% IV SOLN
20.0000 mg | Freq: Once | INTRAVENOUS | Status: AC
  Administered 2019-11-14: 20 mg via INTRAVENOUS

## 2019-11-14 MED ORDER — SODIUM CHLORIDE 0.9 % IV SOLN
90.0000 mg/m2 | Freq: Once | INTRAVENOUS | Status: AC
  Administered 2019-11-14: 150 mg via INTRAVENOUS
  Filled 2019-11-14: qty 6

## 2019-11-14 MED ORDER — DIPHENHYDRAMINE HCL 25 MG PO CAPS
ORAL_CAPSULE | ORAL | Status: AC
  Filled 2019-11-14: qty 2

## 2019-11-14 MED ORDER — SODIUM CHLORIDE 0.9 % IV SOLN
10.0000 mg | Freq: Once | INTRAVENOUS | Status: AC
  Administered 2019-11-14: 10 mg via INTRAVENOUS
  Filled 2019-11-14: qty 10

## 2019-11-14 MED ORDER — SODIUM CHLORIDE 0.9 % IV SOLN
Freq: Once | INTRAVENOUS | Status: AC
  Filled 2019-11-14: qty 250

## 2019-11-14 MED ORDER — FAMOTIDINE IN NACL 20-0.9 MG/50ML-% IV SOLN
INTRAVENOUS | Status: AC
  Filled 2019-11-14: qty 50

## 2019-11-14 MED ORDER — SODIUM CHLORIDE 0.9 % IV SOLN: 375.0000 mg/m2 | Freq: Once | INTRAVENOUS | Status: DC

## 2019-11-14 MED ORDER — ACETAMINOPHEN 325 MG PO TABS
650.0000 mg | ORAL_TABLET | Freq: Once | ORAL | Status: AC
  Administered 2019-11-14: 650 mg via ORAL

## 2019-11-14 MED ORDER — PALONOSETRON HCL INJECTION 0.25 MG/5ML
INTRAVENOUS | Status: AC
  Filled 2019-11-14: qty 5

## 2019-11-14 MED ORDER — DIPHENHYDRAMINE HCL 25 MG PO CAPS
50.0000 mg | ORAL_CAPSULE | Freq: Once | ORAL | Status: AC
  Administered 2019-11-14: 50 mg via ORAL

## 2019-11-14 MED ORDER — ACETAMINOPHEN 325 MG PO TABS
ORAL_TABLET | ORAL | Status: AC
  Filled 2019-11-14: qty 2

## 2019-11-14 MED ORDER — PALONOSETRON HCL INJECTION 0.25 MG/5ML
0.2500 mg | Freq: Once | INTRAVENOUS | Status: AC
  Administered 2019-11-14: 0.25 mg via INTRAVENOUS

## 2019-11-14 MED FILL — Dexamethasone Sodium Phosphate Inj 100 MG/10ML: INTRAMUSCULAR | Qty: 1 | Status: AC

## 2019-11-14 NOTE — Patient Instructions (Signed)
[facility] Discharge Instructions for Patients Receiving Chemotherapy  Today you received the following chemotherapy agents Rituxin; [place]  To help prevent nausea and vomiting after your treatment, we encourage you to take your nausea medication as directed If you develop nausea and vomiting that is not controlled by your nausea medication, call the clinic.   BELOW ARE SYMPTOMS THAT SHOULD BE REPORTED IMMEDIATELY:  *FEVER GREATER THAN 100.5 F  *CHILLS WITH OR WITHOUT FEVER  NAUSEA AND VOMITING THAT IS NOT CONTROLLED WITH YOUR NAUSEA MEDICATION  *UNUSUAL SHORTNESS OF BREATH  *UNUSUAL BRUISING OR BLEEDING  TENDERNESS IN MOUTH AND THROAT WITH OR WITHOUT PRESENCE OF ULCERS  *URINARY PROBLEMS  *BOWEL PROBLEMS  UNUSUAL RASH Items with * indicate a potential emergency and should be followed up as soon as possible.  Feel free to call the clinic should you have any questions or concerns. The clinic phone number is [phone].  Please show the [place] at check-in to the Emergency Department and triage nurse.

## 2019-11-14 NOTE — Progress Notes (Signed)
HEMATOLOGY/ONCOLOGY CLINIC NOTE  Date of Service: 11/14/2019  Patient Care Team: Tamsen Roers, MD as PCP - General (Family Medicine)  REFERRING PHYSICIAN: Tamsen Roers, MD  CHIEF COMPLAINTS/PURPOSE OF CONSULTATION:  Non-Hodgkin's Lymphoma  HISTORY OF PRESENTING ILLNESS:  Brooke [name] is a wonderful 74 y.o. female who has been referred to [place] by Tamsen Roers, MD for evaluation and management of Non-Hodgkin's Lymphoma. Pt is accompanied today by her sister. The pt reports that she is doing well overall.   The pt reports she is good. At one time she had a not under her armpit in the left side. They kept doing [place] for about a year and finally did a biopsy. The not to her disappeared and she did not feel it anymore. Pt had a US of the breast and tried to put it off due to having her COVID19 vaccine but they still had her coming in.   Over the last year pt has not had fever, chills, and  unexpected weight change. She has had night sweats but not drenching for several years now. Pt has lost weight due to stress over the last 2 months.   Of note prior to the patient's visit today, pt has had Surgical Pathology completed on 09/05/19 with results revealing "Lymph node, needle/core biopsy, left axilla CD5-POSITIVE NON-HODGKIN B-CELL LYMPHOMA, SEE COMMENT"  On review of systems, pt denies fevers, chills, unexpected weight changes, night sweats, new lumps/bumps, back pain, abdominal pain, skin rashes and any other symptoms.   On Social Hx the pt reports never smoked, social alcohol use  On Family Hx the pt reports many people in her family have had cancer, her second cousin had breast cancer in her early 22's.   INTERVAL HISTORY:  Brooke [name] is a wonderful 74 y.o. female who is here for evaluation and management of Non-Hodgkin's Lymphoma. She is here for C2  Bendamustine + Rituxan. The patient's last visit with [place] was on 11/01/2019. The pt reports that she is  doing well overall.  The pt reports that she has been stressed since she recently lost her husband. No fevers/chills/nightsweats. Overall physically feeling okay. Labs reviewed   MEDICAL HISTORY:  Past Medical History:  Diagnosis Date  . Allergy   . Arthritis   . Back pain   . Complication of anesthesia    bp dropped yrs ago, recent surgeries went ok  . Dysrhythmia    occ, no meds for  . GERD (gastroesophageal reflux disease)   . H. pylori infection   . Headache    migraines occ  . Hiatal hernia   . Hx of adenomatous colonic polyps 11/30/2017  . Hyperlipidemia   . Hypothyroidism 1970's   hx of years ago, no meds for   . Shortness of breath dyspnea    with exertion, pt told comes from acid reflux     SURGICAL HISTORY: Past Surgical History:  Procedure Laterality Date  . APPENDECTOMY    . COLONOSCOPY  12/07/2013  . DILATION AND CURETTAGE OF UTERUS    . LAPAROSCOPIC CHOLECYSTECTOMY SINGLE SITE WITH INTRAOPERATIVE CHOLANGIOGRAM N/A 10/28/2015   Procedure: LAPAROSCOPIC CHOLECYSTECTOMY WITH INTRAOPERATIVE CHOLANGIOGRAM;  Surgeon: Armandina Gemma, MD;  Location: [facility];  Service: General;  Laterality: N/A;  . LAPAROSCOPY  yrs ago   x 2  . TMJ ARTHROSCOPY    . TOTAL ABDOMINAL HYSTERECTOMY     complete     SOCIAL HISTORY: Social History   Socioeconomic History  . Marital status: Married  Spouse name: Not on file  . Number of children: Not on file  . Years of education: Not on file  . Highest education level: Not on file  Occupational History  . Not on file  Tobacco Use  . Smoking status: Never Smoker  . Smokeless tobacco: Never Used  Vaping Use  . Vaping Use: Never used  Substance and Sexual Activity  . Alcohol use: Yes    Alcohol/week: 4.0 - 5.0 standard drinks    Types: 4 - 5 Cans of beer per week    Comment: 4-5 beers weekly-occ per pt  . Drug use: No  . Sexual activity: Not on file  Other Topics Concern  . Not on file  Social History Narrative  . Not  on file   Social Determinants of Health   Financial Resource Strain:   . Difficulty of Paying Living Expenses: Not on file  Food Insecurity:   . Worried About Charity fundraiser in the Last Year: Not on file  . Ran Out of Food in the Last Year: Not on file  Transportation Needs:   . Lack of Transportation (Medical): Not on file  . Lack of Transportation (Non-Medical): Not on file  Physical Activity:   . Days of Exercise per Week: Not on file  . Minutes of Exercise per Session: Not on file  Stress:   . Feeling of Stress : Not on file  Social Connections:   . Frequency of Communication with Friends and Family: Not on file  . Frequency of Social Gatherings with Friends and Family: Not on file  . Attends Religious Services: Not on file  . Active Member of Clubs or Organizations: Not on file  . Attends Archivist Meetings: Not on file  . Marital Status: Not on file  Intimate Partner Violence:   . Fear of Current or Ex-Partner: Not on file  . Emotionally Abused: Not on file  . Physically Abused: Not on file  . Sexually Abused: Not on file     FAMILY HISTORY: Family History  Problem Relation Age of Onset  . Colon cancer Father        early 28's  . Ovarian cancer Sister   . Kidney cancer Brother   . Colon polyps Brother   . Colon cancer Paternal Grandfather        unknown age of onset  . Lung cancer Brother   . Colon polyps Sister   . Breast cancer Maternal Aunt   . Breast cancer Cousin   . Rectal cancer Neg Hx   . Stomach cancer Neg Hx   . Esophageal cancer Neg Hx      ALLERGIES:   has No Known Allergies.   MEDICATIONS:  Current Outpatient Medications  Medication Sig Dispense Refill  . acyclovir (ZOVIRAX) 400 MG tablet Take 1 tablet (400 mg total) by mouth 2 (two) times daily. 30 tablet 3  . ALPRAZolam (XANAX) 0.5 MG tablet Take 0.5 mg by mouth at bedtime as needed for anxiety.     . Cholecalciferol (VITAMIN D3) 5000 units CAPS Take 1 capsule by mouth  daily.    . cyclobenzaprine (FLEXERIL) 10 MG tablet 10 mg.     . dexamethasone (DECADRON) 4 MG tablet Take 2 tablets (8 mg total) by mouth daily. Start the day after bendamustine chemotherapy for 2 days. Take with food. 30 tablet 1  . HYDROcodone-acetaminophen (NORCO/VICODIN) 5-325 MG tablet Take 1-2 tablets by mouth every 4 (four) hours as needed for moderate  pain. 20 tablet 0  . ibuprofen (ADVIL,MOTRIN) 200 MG tablet Take 400 mg by mouth every 6 (six) hours as needed.    [name] Kitchen LORazepam (ATIVAN) 0.5 MG tablet Take 1 tablet (0.5 mg total) by mouth every 6 (six) hours as needed (Nausea or vomiting). 30 tablet 0  . ondansetron (ZOFRAN) 8 MG tablet Take 1 tablet (8 mg total) by mouth 2 (two) times daily as needed for refractory nausea / vomiting. Start on day 2 after bendamustine chemo. 30 tablet 1  . oxymetazoline (AFRIN) 0.05 % nasal spray Place 1 spray into both nostrils as needed for congestion.    . pantoprazole (PROTONIX) 40 MG tablet Take 1 tablet (40 mg total) by mouth every morning. Before breakfast (Patient taking differently: Take 40 mg by mouth every evening. Before breakfast) 90 tablet 1  . prochlorperazine (COMPAZINE) 10 MG tablet Take 1 tablet (10 mg total) by mouth every 6 (six) hours as needed (Nausea or vomiting). 30 tablet 1  . promethazine (PHENERGAN) 25 MG tablet Take 1 tablet (25 mg total) by mouth every 6 (six) hours as needed for nausea or vomiting. 10 tablet 0  . rosuvastatin (CRESTOR) 5 MG tablet Take 5 mg by mouth daily.     No current facility-administered medications for this visit.     REVIEW OF SYSTEMS:   A 10+ POINT REVIEW OF SYSTEMS WAS OBTAINED including neurology, dermatology, psychiatry, cardiac, respiratory, lymph, extremities, GI, GU, Musculoskeletal, constitutional, breasts, reproductive, HEENT.  All pertinent positives are noted in the HPI.  All others are negative.   PHYSICAL EXAMINATION: ECOG PERFORMANCE STATUS: 1 - Symptomatic but completely  ambulatory  There were no vitals filed for this visit. There were no vitals filed for this visit. There is no height or weight on file to calculate BMI.   NAD GENERAL:alert, in no acute distress and comfortable SKIN: no acute rashes, no significant lesions EYES: conjunctiva are pink and non-injected, sclera anicteric OROPHARYNX: MMM, no exudates, no oropharyngeal erythema or ulceration NECK: supple, no JVD LYMPH:  no palpable lymphadenopathy in the cervical, axillary or inguinal regions LUNGS: clear to auscultation b/l with normal respiratory effort HEART: regular rate & rhythm ABDOMEN:  normoactive bowel sounds , non tender, not distended. No palpable hepatosplenomegaly.  Extremity: no pedal edema PSYCH: alert & oriented x 3 with fluent speech NEURO: no focal motor/sensory deficits  LABORATORY DATA:  I have reviewed the data as listed  CBC Latest Ref Rng & Units 11/14/2019 11/01/2019 10/19/2019  WBC 4.0 - 10.5 K/uL 9.1 7.4 8.6  Hemoglobin 12.0 - 15.0 g/dL 11.4(L) 11.8(L) 12.3  Hematocrit 36 - 46 % 33.8(L) 35.5(L) 36.6  Platelets 150 - 400 K/uL 171 241 238    CMP Latest Ref Rng & Units 11/14/2019 11/01/2019 10/19/2019  Glucose 70 - 99 mg/dL 86 93 114(H)  BUN 8 - 23 mg/dL 13 6(L) 8  Creatinine 0.44 - 1.00 mg/dL 0.77 0.75 0.80  Sodium 135 - 145 mmol/L 142 133(L) 137  Potassium 3.5 - 5.1 mmol/L 3.5 3.8 3.9  Chloride 98 - 111 mmol/L 109 98 102  CO2 22 - 32 mmol/L 27 29 25   Calcium 8.9 - 10.3 mg/dL 9.7 9.9 9.6  Total Protein 6.5 - 8.1 g/dL 6.8 7.0 7.4  Total Bilirubin 0.3 - 1.2 mg/dL 0.9 0.6 0.6  Alkaline Phos 38 - 126 U/L 74 75 71  AST 15 - 41 U/L 16 17 14(L)  ALT 0 - 44 U/L 10 11 7    09/20/2019 FISH (CLL Prognostic) Panel:  09/05/19 Surgical Pathology Addendum:   09/05/19 Surgical Pathology     RADIOGRAPHIC STUDIES: I have personally reviewed the radiological images as listed and agreed with the findings in the report. No results found.   ASSESSMENT & PLAN:    Brooke [name] is a 74 y.o. female with:  1. 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 09/05/2019 Surgical Pathology addendum which revealed "CCN D1 (BCL-1) translocation: DETECTED - The CCND1/IgH t(11;14) translocation is highly specific (70 to 95%) for mantle cell lymphoma (MCL) and can be used for diagnosis, monitoring efficacy of therapy and the detection of minimal residual disease." 09/20/2019 FISH (CLL Prognostic) Panel which revealed "No mutations identified". 10/02/2019 PET/CT ([id number]) which revealed "Small cervical, bilateral axillary, retroperitoneal, and bilateral pelvic lymph nodes, corresponding to the patient's known mantle cell lymphoma, as above. Spleen is normal in size." PLAN: -labs reviewed stable. -The pt has no prohibitive toxicities from proceding with C2 Bendamustine/Rituxan in at this time. -will continue same premedications and anti nausea medications.  FOLLOW UP:    Plz schedule C3 of BR (D1 and D2) of treatment Labs and MD visit with C3D1 in 4 weeks  The total time spent in the appt was 20 minutes and more than 50% was on counseling and direct patient cares.  All of the patient's questions were answered with apparent satisfaction. The patient knows to call the clinic with any problems, questions or concerns.    Sullivan Lone MD [place] AAHIVMS [facility] Hematology/Oncology Physician [facility]  (Office):       [phone] (Work cell):  [phone] (Fax):           [phone]  11/14/2019 8:06 AM  I, Yevette Edwards, am acting as a scribe for Dr. Sullivan Lone.   .I have reviewed the above documentation for accuracy and completeness, and I agree with the above. Brunetta Genera MD

## 2019-11-15 ENCOUNTER — Other Ambulatory Visit: Payer: Self-pay | Admitting: *Deleted

## 2019-11-15 ENCOUNTER — Inpatient Hospital Stay (HOSPITAL_BASED_OUTPATIENT_CLINIC_OR_DEPARTMENT_OTHER): Payer: Medicare HMO | Admitting: Hematology

## 2019-11-15 ENCOUNTER — Telehealth: Payer: Self-pay | Admitting: *Deleted

## 2019-11-15 ENCOUNTER — Inpatient Hospital Stay: Payer: Medicare HMO

## 2019-11-15 DIAGNOSIS — C8318 Mantle cell lymphoma, lymph nodes of multiple sites: Secondary | ICD-10-CM

## 2019-11-15 DIAGNOSIS — R21 Rash and other nonspecific skin eruption: Secondary | ICD-10-CM | POA: Diagnosis not present

## 2019-11-15 DIAGNOSIS — Z5112 Encounter for antineoplastic immunotherapy: Secondary | ICD-10-CM | POA: Diagnosis not present

## 2019-11-15 MED ORDER — METHYLPREDNISOLONE SODIUM SUCC 125 MG IJ SOLR
INTRAMUSCULAR | Status: AC
  Filled 2019-11-15: qty 2

## 2019-11-15 MED ORDER — METHYLPREDNISOLONE SODIUM SUCC 125 MG IJ SOLR
80.0000 mg | Freq: Once | INTRAMUSCULAR | Status: AC
  Administered 2019-11-15: 80 mg via INTRAVENOUS

## 2019-11-15 MED ORDER — PREDNISONE 20 MG PO TABS: 20.0000 mg | ORAL_TABLET | Freq: Every day | ORAL | 0 refills | Status: DC

## 2019-11-15 MED ORDER — ACETAMINOPHEN 325 MG PO TABS
650.0000 mg | ORAL_TABLET | Freq: Once | ORAL | Status: AC
  Administered 2019-11-15: 650 mg via ORAL

## 2019-11-15 MED ORDER — ACETAMINOPHEN 325 MG PO TABS
ORAL_TABLET | ORAL | Status: AC
  Filled 2019-11-15: qty 2

## 2019-11-15 MED ORDER — DIPHENHYDRAMINE HCL 25 MG PO CAPS
ORAL_CAPSULE | ORAL | Status: AC
  Filled 2019-11-15: qty 2

## 2019-11-15 MED ORDER — DIPHENHYDRAMINE HCL 25 MG PO CAPS
50.0000 mg | ORAL_CAPSULE | Freq: Once | ORAL | Status: AC
  Administered 2019-11-15: 50 mg via ORAL

## 2019-11-15 MED ORDER — SODIUM CHLORIDE 0.9 % IV SOLN
Freq: Once | INTRAVENOUS | Status: AC
  Filled 2019-11-15: qty 250

## 2019-11-15 NOTE — Telephone Encounter (Signed)
Pt daughter called stating pt has rash that covers back face and chest that was not there yesterday. Benadryl was given last night but not any better. Pt stated, "I feel like I'm burning." Temp was 99.8 @ 6am. At 10am temp 100.0. Advised daughter to bring pt in as soon as possible. Per Dr.Kale, pt will not receive treatment today due to symptoms. Pt daughter verbalized understanding

## 2019-11-15 NOTE — Patient Instructions (Signed)

## 2019-11-16 ENCOUNTER — Other Ambulatory Visit: Payer: Self-pay | Admitting: Hematology

## 2019-11-16 ENCOUNTER — Telehealth: Payer: Self-pay | Admitting: *Deleted

## 2019-11-16 DIAGNOSIS — C8318 Mantle cell lymphoma, lymph nodes of multiple sites: Secondary | ICD-10-CM

## 2019-11-16 DIAGNOSIS — Z7189 Other specified counseling: Secondary | ICD-10-CM

## 2019-11-16 NOTE — Telephone Encounter (Signed)
Ms.Doe called - she missed her bendeka infusion on 10/7 due to not feeling well. She said she feels better. Dr. Irene Limbo informed. Per Dr. Irene Limbo - please r/s C2D2 of Bendeka in 5-7 days - No change in Days 1 & 2 of Cycle 3 on 11/3 and 11/4.  Patient called and informed. Schedule message sent.

## 2019-11-19 ENCOUNTER — Other Ambulatory Visit: Payer: Self-pay | Admitting: Hematology

## 2019-11-19 ENCOUNTER — Other Ambulatory Visit: Payer: Self-pay | Admitting: *Deleted

## 2019-11-19 DIAGNOSIS — C8318 Mantle cell lymphoma, lymph nodes of multiple sites: Secondary | ICD-10-CM

## 2019-11-20 NOTE — Telephone Encounter (Signed)
Please review for refill.  

## 2019-11-21 ENCOUNTER — Other Ambulatory Visit: Payer: Self-pay | Admitting: Hematology

## 2019-11-21 DIAGNOSIS — Z7189 Other specified counseling: Secondary | ICD-10-CM

## 2019-11-21 DIAGNOSIS — C8318 Mantle cell lymphoma, lymph nodes of multiple sites: Secondary | ICD-10-CM

## 2019-11-21 MED ORDER — DEXAMETHASONE 4 MG PO TABS: 8.0000 mg | ORAL_TABLET | Freq: Every day | ORAL | 1 refills | Status: DC

## 2019-11-21 NOTE — Progress Notes (Signed)
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 ([place])   10/19/2019 -  Chemotherapy   The patient had dexamethasone (DECADRON) 4 MG tablet, 8 mg, Oral, Daily, 1 of 1 cycle, Start date: 10/08/2019, End date: -- palonosetron (ALOXI) injection 0.25 mg, 0.25 mg, Intravenous,  Once, 2 of 4 cycles Administration: 0.25 mg (10/19/2019), 0.25 mg (11/14/2019) bendamustine (BENDEKA) 150 mg in sodium chloride 0.9 % 50 mL (2.6786 mg/mL) chemo infusion, 90 mg/m2 = 150 mg, Intravenous,  Once, 2 of 4 cycles Administration: 150 mg (10/19/2019), 150 mg (10/20/2019), 150 mg (11/14/2019) riTUXimab-pvvr (RUXIENCE) 600 mg in sodium chloride 0.9 % 250 mL (1.9355 mg/mL) infusion, 375 mg/m2 = 600 mg, Intravenous,  Once, 2 of 2 cycles Administration: 600 mg (10/19/2019)  for chemotherapy treatment.      INTERVAL HISTORY: Patient was seen in f/u when she arrived for C2D2 of Bendeka. She notes that after receiving rapid infusion of Rituxan for C2D1she developed an itchy erythematous rash on her face , upper trunk and upper extremities and some low grade fever. She was given solumedrol, tylenol, famotidine , benadryl today and the symptoms rapidly improved by 80-90%. Her C2D2 Verl Dicker was postponed by 5-7 days and she was sent home with Prednisone/Claritin/famotidine x 5 days.  REVIEW OF SYSTEMS:    10 Point review of systems of done and is negative except as noted above.  . Past Medical History:  Diagnosis Date  . Allergy   . Arthritis   . Back pain   . Complication of anesthesia    bp dropped yrs ago, recent surgeries went ok  . Dysrhythmia    occ, no meds for  . GERD (gastroesophageal reflux disease)   . H. pylori  infection   . Headache    migraines occ  . Hiatal hernia   . Hx of adenomatous colonic polyps 11/30/2017  . Hyperlipidemia   . Hypothyroidism 1970's   hx of years ago, no meds for   . Shortness of breath dyspnea    with exertion, pt told comes from acid reflux    . Past Surgical History:  Procedure Laterality Date  . APPENDECTOMY    . COLONOSCOPY  12/07/2013  . DILATION AND CURETTAGE OF UTERUS    . LAPAROSCOPIC CHOLECYSTECTOMY SINGLE SITE WITH INTRAOPERATIVE CHOLANGIOGRAM N/A 10/28/2015   Procedure: LAPAROSCOPIC CHOLECYSTECTOMY WITH INTRAOPERATIVE CHOLANGIOGRAM;  Surgeon: Armandina Gemma, MD;  Location: [facility];  Service: General;  Laterality: N/A;  . LAPAROSCOPY  yrs ago   x 2  . TMJ ARTHROSCOPY    . TOTAL ABDOMINAL HYSTERECTOMY     complete    . Social History   Tobacco Use  . Smoking status: Never Smoker  . Smokeless tobacco: Never Used  Vaping Use  . Vaping Use: Never used  Substance Use Topics  . Alcohol use: Yes    Alcohol/week: 4.0 - 5.0 standard drinks    Types: 4 - 5 Cans of beer per week    Comment: 4-5 beers weekly-occ per pt  . Drug use: No    ALLERGIES:  has No Known Allergies.  MEDICATIONS:  Current Outpatient Medications  Medication Sig Dispense Refill  . acyclovir (ZOVIRAX) 400 MG tablet TAKE 1 TABLET BY MOUTH TWICE  A DAY 30 tablet 3  . ALPRAZolam (XANAX) 0.5 MG tablet Take 0.5 mg by mouth at bedtime as needed for anxiety.     . Cholecalciferol (VITAMIN D3) 5000 units CAPS Take 1 capsule by mouth daily.    . cyclobenzaprine (FLEXERIL) 10 MG tablet 10 mg.     . dexamethasone (DECADRON) 4 MG tablet Take 2 tablets (8 mg total) by mouth daily. Start the day after bendamustine chemotherapy for 2 days. Take with food. 30 tablet 1  . HYDROcodone-acetaminophen (NORCO/VICODIN) 5-325 MG tablet Take 1-2 tablets by mouth every 4 (four) hours as needed for moderate pain. 20 tablet 0  . ibuprofen (ADVIL,MOTRIN) 200 MG tablet Take 400 mg by mouth every 6 (six) hours  as needed.    [name] Kitchen LORazepam (ATIVAN) 0.5 MG tablet Take 1 tablet (0.5 mg total) by mouth every 6 (six) hours as needed (Nausea or vomiting). 30 tablet 0  . ondansetron (ZOFRAN) 8 MG tablet Take 1 tablet (8 mg total) by mouth 2 (two) times daily as needed for refractory nausea / vomiting. Start on day 2 after bendamustine chemo. 30 tablet 1  . oxymetazoline (AFRIN) 0.05 % nasal spray Place 1 spray into both nostrils as needed for congestion.    . pantoprazole (PROTONIX) 40 MG tablet Take 1 tablet (40 mg total) by mouth every morning. Before breakfast (Patient taking differently: Take 40 mg by mouth every evening. Before breakfast) 90 tablet 1  . predniSONE (DELTASONE) 20 MG tablet Take 1 tablet (20 mg total) by mouth daily with breakfast. 5 tablet 0  . prochlorperazine (COMPAZINE) 10 MG tablet Take 1 tablet (10 mg total) by mouth every 6 (six) hours as needed (Nausea or vomiting). 30 tablet 1  . promethazine (PHENERGAN) 25 MG tablet Take 1 tablet (25 mg total) by mouth every 6 (six) hours as needed for nausea or vomiting. 10 tablet 0  . rosuvastatin (CRESTOR) 5 MG tablet Take 5 mg by mouth daily.     No current facility-administered medications for this visit.    PHYSICAL EXAMINATION: GENERAL:alert, in no acute distress and comfortable SKIN: resolving erythematous rash on face, upper trunk and upper extremities EYES: conjunctiva are pink and non-injected, sclera anicteric OROPHARYNX: MMM, no exudates, no oropharyngeal erythema or ulceration NECK: supple, no JVD LYMPH:  no palpable lymphadenopathy in the cervical, axillary or inguinal regions LUNGS: clear to auscultation b/l with normal respiratory effort HEART: regular rate & rhythm ABDOMEN:  normoactive bowel sounds , non tender, not distended. Extremity: no pedal edema PSYCH: alert & oriented x 3 with fluent speech NEURO: no focal motor/sensory deficits  LABORATORY DATA:   I have reviewed the data as listed  . CBC Latest Ref Rng &  Units 11/14/2019 11/01/2019 10/19/2019  WBC 4.0 - 10.5 K/uL 9.1 7.4 8.6  Hemoglobin 12.0 - 15.0 g/dL 11.4(L) 11.8(L) 12.3  Hematocrit 36 - 46 % 33.8(L) 35.5(L) 36.6  Platelets 150 - 400 K/uL 171 241 238    . CMP Latest Ref Rng & Units 11/14/2019 11/01/2019 10/19/2019  Glucose 70 - 99 mg/dL 86 93 114(H)  BUN 8 - 23 mg/dL 13 6(L) 8  Creatinine 0.44 - 1.00 mg/dL 0.77 0.75 0.80  Sodium 135 - 145 mmol/L 142 133(L) 137  Potassium 3.5 - 5.1 mmol/L 3.5 3.8 3.9  Chloride 98 - 111 mmol/L 109 98 102  CO2 22 - 32 mmol/L 27 29 25   Calcium 8.9 - 10.3 mg/dL 9.7 9.9 9.6  Total Protein 6.5 - 8.1 g/dL 6.8 7.0 7.4  Total Bilirubin 0.3 - 1.2 mg/dL 0.9 0.6 0.6  Alkaline Phos 38 - 126 U/L 74 75 71  AST 15 - 41 U/L 16 17 14(L)  ALT 0 - 44 U/L 10 11 7      RADIOGRAPHIC STUDIES: I have personally reviewed the radiological images as listed and agreed with the findings in the report. No results found.  ASSESSMENT & PLAN:   74 yo with   1) Stage III/IV Mantle cell lymphoma 2) Allergic reaction to Rapid Rituxan. - rash and low grade fever - nearly resolved with solumedrol/tylenol, benadryl and famotidine today PLAN -given prescription for prednisone , claritin and famotidine x 5 days -patient will call us if worsening/persistent rash or other allergic symptoms -will reschedule C2D2 of Bendeka in 5-7 days -will need to avodi rapid rituxan in future -will premedicate with 3 days of dexamethasone prior to C3 of BR -RTC with Dr Irene Limbo with labs with C3D1 of BR  I spent 20 minutes counseling the patient face to face. The total time spent in the appointment was 25 minutes and more than 50% was on counseling and direct patient cares.    Sullivan Lone MD [place] AAHIVMS [facility] Hematology/Oncology Physician [facility]  (Office):       [phone] (Work cell):  [phone] (Fax):           [phone]

## 2019-11-21 NOTE — Progress Notes (Signed)
Patient will receive Cycle 2 day 2 on 11/23/19 per Dr. Irene Limbo. Date changed per his instructions.

## 2019-11-22 ENCOUNTER — Telehealth: Payer: Self-pay | Admitting: *Deleted

## 2019-11-22 NOTE — Telephone Encounter (Signed)
Contacted patient per Dr. Grier Mitts directions: Please check on Brooke [name] to see if she is doing okay following the Rituxan reaction with last infusion. Let her know I sent in new prescription for Dexamethasone to start 3 days prior to next scheduled treatment on 11/3 to reduce risk of allergic reaction. She will start 3 days before and take until 2 days after infusion.  Patient stated all rash is gone from reaction. She verbalized understanding of medication and encouraged to contact office with any questions.

## 2019-11-23 ENCOUNTER — Inpatient Hospital Stay: Payer: Medicare HMO

## 2019-11-23 ENCOUNTER — Other Ambulatory Visit: Payer: Self-pay

## 2019-11-23 VITALS — BP 117/67 | HR 76 | Temp 98.0°F | Resp 16

## 2019-11-23 DIAGNOSIS — Z5112 Encounter for antineoplastic immunotherapy: Secondary | ICD-10-CM | POA: Diagnosis not present

## 2019-11-23 DIAGNOSIS — C8318 Mantle cell lymphoma, lymph nodes of multiple sites: Secondary | ICD-10-CM

## 2019-11-23 DIAGNOSIS — Z7189 Other specified counseling: Secondary | ICD-10-CM

## 2019-11-23 LAB — CBC WITH DIFFERENTIAL (CANCER CENTER ONLY)
Abs Immature Granulocytes: 0.39 10*3/uL — ABNORMAL HIGH (ref 0.00–0.07)
Basophils Absolute: 0 10*3/uL (ref 0.0–0.1)
Basophils Relative: 1 %
Eosinophils Absolute: 0.2 10*3/uL (ref 0.0–0.5)
Eosinophils Relative: 2 %
HCT: 34.4 % — ABNORMAL LOW (ref 36.0–46.0)
Hemoglobin: 11.3 g/dL — ABNORMAL LOW (ref 12.0–15.0)
Immature Granulocytes: 5 %
Lymphocytes Relative: 3 %
Lymphs Abs: 0.2 10*3/uL — ABNORMAL LOW (ref 0.7–4.0)
MCH: 31.2 pg (ref 26.0–34.0)
MCHC: 32.8 g/dL (ref 30.0–36.0)
MCV: 95 fL (ref 80.0–100.0)
Monocytes Absolute: 1.1 10*3/uL — ABNORMAL HIGH (ref 0.1–1.0)
Monocytes Relative: 15 %
Neutro Abs: 5.7 10*3/uL (ref 1.7–7.7)
Neutrophils Relative %: 74 %
Platelet Count: 218 10*3/uL (ref 150–400)
RBC: 3.62 MIL/uL — ABNORMAL LOW (ref 3.87–5.11)
RDW: 13.9 % (ref 11.5–15.5)
WBC Count: 7.6 10*3/uL (ref 4.0–10.5)
nRBC: 0 % (ref 0.0–0.2)

## 2019-11-23 LAB — CMP (CANCER CENTER ONLY)
ALT: 11 U/L (ref 0–44)
AST: 13 U/L — ABNORMAL LOW (ref 15–41)
Albumin: 3.9 g/dL (ref 3.5–5.0)
Alkaline Phosphatase: 56 U/L (ref 38–126)
Anion gap: 8 (ref 5–15)
BUN: 10 mg/dL (ref 8–23)
CO2: 30 mmol/L (ref 22–32)
Calcium: 9.5 mg/dL (ref 8.9–10.3)
Chloride: 104 mmol/L (ref 98–111)
Creatinine: 0.79 mg/dL (ref 0.44–1.00)
GFR, Estimated: >60 mL/min (ref >60)
Glucose, Bld: 99 mg/dL (ref 70–99)
Potassium: 3.9 mmol/L (ref 3.5–5.1)
Sodium: 142 mmol/L (ref 135–145)
Total Bilirubin: 0.7 mg/dL (ref 0.3–1.2)
Total Protein: 5.9 g/dL — ABNORMAL LOW (ref 6.5–8.1)

## 2019-11-23 MED ORDER — PALONOSETRON HCL INJECTION 0.25 MG/5ML
0.2500 mg | Freq: Once | INTRAVENOUS | Status: AC
  Administered 2019-11-23: 0.25 mg via INTRAVENOUS

## 2019-11-23 MED ORDER — SODIUM CHLORIDE 0.9 % IV SOLN
10.0000 mg | Freq: Once | INTRAVENOUS | Status: AC
  Administered 2019-11-23: 10 mg via INTRAVENOUS
  Filled 2019-11-23: qty 10

## 2019-11-23 MED ORDER — SODIUM CHLORIDE 0.9 % IV SOLN
90.0000 mg/m2 | Freq: Once | INTRAVENOUS | Status: AC
  Administered 2019-11-23: 150 mg via INTRAVENOUS
  Filled 2019-11-23: qty 6

## 2019-11-23 MED ORDER — SODIUM CHLORIDE 0.9 % IV SOLN
Freq: Once | INTRAVENOUS | Status: AC
  Filled 2019-11-23: qty 250

## 2019-11-23 MED ORDER — PALONOSETRON HCL INJECTION 0.25 MG/5ML
INTRAVENOUS | Status: AC
  Filled 2019-11-23: qty 5

## 2019-11-23 NOTE — Patient Instructions (Addendum)
Bendamustine Injection What is this medicine? BENDAMUSTINE (BEN da MUS teen) is a chemotherapy drug. It is used to treat chronic lymphocytic leukemia and non-Hodgkin lymphoma. This medicine may be used for other purposes; ask your health care provider or pharmacist if you have questions. COMMON BRAND NAME(S): Kristine Royal, Treanda What should I tell my health care provider before I take this medicine? They need to know if you have any of these conditions:  infection (especially a virus infection such as chickenpox, cold sores, or herpes)  kidney disease  liver disease  an unusual or allergic reaction to bendamustine, mannitol, other medicines, foods, dyes, or preservatives  pregnant or trying to get pregnant  breast-feeding How should I use this medicine? This medicine is for infusion into a vein. It is given by a health care professional in a hospital or clinic setting. Talk to your pediatrician regarding the use of this medicine in children. Special care may be needed. Overdosage: If you think you have taken too much of this medicine contact a poison control center or emergency room at once. NOTE: This medicine is only for you. Do not share this medicine with others. What if I miss a dose? It is important not to miss your dose. Call your doctor or health care professional if you are unable to keep an appointment. What may interact with this medicine? Do not take this medicine with any of the following medications:  clozapine This medicine may also interact with the following medications:  atazanavir  cimetidine  ciprofloxacin  enoxacin  fluvoxamine  medicines for seizures like carbamazepine and phenobarbital  mexiletine  rifampin  tacrine  thiabendazole  zileuton This list may not describe all possible interactions. Give your health care provider a list of all the medicines, herbs, non-prescription drugs, or dietary supplements you use. Also tell them if  you smoke, drink alcohol, or use illegal drugs. Some items may interact with your medicine. What should I watch for while using this medicine? This drug may make you feel generally unwell. This is not uncommon, as chemotherapy can affect healthy cells as well as cancer cells. Report any side effects. Continue your course of treatment even though you feel ill unless your doctor tells you to stop. You may need blood work done while you are taking this medicine. Call your doctor or healthcare provider for advice if you get a fever, chills or sore throat, or other symptoms of a cold or flu. Do not treat yourself. This drug decreases your body's ability to fight infections. Try to avoid being around people who are sick. This medicine may cause serious skin reactions. They can happen weeks to months after starting the medicine. Contact your healthcare provider right away if you notice fevers or flu-like symptoms with a rash. The rash may be red or purple and then turn into blisters or peeling of the skin. Or, you might notice a red rash with swelling of the face, lips or lymph nodes in your neck or under your arms. This medicine may increase your risk to bruise or bleed. Call your doctor or healthcare provider if you notice any unusual bleeding. Talk to your doctor about your risk of cancer. You may be more at risk for certain types of cancers if you take this medicine. Do not become pregnant while taking this medicine or for at least 6 months after stopping it. Women should inform their doctor if they wish to become pregnant or think they might be pregnant. Men should not  father a child while taking this medicine and for at least 3 months after stopping it. There is a potential for serious side effects to an unborn child. Talk to your healthcare provider or pharmacist for more information. Do not breast-feed an infant while taking this medicine or for at least 1 week after stopping it. This medicine may make it  more difficult to father a child. You should talk with your doctor or healthcare provider if you are concerned about your fertility. What side effects may I notice from receiving this medicine? Side effects that you should report to your doctor or health care professional as soon as possible:  allergic reactions like skin rash, itching or hives, swelling of the face, lips, or tongue  low blood counts - this medicine may decrease the number of white blood cells, red blood cells and platelets. You may be at increased risk for infections and bleeding.  rash, fever, and swollen lymph nodes  redness, blistering, peeling, or loosening of the skin, including inside the mouth  signs of infection like fever or chills, cough, sore throat, pain or difficulty passing urine  signs of decreased platelets or bleeding like bruising, pinpoint red spots on the skin, black, tarry stools, blood in the urine  signs of decreased red blood cells like being unusually weak or tired, fainting spells, lightheadedness  signs and symptoms of kidney injury like trouble passing urine or change in the amount of urine  signs and symptoms of liver injury like dark yellow or brown urine; general ill feeling or flu-like symptoms; light-colored stools; loss of appetite; nausea; right upper belly pain; unusually weak or tired; yellowing of the eyes or skin Side effects that usually do not require medical attention (report to your doctor or health care professional if they continue or are bothersome):  constipation  decreased appetite  diarrhea  headache  mouth sores  nausea, vomiting  tiredness This list may not describe all possible side effects. Call your doctor for medical advice about side effects. You may report side effects to FDA at 1-800-FDA-1088. Where should I keep my medicine? This drug is given in a hospital or clinic and will not be stored at home. NOTE: This sheet is a summary. It may not cover all  possible information. If you have questions about this medicine, talk to your doctor, pharmacist, or health care provider.  2020 [employer]/Gold Standard (2018-04-18 10:26:46) Dexamethasone injection What is this medicine? DEXAMETHASONE (dex a METH a sone) is a corticosteroid. It is used to treat inflammation of the skin, joints, lungs, and other organs. Common conditions treated include asthma, allergies, and arthritis. It is also used for other conditions, like blood disorders and diseases of the adrenal glands. This medicine may be used for other purposes; ask your health care provider or pharmacist if you have questions. COMMON BRAND NAME(S): Decadron, DoubleDex, Simplist Dexamethasone, Solurex What should I tell my health care provider before I take this medicine? They need to know if you have any of these conditions:  Cushing's syndrome  diabetes  glaucoma  heart disease  high blood pressure  infection like herpes, measles, tuberculosis, or chickenpox  kidney disease  liver disease  mental illness  myasthenia gravis  osteoporosis  previous heart attack  seizures  stomach or intestine problems  thyroid disease  an unusual or allergic reaction to dexamethasone, corticosteroids, other medicines, lactose, foods, dyes, or preservatives  pregnant or trying to get pregnant  breast-feeding How should I use this medicine? This medicine  is for injection into a muscle, joint, lesion, soft tissue, or vein. It is given by a health care professional in a hospital or clinic setting. Talk to your pediatrician regarding the use of this medicine in children. Special care may be needed. Overdosage: If you think you have taken too much of this medicine contact a poison control center or emergency room at once. NOTE: This medicine is only for you. Do not share this medicine with others. What if I miss a dose? This may not apply. If you are having a series of injections over a  prolonged period, try not to miss an appointment. Call your doctor or health care professional to reschedule if you are unable to keep an appointment. What may interact with this medicine? Do not take this medicine with any of the following medications:  live virus vaccines This medicine may also interact with the following medications:  aminoglutethimide  amphotericin B  aspirin and aspirin-like medicines  certain antibiotics like erythromycin, clarithromycin, and troleandomycin  certain antivirals for HIV or hepatitis  certain medicines for seizures like carbamazepine, phenobarbital, phenytoin  certain medicines to treat myasthenia gravis  cholestyramine  cyclosporine  digoxin  diuretics  ephedrine  female hormones, like estrogen or progestins and birth control pills  insulin or other medicines for diabetes  isoniazid  ketoconazole  medicines that relax muscles for surgery  mifepristone  NSAIDs, medicines for pain and inflammation, like ibuprofen or naproxen  rifampin  skin tests for allergies  thalidomide  vaccines  warfarin This list may not describe all possible interactions. Give your health care provider a list of all the medicines, herbs, non-prescription drugs, or dietary supplements you use. Also tell them if you smoke, drink alcohol, or use illegal drugs. Some items may interact with your medicine. What should I watch for while using this medicine? Visit your health care professional for regular checks on your progress. Tell your health care professional if your symptoms do not start to get better or if they get worse. Your condition will be monitored carefully while you are receiving this medicine. Wear a medical ID bracelet or chain. Carry a card that describes your disease and details of your medicine and dosage times. This medicine may increase your risk of getting an infection. Call your health care professional for advice if you get a fever,  chills, or sore throat, or other symptoms of a cold or flu. Do not treat yourself. Try to avoid being around people who are sick. Call your health care professional if you are around anyone with measles, chickenpox, or if you develop sores or blisters that do not heal properly. If you are going to need surgery or other procedures, tell your doctor or health care professional that you have taken this medicine within the last 12 months. Ask your doctor or health care professional about your diet. You may need to lower the amount of salt you eat. This medicine may increase blood sugar. Ask your healthcare provider if changes in diet or medicines are needed if you have diabetes. What side effects may I notice from receiving this medicine? Side effects that you should report to your doctor or health care professional as soon as possible:  allergic reactions like skin rash, itching or hives, swelling of the face, lips, or tongue  bloody or black, tarry stools  changes in emotions or moods  changes in vision  confusion, excitement, restlessness  depressed mood  eye pain  hallucinations  muscle weakness  severe  or sudden stomach or belly pain  signs and symptoms of high blood sugar such as being more thirsty or hungry or having to urinate more than normal. You may also feel very tired or have blurry vision.  signs and symptoms of infection like fever; chills; cough; sore throat; pain or trouble passing urine  swelling of ankles, feet  unusual bruising or bleeding  wounds that do not heal Side effects that usually do not require medical attention (report to your doctor or health care professional if they continue or are bothersome):  increased appetite  increased growth of face or body hair  headache  nausea, vomiting  pain, redness, or irritation at site where injected  skin problems, acne, thin and shiny skin  trouble sleeping  weight gain This list may not describe all  possible side effects. Call your doctor for medical advice about side effects. You may report side effects to FDA at 1-800-FDA-1088. Where should I keep my medicine? This medicine is given in a hospital or clinic and will not be stored at home. NOTE: This sheet is a summary. It may not cover all possible information. If you have questions about this medicine, talk to your doctor, pharmacist, or health care provider.  2020 [employer]/Gold Standard (2018-08-08 13:51:58) Palonosetron Injection What is this medicine? PALONOSETRON (pal oh NOE se tron) is used to prevent nausea and vomiting caused by chemotherapy. It also helps prevent delayed nausea and vomiting that may occur a few days after your treatment. This medicine may be used for other purposes; ask your health care provider or pharmacist if you have questions. COMMON BRAND NAME(S): Aloxi What should I tell my health care provider before I take this medicine? They need to know if you have any of these conditions:  an unusual or allergic reaction to palonosetron, dolasetron, granisetron, ondansetron, other medicines, foods, dyes, or preservatives  pregnant or trying to get pregnant  breast-feeding How should I use this medicine? This medicine is for infusion into a vein. It is given by a health care professional in a hospital or clinic setting. Talk to your pediatrician regarding the use of this medicine in children. While this drug may be prescribed for children as young as 1 month for selected conditions, precautions do apply. Overdosage: If you think you have taken too much of this medicine contact a poison control center or emergency room at once. NOTE: This medicine is only for you. Do not share this medicine with others. What if I miss a dose? This does not apply. What may interact with this medicine?  certain medicines for depression, anxiety, or psychotic disturbances  fentanyl  linezolid  MAOIs like Carbex, Eldepryl,  Marplan, Nardil, and Parnate  methylene blue (injected into a vein)  tramadol This list may not describe all possible interactions. Give your health care provider a list of all the medicines, herbs, non-prescription drugs, or dietary supplements you use. Also tell them if you smoke, drink alcohol, or use illegal drugs. Some items may interact with your medicine. What should I watch for while using this medicine? Your condition will be monitored carefully while you are receiving this medicine. What side effects may I notice from receiving this medicine? Side effects that you should report to your doctor or health care professional as soon as possible:  allergic reactions like skin rash, itching or hives, swelling of the face, lips, or tongue  breathing problems  confusion  dizziness  fast, irregular heartbeat  fever and chills  loss  of balance or coordination  seizures  sweating  swelling of the hands and feet  tremors  unusually weak or tired Side effects that usually do not require medical attention (report to your doctor or health care professional if they continue or are bothersome):  constipation or diarrhea  headache This list may not describe all possible side effects. Call your doctor for medical advice about side effects. You may report side effects to FDA at 1-800-FDA-1088. Where should I keep my medicine? This drug is given in a hospital or clinic and will not be stored at home. NOTE: This sheet is a summary. It may not cover all possible information. If you have questions about this medicine, talk to your doctor, pharmacist, or health care provider.  2020 [employer]/Gold Standard (2012-12-01 10:38:36)

## 2019-12-04 ENCOUNTER — Other Ambulatory Visit: Payer: Self-pay | Admitting: Hematology

## 2019-12-04 DIAGNOSIS — C8318 Mantle cell lymphoma, lymph nodes of multiple sites: Secondary | ICD-10-CM

## 2019-12-04 DIAGNOSIS — Z7189 Other specified counseling: Secondary | ICD-10-CM

## 2019-12-07 ENCOUNTER — Telehealth: Payer: Self-pay | Admitting: Hematology

## 2019-12-07 NOTE — Telephone Encounter (Signed)
Scheduled per los, patient has been called and notified. 

## 2019-12-11 NOTE — Progress Notes (Signed)
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[place])   10/19/2019 -  Chemotherapy   The patient had dexamethasone (DECADRON) 4 MG tablet, 8 mg, Oral, Daily, 1 of 1 cycle, Start date: 11/21/2019, End date: -- palonosetron (ALOXI) injection 0.25 mg, 0.25 mg, Intravenous,  Once, 2 of 4 cycles Administration: 0.25 mg (10/19/2019), 0.25 mg (11/14/2019), 0.25 mg (11/23/2019) bendamustine (BENDEKA) 150 mg in sodium chloride 0.9 % 50 mL (2.6786 mg/mL) chemo infusion, 90 mg/m2 = 150 mg, Intravenous,  Once, 2 of 4 cycles Administration: 150 mg (10/19/2019), 150 mg (10/20/2019), 150 mg (11/14/2019), 150 mg (11/23/2019) riTUXimab-pvvr (RUXIENCE) 600 mg in sodium chloride 0.9 % 250 mL (1.9355 mg/mL) infusion, 375 mg/m2 = 600 mg, Intravenous,  Once, 2 of 4 cycles Administration: 600 mg (10/19/2019)  for chemotherapy treatment.      INTERVAL HISTORY: [name] is a wonderful woman who is here for evaluation and management of Mantle Cell Lymphoma. The patient's last visit with [place] was on 11/15/2019. The pt reports that she is doing well overall.  The pt reports that she had minor flushing around her mouth after cycle 2. This resolved with Claritin & Famotidine. She has continued Claritin nightly. Pt notes occasional night sweats. She also reports some decreased energy levels and difficulty sleeping through the night intermittently.   Lab results today (12/12/19) of CBC w/diff and CMP is as follows: all values are WNL except for RBC at 3.66, Hgb at 11.4, HCT at 34.0, Lymphs Abs at 0.4K, Mono Abs at 1.2K, Abs Immature Granulocytes at 0.40K. 12/12/2019 Retic Ct Pct at 0.9, Retic Ct Abs at 32.4, Immature Retic Fract  at 6.7  On review of systems, pt reports night sweats, sleeplessness, fatigue and denies rash, new lumps/bumps, abdominal pain, leg swelling and any other symptoms.   REVIEW OF SYSTEMS:   A 10+ POINT REVIEW OF SYSTEMS WAS OBTAINED including neurology, dermatology, psychiatry, cardiac, respiratory, lymph, extremities, GI, GU, Musculoskeletal, constitutional, breasts, reproductive, HEENT.  All pertinent positives are noted in the HPI.  All others are negative.   . Past Medical History:  Diagnosis Date  . Allergy   . Arthritis   . Back pain   . Complication of anesthesia    bp dropped yrs ago, recent surgeries went ok  . Dysrhythmia    occ, no meds for  . GERD (gastroesophageal reflux disease)   . H. pylori infection   . Headache    migraines occ  . Hiatal hernia   . Hx of adenomatous colonic polyps 11/30/2017  . Hyperlipidemia   . Hypothyroidism 1970's   hx of years ago, no meds for   . Shortness of breath dyspnea    with exertion, pt told comes from acid reflux    . Past Surgical History:  Procedure Laterality Date  . APPENDECTOMY    . COLONOSCOPY  12/07/2013  . DILATION AND CURETTAGE OF UTERUS    . LAPAROSCOPIC CHOLECYSTECTOMY SINGLE SITE WITH INTRAOPERATIVE CHOLANGIOGRAM N/A 10/28/2015   Procedure: LAPAROSCOPIC CHOLECYSTECTOMY WITH INTRAOPERATIVE CHOLANGIOGRAM;  Surgeon: Armandina Gemma, MD;  Location: [facility];  Service: General;  Laterality: N/A;  . LAPAROSCOPY  yrs ago   x 2  . TMJ ARTHROSCOPY    .  TOTAL ABDOMINAL HYSTERECTOMY     complete    . Social History   Tobacco Use  . Smoking status: Never Smoker  . Smokeless tobacco: Never Used  Vaping Use  . Vaping Use: Never used  Substance Use Topics  . Alcohol use: Yes    Alcohol/week: 4.0 - 5.0 standard drinks    Types: 4 - 5 Cans of beer per week    Comment: 4-5 beers weekly-occ per pt  . Drug use: No    ALLERGIES:  has No Known Allergies.  MEDICATIONS:  Current Outpatient Medications  Medication Sig  Dispense Refill  . acyclovir (ZOVIRAX) 400 MG tablet TAKE 1 TABLET BY MOUTH TWICE A DAY 30 tablet 3  . ALPRAZolam (XANAX) 0.5 MG tablet Take 0.5 mg by mouth at bedtime as needed for anxiety.     . Cholecalciferol (VITAMIN D3) 5000 units CAPS Take 1 capsule by mouth daily.    . cyclobenzaprine (FLEXERIL) 10 MG tablet 10 mg.     . dexamethasone (DECADRON) 4 MG tablet Take 2 tablets (8 mg total) by mouth daily. Start 3 days before and continue 2 days after bendamustine/Rituxan chemotherapy for 2 days. Take with food. 30 tablet 1  . diclofenac Sodium (VOLTAREN) 1 % GEL Apply 1 application topically 4 (four) times daily.    . fluticasone (FLONASE) 50 MCG/ACT nasal spray Place into both nostrils.    Brooke [name] HYDROcodone-acetaminophen (NORCO/VICODIN) 5-325 MG tablet Take 1-2 tablets by mouth every 4 (four) hours as needed for moderate pain. 20 tablet 0  . ibuprofen (ADVIL,MOTRIN) 200 MG tablet Take 400 mg by mouth every 6 (six) hours as needed.    Brooke [name] LORazepam (ATIVAN) 0.5 MG tablet Take 1 tablet (0.5 mg total) by mouth every 6 (six) hours as needed (Nausea or vomiting). 30 tablet 0  . omeprazole (PRILOSEC) 20 MG capsule Take 20 mg by mouth every morning.    . ondansetron (ZOFRAN) 8 MG tablet Take 1 tablet (8 mg total) by mouth 2 (two) times daily as needed for refractory nausea / vomiting. Start on day 2 after bendamustine chemo. 30 tablet 1  . oxymetazoline (AFRIN) 0.05 % nasal spray Place 1 spray into both nostrils as needed for congestion.    . pantoprazole (PROTONIX) 40 MG tablet Take 1 tablet (40 mg total) by mouth every morning. Before breakfast (Patient taking differently: Take 40 mg by mouth every evening. Before breakfast) 90 tablet 1  . predniSONE (DELTASONE) 20 MG tablet Take 1 tablet (20 mg total) by mouth daily with breakfast. 5 tablet 0  . prochlorperazine (COMPAZINE) 10 MG tablet Take 1 tablet (10 mg total) by mouth every 6 (six) hours as needed (Nausea or vomiting). 30 tablet 1  . promethazine  (PHENERGAN) 25 MG tablet Take 1 tablet (25 mg total) by mouth every 6 (six) hours as needed for nausea or vomiting. 10 tablet 0  . rosuvastatin (CRESTOR) 5 MG tablet Take 5 mg by mouth daily.     No current facility-administered medications for this visit.    PHYSICAL EXAMINATION:  GENERAL:alert, in no acute distress and comfortable SKIN: no acute rashes, no significant lesions EYES: conjunctiva are pink and non-injected, sclera anicteric OROPHARYNX: MMM, no exudates, no oropharyngeal erythema or ulceration NECK: supple, no JVD LYMPH:  no palpable lymphadenopathy in the cervical, axillary or inguinal regions LUNGS: clear to auscultation b/l with normal respiratory effort HEART: regular rate & rhythm ABDOMEN:  normoactive bowel sounds , non tender, not distended. No palpable hepatosplenomegaly.  Extremity: no  pedal edema PSYCH: alert & oriented x 3 with fluent speech NEURO: no focal motor/sensory deficits  LABORATORY DATA:   I have reviewed the data as listed  . CBC Latest Ref Rng & Units 12/12/2019 11/23/2019 11/14/2019  WBC 4.0 - 10.5 K/uL 6.3 7.6 9.1  Hemoglobin 12.0 - 15.0 g/dL 11.4(L) 11.3(L) 11.4(L)  Hematocrit 36 - 46 % 34.0(L) 34.4(L) 33.8(L)  Platelets 150 - 400 K/uL 215 218 171    . CMP Latest Ref Rng & Units 12/12/2019 11/23/2019 11/14/2019  Glucose 70 - 99 mg/dL 97 99 86  BUN 8 - 23 mg/dL 19 10 13   Creatinine 0.44 - 1.00 mg/dL 0.77 0.79 0.77  Sodium 135 - 145 mmol/L 139 142 142  Potassium 3.5 - 5.1 mmol/L 3.8 3.9 3.5  Chloride 98 - 111 mmol/L 105 104 109  CO2 22 - 32 mmol/L 25 30 27   Calcium 8.9 - 10.3 mg/dL 9.3 9.5 9.7  Total Protein 6.5 - 8.1 g/dL 7.0 5.9(L) 6.8  Total Bilirubin 0.3 - 1.2 mg/dL 0.5 0.7 0.9  Alkaline Phos 38 - 126 U/L 62 56 74  AST 15 - 41 U/L 28 13(L) 16  ALT 0 - 44 U/L 26 11 10      RADIOGRAPHIC STUDIES: I have personally reviewed the radiological images as listed and agreed with the findings in the report. No results found.  ASSESSMENT  & PLAN:   74 yo with   1) Stage III/IV Mantle cell lymphoma 2) Allergic reaction to Rapid Rituxan. - rash and low grade fever - nearly resolved with solumedrol/tylenol, benadryl and famotidine today PLAN: -Discussed pt labwork today, 12/12/19; blood counts and chemistries look good, Reticulocytes are WNL -The pt has no prohibitive toxicities from continuing C3D1 of BR at this time. Will add Singular to premeds and discontinue rapid Rituxan.  -Recommended that the pt continue to eat well, drink at least 48-64 oz of water each day, and walk 20-30 minutes each day.   -Discussed CDC guidelines regarding the COVID19 booster. Recommend pt receive in 3 weeks.  -Recommend pt receive the annual flu vaccine after next treatment.  -Continue 8 mg Dexamethasone 3 days prior to and for 2-3 days after each treatment. -Continue Claritin nightly.   FOLLOW UP: PLz schedule C4 of BR (D1 and D2) . Labs and MD on C4D1   The total time spent in the appt was 30 minutes and more than 50% was on counseling and direct patient cares, ordering and management of chemotherapy  All of the patient's questions were answered with apparent satisfaction. The patient knows to call the clinic with any problems, questions or concerns.   Sullivan Lone MD [place] AAHIVMS [facility] Hematology/Oncology Physician [facility]  (Office):       [phone] (Work cell):  [phone] (Fax):           [phone]  I, Yevette Edwards, am acting as a scribe for Dr. Sullivan Lone.   .I have reviewed the above documentation for accuracy and completeness, and I agree with the above. Brunetta Genera MD

## 2019-12-12 ENCOUNTER — Other Ambulatory Visit: Payer: Self-pay

## 2019-12-12 ENCOUNTER — Inpatient Hospital Stay: Payer: Medicare HMO | Admitting: Hematology

## 2019-12-12 ENCOUNTER — Inpatient Hospital Stay: Payer: Medicare HMO

## 2019-12-12 ENCOUNTER — Inpatient Hospital Stay: Payer: Medicare HMO | Attending: Hematology

## 2019-12-12 VITALS — BP 139/83 | HR 70 | Temp 97.2°F | Resp 16 | Ht 63.0 in | Wt 118.2 lb

## 2019-12-12 VITALS — BP 132/72 | HR 64 | Temp 98.1°F | Resp 18

## 2019-12-12 DIAGNOSIS — C8318 Mantle cell lymphoma, lymph nodes of multiple sites: Secondary | ICD-10-CM

## 2019-12-12 DIAGNOSIS — Z7189 Other specified counseling: Secondary | ICD-10-CM

## 2019-12-12 DIAGNOSIS — Z5111 Encounter for antineoplastic chemotherapy: Secondary | ICD-10-CM

## 2019-12-12 DIAGNOSIS — D701 Agranulocytosis secondary to cancer chemotherapy: Secondary | ICD-10-CM | POA: Insufficient documentation

## 2019-12-12 DIAGNOSIS — C8594 Non-Hodgkin lymphoma, unspecified, lymph nodes of axilla and upper limb: Secondary | ICD-10-CM | POA: Diagnosis present

## 2019-12-12 DIAGNOSIS — Z5112 Encounter for antineoplastic immunotherapy: Secondary | ICD-10-CM | POA: Insufficient documentation

## 2019-12-12 LAB — CBC WITH DIFFERENTIAL/PLATELET
Abs Immature Granulocytes: 0.4 10*3/uL — ABNORMAL HIGH (ref 0.00–0.07)
Basophils Absolute: 0 10*3/uL (ref 0.0–0.1)
Basophils Relative: 0 %
Eosinophils Absolute: 0 10*3/uL (ref 0.0–0.5)
Eosinophils Relative: 0 %
HCT: 34 % — ABNORMAL LOW (ref 36.0–46.0)
Hemoglobin: 11.4 g/dL — ABNORMAL LOW (ref 12.0–15.0)
Immature Granulocytes: 6 %
Lymphocytes Relative: 6 %
Lymphs Abs: 0.4 10*3/uL — ABNORMAL LOW (ref 0.7–4.0)
MCH: 31.1 pg (ref 26.0–34.0)
MCHC: 33.5 g/dL (ref 30.0–36.0)
MCV: 92.9 fL (ref 80.0–100.0)
Monocytes Absolute: 1.2 10*3/uL — ABNORMAL HIGH (ref 0.1–1.0)
Monocytes Relative: 19 %
Neutro Abs: 4.3 10*3/uL (ref 1.7–7.7)
Neutrophils Relative %: 69 %
Platelets: 215 10*3/uL (ref 150–400)
RBC: 3.66 MIL/uL — ABNORMAL LOW (ref 3.87–5.11)
RDW: 13.8 % (ref 11.5–15.5)
WBC: 6.3 10*3/uL (ref 4.0–10.5)
nRBC: 0 % (ref 0.0–0.2)

## 2019-12-12 LAB — CMP (CANCER CENTER ONLY)
ALT: 26 U/L (ref 0–44)
AST: 28 U/L (ref 15–41)
Albumin: 3.8 g/dL (ref 3.5–5.0)
Alkaline Phosphatase: 62 U/L (ref 38–126)
Anion gap: 9 (ref 5–15)
BUN: 19 mg/dL (ref 8–23)
CO2: 25 mmol/L (ref 22–32)
Calcium: 9.3 mg/dL (ref 8.9–10.3)
Chloride: 105 mmol/L (ref 98–111)
Creatinine: 0.77 mg/dL (ref 0.44–1.00)
GFR, Estimated: >60 mL/min (ref >60)
Glucose, Bld: 97 mg/dL (ref 70–99)
Potassium: 3.8 mmol/L (ref 3.5–5.1)
Sodium: 139 mmol/L (ref 135–145)
Total Bilirubin: 0.5 mg/dL (ref 0.3–1.2)
Total Protein: 7 g/dL (ref 6.5–8.1)

## 2019-12-12 LAB — RETICULOCYTES
Immature Retic Fract: 6.7 % (ref 2.3–15.9)
RBC.: 3.64 MIL/uL — ABNORMAL LOW (ref 3.87–5.11)
Retic Count, Absolute: 32.4 10*3/uL (ref 19.0–186.0)
Retic Ct Pct: 0.9 % (ref 0.4–3.1)

## 2019-12-12 MED ORDER — DIPHENHYDRAMINE HCL 25 MG PO CAPS
50.0000 mg | ORAL_CAPSULE | Freq: Once | ORAL | Status: AC
  Administered 2019-12-12: 50 mg via ORAL

## 2019-12-12 MED ORDER — SODIUM CHLORIDE 0.9 % IV SOLN
375.0000 mg/m2 | Freq: Once | INTRAVENOUS | Status: AC
  Administered 2019-12-12: 600 mg via INTRAVENOUS
  Filled 2019-12-12: qty 50

## 2019-12-12 MED ORDER — SODIUM CHLORIDE 0.9 % IV SOLN
90.0000 mg/m2 | Freq: Once | INTRAVENOUS | Status: AC
  Administered 2019-12-12: 150 mg via INTRAVENOUS
  Filled 2019-12-12: qty 6

## 2019-12-12 MED ORDER — PALONOSETRON HCL INJECTION 0.25 MG/5ML
0.2500 mg | Freq: Once | INTRAVENOUS | Status: AC
  Administered 2019-12-12: 0.25 mg via INTRAVENOUS

## 2019-12-12 MED ORDER — ACETAMINOPHEN 325 MG PO TABS
ORAL_TABLET | ORAL | Status: AC
  Filled 2019-12-12: qty 2

## 2019-12-12 MED ORDER — FAMOTIDINE IN NACL 20-0.9 MG/50ML-% IV SOLN
INTRAVENOUS | Status: AC
  Filled 2019-12-12: qty 50

## 2019-12-12 MED ORDER — SODIUM CHLORIDE 0.9 % IV SOLN
10.0000 mg | Freq: Once | INTRAVENOUS | Status: AC
  Administered 2019-12-12: 10 mg via INTRAVENOUS
  Filled 2019-12-12: qty 10

## 2019-12-12 MED ORDER — MONTELUKAST SODIUM 10 MG PO TABS
ORAL_TABLET | ORAL | Status: AC
  Filled 2019-12-12: qty 1

## 2019-12-12 MED ORDER — ACETAMINOPHEN 325 MG PO TABS
650.0000 mg | ORAL_TABLET | Freq: Once | ORAL | Status: AC
  Administered 2019-12-12: 650 mg via ORAL

## 2019-12-12 MED ORDER — MONTELUKAST SODIUM 10 MG PO TABS
10.0000 mg | ORAL_TABLET | Freq: Once | ORAL | Status: AC
  Administered 2019-12-12: 10 mg via ORAL

## 2019-12-12 MED ORDER — FAMOTIDINE IN NACL 20-0.9 MG/50ML-% IV SOLN
20.0000 mg | Freq: Once | INTRAVENOUS | Status: AC
  Administered 2019-12-12: 20 mg via INTRAVENOUS

## 2019-12-12 MED ORDER — PALONOSETRON HCL INJECTION 0.25 MG/5ML
INTRAVENOUS | Status: AC
  Filled 2019-12-12: qty 5

## 2019-12-12 MED ORDER — SODIUM CHLORIDE 0.9 % IV SOLN
Freq: Once | INTRAVENOUS | Status: AC
  Filled 2019-12-12: qty 250

## 2019-12-12 MED ORDER — DIPHENHYDRAMINE HCL 25 MG PO CAPS
ORAL_CAPSULE | ORAL | Status: AC
  Filled 2019-12-12: qty 2

## 2019-12-12 NOTE — Progress Notes (Signed)
VO from desk RN to change pt back to regular infusion of Rituxan.  Pt had rxn with C2 rapid.  MD has changed premeds according to message sent in secure chat

## 2019-12-12 NOTE — Patient Instructions (Signed)
[facility] Discharge Instructions for Patients Receiving Chemotherapy  Today you received the following chemotherapy agents: rituximab/bendamustine  To help prevent nausea and vomiting after your treatment, we encourage you to take your nausea medication as directed.   If you develop nausea and vomiting that is not controlled by your nausea medication, call the clinic.   BELOW ARE SYMPTOMS THAT SHOULD BE REPORTED IMMEDIATELY:  *FEVER GREATER THAN 100.5 F  *CHILLS WITH OR WITHOUT FEVER  NAUSEA AND VOMITING THAT IS NOT CONTROLLED WITH YOUR NAUSEA MEDICATION  *UNUSUAL SHORTNESS OF BREATH  *UNUSUAL BRUISING OR BLEEDING  TENDERNESS IN MOUTH AND THROAT WITH OR WITHOUT PRESENCE OF ULCERS  *URINARY PROBLEMS  *BOWEL PROBLEMS  UNUSUAL RASH Items with * indicate a potential emergency and should be followed up as soon as possible.  Feel free to call the clinic should you have any questions or concerns. The clinic phone number is [phone].  Please show the CHEMO ALERT CARD at check-in to the Emergency Department and triage nurse.   

## 2019-12-13 ENCOUNTER — Other Ambulatory Visit: Payer: Self-pay | Admitting: Hematology

## 2019-12-13 ENCOUNTER — Other Ambulatory Visit: Payer: Self-pay

## 2019-12-13 ENCOUNTER — Inpatient Hospital Stay: Payer: Medicare HMO

## 2019-12-13 VITALS — BP 131/75 | HR 68 | Temp 97.9°F | Resp 18

## 2019-12-13 DIAGNOSIS — Z7189 Other specified counseling: Secondary | ICD-10-CM

## 2019-12-13 DIAGNOSIS — C8318 Mantle cell lymphoma, lymph nodes of multiple sites: Secondary | ICD-10-CM

## 2019-12-13 DIAGNOSIS — Z5112 Encounter for antineoplastic immunotherapy: Secondary | ICD-10-CM | POA: Diagnosis not present

## 2019-12-13 MED ORDER — SODIUM CHLORIDE 0.9 % IV SOLN
Freq: Once | INTRAVENOUS | Status: AC
  Filled 2019-12-13: qty 250

## 2019-12-13 MED ORDER — MONTELUKAST SODIUM 10 MG PO TABS
10.0000 mg | ORAL_TABLET | Freq: Once | ORAL | Status: AC
  Administered 2019-12-13: 10 mg via ORAL

## 2019-12-13 MED ORDER — SODIUM CHLORIDE 0.9 % IV SOLN
10.0000 mg | Freq: Once | INTRAVENOUS | Status: AC
  Administered 2019-12-13: 10 mg via INTRAVENOUS
  Filled 2019-12-13: qty 10

## 2019-12-13 MED ORDER — MONTELUKAST SODIUM 10 MG PO TABS
ORAL_TABLET | ORAL | Status: AC
  Filled 2019-12-13: qty 1

## 2019-12-13 MED ORDER — SODIUM CHLORIDE 0.9 % IV SOLN
90.0000 mg/m2 | Freq: Once | INTRAVENOUS | Status: AC
  Administered 2019-12-13: 150 mg via INTRAVENOUS
  Filled 2019-12-13: qty 6

## 2019-12-13 NOTE — Patient Instructions (Signed)
[facility] Discharge Instructions for Patients Receiving Chemotherapy  Today you received the following chemotherapy agents Bendamustine [facility]).  To help prevent nausea and vomiting after your treatment, we encourage you to take your nausea medication as prescribed.   If you develop nausea and vomiting that is not controlled by your nausea medication, call the clinic.   BELOW ARE SYMPTOMS THAT SHOULD BE REPORTED IMMEDIATELY:  *FEVER GREATER THAN 100.5 F  *CHILLS WITH OR WITHOUT FEVER  NAUSEA AND VOMITING THAT IS NOT CONTROLLED WITH YOUR NAUSEA MEDICATION  *UNUSUAL SHORTNESS OF BREATH  *UNUSUAL BRUISING OR BLEEDING  TENDERNESS IN MOUTH AND THROAT WITH OR WITHOUT PRESENCE OF ULCERS  *URINARY PROBLEMS  *BOWEL PROBLEMS  UNUSUAL RASH Items with * indicate a potential emergency and should be followed up as soon as possible.  Feel free to call the clinic should you have any questions or concerns. The clinic phone number is [phone].  Please show the [place] at check-in to the Emergency Department and triage nurse.

## 2019-12-20 ENCOUNTER — Encounter: Payer: Self-pay | Admitting: Hematology

## 2019-12-21 ENCOUNTER — Telehealth: Payer: Self-pay | Admitting: Hematology

## 2019-12-21 NOTE — Telephone Encounter (Signed)
Called pt per 11/9 sch msg - unable to add pt in for infusion on 12/1 - waiting to see when we can add infusion in. Pt is aware that we are working on her schedule and she is aware that we may send her to [place] location if needed.

## 2019-12-25 ENCOUNTER — Telehealth: Payer: Self-pay | Admitting: *Deleted

## 2019-12-25 ENCOUNTER — Other Ambulatory Visit: Payer: Self-pay | Admitting: Hematology

## 2019-12-25 NOTE — Telephone Encounter (Signed)
Patient called - states achy all over, night sweats, extreme fatigue and chills for past 5-7 days. Temp of 102 today. Dr. Irene Limbo informed. Per Dr. Irene Limbo - with these symptoms, patient should go to ED. Attempted to contact patient 3 times over 2 hours at home number with these directions, only heard busy signal.  Left voice mail on mobile phone at 1730 with Dr. Grier Mitts directions to go to ED tonight. Advised to take "Chemo Card" to give to staff.

## 2019-12-27 ENCOUNTER — Other Ambulatory Visit: Payer: Self-pay

## 2019-12-27 ENCOUNTER — Encounter (HOSPITAL_COMMUNITY): Payer: Self-pay

## 2019-12-27 ENCOUNTER — Emergency Department (HOSPITAL_COMMUNITY)
Admission: EM | Admit: 2019-12-27 | Discharge: 2019-12-27 | Disposition: A | Discharge status: Home / Self Care | Payer: Medicare HMO | Attending: Emergency Medicine | Admitting: Emergency Medicine

## 2019-12-27 ENCOUNTER — Telehealth: Payer: Self-pay | Admitting: *Deleted

## 2019-12-27 ENCOUNTER — Other Ambulatory Visit (HOSPITAL_COMMUNITY): Payer: Self-pay | Admitting: Emergency Medicine

## 2019-12-27 ENCOUNTER — Other Ambulatory Visit: Payer: Self-pay | Admitting: Hematology

## 2019-12-27 ENCOUNTER — Emergency Department (HOSPITAL_COMMUNITY): Payer: Medicare HMO

## 2019-12-27 DIAGNOSIS — E039 Hypothyroidism, unspecified: Secondary | ICD-10-CM | POA: Diagnosis not present

## 2019-12-27 DIAGNOSIS — D709 Neutropenia, unspecified: Secondary | ICD-10-CM | POA: Diagnosis not present

## 2019-12-27 DIAGNOSIS — R110 Nausea: Secondary | ICD-10-CM | POA: Insufficient documentation

## 2019-12-27 DIAGNOSIS — R531 Weakness: Secondary | ICD-10-CM | POA: Insufficient documentation

## 2019-12-27 DIAGNOSIS — Z7189 Other specified counseling: Secondary | ICD-10-CM

## 2019-12-27 DIAGNOSIS — R630 Anorexia: Secondary | ICD-10-CM | POA: Diagnosis not present

## 2019-12-27 DIAGNOSIS — Z20822 Contact with and (suspected) exposure to covid-19: Secondary | ICD-10-CM | POA: Insufficient documentation

## 2019-12-27 DIAGNOSIS — R5081 Fever presenting with conditions classified elsewhere: Secondary | ICD-10-CM

## 2019-12-27 DIAGNOSIS — R509 Fever, unspecified: Secondary | ICD-10-CM | POA: Diagnosis present

## 2019-12-27 DIAGNOSIS — C8318 Mantle cell lymphoma, lymph nodes of multiple sites: Secondary | ICD-10-CM

## 2019-12-27 HISTORY — DX: Malignant (primary) neoplasm, unspecified: C80.1

## 2019-12-27 LAB — CBC WITH DIFFERENTIAL/PLATELET
Abs Immature Granulocytes: 0 10*3/uL (ref 0.00–0.07)
Basophils Absolute: 0 10*3/uL (ref 0.0–0.1)
Basophils Relative: 2 %
Eosinophils Absolute: 0.2 10*3/uL (ref 0.0–0.5)
Eosinophils Relative: 18 %
HCT: 30.9 % — ABNORMAL LOW (ref 36.0–46.0)
Hemoglobin: 10.2 g/dL — ABNORMAL LOW (ref 12.0–15.0)
Lymphocytes Relative: 17 %
Lymphs Abs: 0.2 10*3/uL — ABNORMAL LOW (ref 0.7–4.0)
MCH: 31.5 pg (ref 26.0–34.0)
MCHC: 33 g/dL (ref 30.0–36.0)
MCV: 95.4 fL (ref 80.0–100.0)
Monocytes Absolute: 0.6 10*3/uL (ref 0.1–1.0)
Monocytes Relative: 43 %
Neutro Abs: 0.3 10*3/uL — CL (ref 1.7–7.7)
Neutrophils Relative %: 20 %
Platelets: 184 10*3/uL (ref 150–400)
RBC: 3.24 MIL/uL — ABNORMAL LOW (ref 3.87–5.11)
RDW: 13.8 % (ref 11.5–15.5)
WBC: 1.3 10*3/uL — CL (ref 4.0–10.5)
nRBC: 0 % (ref 0.0–0.2)

## 2019-12-27 LAB — COMPREHENSIVE METABOLIC PANEL
ALT: 16 U/L (ref 0–44)
AST: 19 U/L (ref 15–41)
Albumin: 3.6 g/dL (ref 3.5–5.0)
Alkaline Phosphatase: 61 U/L (ref 38–126)
Anion gap: 9 (ref 5–15)
BUN: 13 mg/dL (ref 8–23)
CO2: 26 mmol/L (ref 22–32)
Calcium: 9.1 mg/dL (ref 8.9–10.3)
Chloride: 102 mmol/L (ref 98–111)
Creatinine, Ser: 0.76 mg/dL (ref 0.44–1.00)
GFR, Estimated: >60 mL/min (ref >60)
Glucose, Bld: 89 mg/dL (ref 70–99)
Potassium: 3.3 mmol/L — ABNORMAL LOW (ref 3.5–5.1)
Sodium: 137 mmol/L (ref 135–145)
Total Bilirubin: 0.5 mg/dL (ref 0.3–1.2)
Total Protein: 6.9 g/dL (ref 6.5–8.1)

## 2019-12-27 LAB — URINALYSIS, ROUTINE W REFLEX MICROSCOPIC
Bilirubin Urine: NEGATIVE
Glucose, UA: NEGATIVE mg/dL
Hgb urine dipstick: NEGATIVE
Ketones, ur: NEGATIVE mg/dL
Leukocytes,Ua: NEGATIVE
Nitrite: NEGATIVE
Protein, ur: NEGATIVE mg/dL
Specific Gravity, Urine: 1.004 — ABNORMAL LOW (ref 1.005–1.030)
pH: 7 (ref 5.0–8.0)

## 2019-12-27 LAB — RESP PANEL BY RT-PCR (FLU A&B, COVID) ARPGX2
Influenza A by PCR: NEGATIVE
Influenza B by PCR: NEGATIVE
SARS Coronavirus 2 by RT PCR: NEGATIVE

## 2019-12-27 MED ORDER — LEVOFLOXACIN 750 MG PO TABS: 750.0000 mg | ORAL_TABLET | Freq: Every day | ORAL | 0 refills | Status: DC

## 2019-12-27 MED ORDER — ONDANSETRON HCL 4 MG/2ML IJ SOLN
4.0000 mg | Freq: Once | INTRAMUSCULAR | Status: AC
  Administered 2019-12-27: 4 mg via INTRAVENOUS
  Filled 2019-12-27: qty 2

## 2019-12-27 MED ORDER — SODIUM CHLORIDE 0.9 % IV BOLUS
1000.0000 mL | Freq: Once | INTRAVENOUS | Status: AC
  Administered 2019-12-27: 1000 mL via INTRAVENOUS

## 2019-12-27 MED ORDER — LEVOFLOXACIN IN D5W 750 MG/150ML IV SOLN
750.0000 mg | Freq: Once | INTRAVENOUS | Status: AC
  Administered 2019-12-27: 750 mg via INTRAVENOUS
  Filled 2019-12-27: qty 150

## 2019-12-27 NOTE — ED Triage Notes (Signed)
Patient is currently receiving chemo treatments for leukemia. Patient reports that she has been having fever, chills, and fatigue  X 4 days. Patient states she has been taking Tylenol for fever. Patient states her temp has been around 100.2 orally.  patient states she is having sores in the roof of her mouth and a tan coating on her tongue.

## 2019-12-27 NOTE — ED Provider Notes (Signed)
[facility] EMERGENCY DEPARTMENT Provider Note  CSN: [id number] Arrival date & time: 12/27/19 1647    History Chief Complaint  Patient presents with  . cancer patient  . Fever  . Fatigue    HPI  Brooke [name] is a 74 y.o. female with history of Mantle Cell lymphoma currently getting chemo therapy, she has had 2 cycles, last chemo was about 2 weeks ago. Since Sunday, 4 days ago, she has had intermittent fevers, chills, nausea and poor appetite. She gets night sweats if she takes APAP before bed. She reports general weakness as well. She called her Oncologist 2 days ago and was advised to come to the ED but she stayed home hoping it would get better. She has noticed a couple of sores on the roof of her mouth, but otherwise denies cough, congestion, vomiting, diarrhea, constipation, dysuria or rashes. She has had some fevers with prior chemo treatments but not this far out. She reports with previous cycles she was feeling better by this time and able to do her normal activities.    Past Medical History:  Diagnosis Date  . Allergy   . Arthritis   . Back pain   . Cancer ([place])   . Complication of anesthesia    bp dropped yrs ago, recent surgeries went ok  . Dysrhythmia    occ, no meds for  . GERD (gastroesophageal reflux disease)   . H. pylori infection   . Headache    migraines occ  . Hiatal hernia   . Hx of adenomatous colonic polyps 11/30/2017  . Hyperlipidemia   . Hypothyroidism 1970's   hx of years ago, no meds for   . Shortness of breath dyspnea    with exertion, pt told comes from acid reflux    Past Surgical History:  Procedure Laterality Date  . APPENDECTOMY    . COLONOSCOPY  12/07/2013  . DILATION AND CURETTAGE OF UTERUS    . LAPAROSCOPIC CHOLECYSTECTOMY SINGLE SITE WITH INTRAOPERATIVE CHOLANGIOGRAM N/A 10/28/2015   Procedure: LAPAROSCOPIC CHOLECYSTECTOMY WITH INTRAOPERATIVE CHOLANGIOGRAM;  Surgeon: Armandina Gemma, MD;  Location: [facility];  Service:  General;  Laterality: N/A;  . LAPAROSCOPY  yrs ago   x 2  . TMJ ARTHROSCOPY    . TOTAL ABDOMINAL HYSTERECTOMY     complete    Family History  Problem Relation Age of Onset  . Colon cancer Father        early 24's  . Ovarian cancer Sister   . Kidney cancer Brother   . Colon polyps Brother   . Colon cancer Paternal Grandfather        unknown age of onset  . Lung cancer Brother   . Colon polyps Sister   . Breast cancer Maternal Aunt   . Breast cancer Cousin   . Rectal cancer Neg Hx   . Stomach cancer Neg Hx   . Esophageal cancer Neg Hx     Social History   Tobacco Use  . Smoking status: Never Smoker  . Smokeless tobacco: Never Used  Vaping Use  . Vaping Use: Never used  Substance Use Topics  . Alcohol use: Yes    Alcohol/week: 4.0 - 5.0 standard drinks    Types: 4 - 5 Cans of beer per week    Comment: 4-5 beers weekly-occ per pt  . Drug use: No     Home Medications Prior to Admission medications   Medication Sig Start Date End Date Taking? Authorizing Provider  acyclovir (ZOVIRAX) 400  MG tablet TAKE 1 TABLET BY MOUTH TWICE A DAY 12/27/19   Brunetta Genera, MD  ALPRAZolam Duanne Moron) 0.5 MG tablet Take 0.5 mg by mouth at bedtime as needed for anxiety.  04/21/12   [provider]  Cholecalciferol (VITAMIN D3) 5000 units CAPS Take 1 capsule by mouth daily.    [provider]  cyclobenzaprine (FLEXERIL) 10 MG tablet 10 mg.     [provider]  dexamethasone (DECADRON) 4 MG tablet Take 2 tablets (8 mg total) by mouth daily. Start 3 days before and continue 2 days after bendamustine/Rituxan chemotherapy for 2 days. Take with food. 11/21/19   Brunetta Genera, MD  diclofenac Sodium (VOLTAREN) 1 % GEL Apply 1 application topically 4 (four) times daily. 07/23/19   [provider]  fluticasone (FLONASE) 50 MCG/ACT nasal spray Place into both nostrils. 09/17/19   [provider]  HYDROcodone-acetaminophen (NORCO/VICODIN) 5-325 MG  tablet Take 1-2 tablets by mouth every 4 (four) hours as needed for moderate pain. 10/28/15   Armandina Gemma, MD  ibuprofen (ADVIL,MOTRIN) 200 MG tablet Take 400 mg by mouth every 6 (six) hours as needed.    [provider]  levofloxacin (LEVAQUIN) 750 MG tablet Take 1 tablet (750 mg total) by mouth daily for 10 days. 12/27/19 01/06/20  Truddie Hidden, MD  LORazepam (ATIVAN) 0.5 MG tablet Take 1 tablet (0.5 mg total) by mouth every 6 (six) hours as needed (Nausea or vomiting). 10/08/19   Brunetta Genera, MD  omeprazole (PRILOSEC) 20 MG capsule Take 20 mg by mouth every morning. 09/25/19   [provider]  ondansetron (ZOFRAN) 8 MG tablet Take 1 tablet (8 mg total) by mouth 2 (two) times daily as needed for refractory nausea / vomiting. Start on day 2 after bendamustine chemo. 10/08/19   Brunetta Genera, MD  oxymetazoline (AFRIN) 0.05 % nasal spray Place 1 spray into both nostrils as needed for congestion.    [provider]  pantoprazole (PROTONIX) 40 MG tablet Take 1 tablet (40 mg total) by mouth every morning. Before breakfast Patient taking differently: Take 40 mg by mouth every evening. Before breakfast 08/19/15   Pyrtle, Lajuan Lines, MD  predniSONE (DELTASONE) 20 MG tablet Take 1 tablet (20 mg total) by mouth daily with breakfast. 11/15/19   Brunetta Genera, MD  prochlorperazine (COMPAZINE) 10 MG tablet Take 1 tablet (10 mg total) by mouth every 6 (six) hours as needed (Nausea or vomiting). 10/08/19   Brunetta Genera, MD  promethazine (PHENERGAN) 25 MG tablet Take 1 tablet (25 mg total) by mouth every 6 (six) hours as needed for nausea or vomiting. 10/29/15   Armandina Gemma, MD  rosuvastatin (CRESTOR) 5 MG tablet Take 5 mg by mouth daily.    [provider]     Allergies    Patient has no known allergies.   Review of Systems   Review of Systems A comprehensive review of systems was completed and negative except as noted in HPI.    Physical  Exam BP 134/84   Pulse 82   Temp 98.5 F (36.9 C) (Oral)   Resp 19   Ht 5\' 3"  (1.6 m)   Wt 53.6 kg   SpO2 98%   BMI 20.94 kg/m   Physical Exam Vitals and nursing note reviewed.  Constitutional:      Appearance: Normal appearance.  HENT:     Head: Normocephalic and atraumatic.     Nose: Nose normal.     Mouth/Throat:  Mouth: Mucous membranes are moist.  Eyes:     Extraocular Movements: Extraocular movements intact.     Conjunctiva/sclera: Conjunctivae normal.  Cardiovascular:     Rate and Rhythm: Normal rate.  Pulmonary:     Effort: Pulmonary effort is normal.     Breath sounds: Normal breath sounds.  Abdominal:     General: Abdomen is flat.     Palpations: Abdomen is soft.     Tenderness: There is no abdominal tenderness.  Musculoskeletal:        General: No swelling. Normal range of motion.     Cervical back: Neck supple.  Skin:    General: Skin is warm and dry.  Neurological:     General: No focal deficit present.     Mental Status: She is alert.  Psychiatric:        Mood and Affect: Mood normal.      ED Results / Procedures / Treatments   Labs (all labs ordered are listed, but only abnormal results are displayed) Labs Reviewed  COMPREHENSIVE METABOLIC PANEL - Abnormal; Notable for the following components:      Result Value   Potassium 3.3 (*)    All other components within normal limits  CBC WITH DIFFERENTIAL/PLATELET - Abnormal; Notable for the following components:   WBC 1.3 (*)    RBC 3.24 (*)    Hemoglobin 10.2 (*)    HCT 30.9 (*)    Neutro Abs 0.3 (*)    Lymphs Abs 0.2 (*)    All other components within normal limits  URINALYSIS, ROUTINE W REFLEX MICROSCOPIC - Abnormal; Notable for the following components:   Color, Urine STRAW (*)    Specific Gravity, Urine 1.004 (*)    All other components within normal limits  RESP PANEL BY RT-PCR (FLU A&B, COVID) ARPGX2  CULTURE, BLOOD (ROUTINE X 2)  CULTURE, BLOOD (ROUTINE X 2)     EKG None  Radiology DG Chest 2 View  Result Date: 12/27/2019 CLINICAL DATA:  74 year old female with fever. EXAM: CHEST - 2 VIEW COMPARISON:  Chest radiograph dated 06/08/2017. FINDINGS: There is diffuse chronic interstitial coarsening and bronchitic changes. No focal consolidation, pleural effusion, pneumothorax. The cardiac silhouette is within limits. There is osteopenia with degenerative changes of the spine. Multiple old thoracic compression fractures. No acute osseous pathology. IMPRESSION: No acute cardiopulmonary process. Electronically Signed   By: Anner Crete M.D.   On: 12/27/2019 18:43    Procedures Procedures  Medications Ordered in the ED Medications  levofloxacin (LEVAQUIN) IVPB 750 mg (has no administration in time range)  sodium chloride 0.9 % bolus 1,000 mL (1,000 mLs Intravenous New Bag/Given 12/27/19 1838)  ondansetron (ZOFRAN) injection 4 mg (4 mg Intravenous Given 12/27/19 1835)     MDM Rules/Calculators/A&P MDM Patient with fevers, night sweats, nausea and weakness about 2 weeks after cycle 3 of chemo for Mantle Cell lymphoma. She has not had neutropenia before. Will check labs and CXR to evaluate a source.  ED Course  I have reviewed the triage vital signs and the nursing notes.  Pertinent labs & imaging results that were available during my care of the patient were reviewed by me and considered in my medical decision making (see chart for details).  Clinical Course as of Dec 26 2100  Thu Dec 27, 2019  1928 CBC with leukopenia, awaiting differential   [CS]  1943 CMP unremarkable.    [CS]  1943 CXR is clear   [CS]  1959 Covid is negative.    [  CS]  2009 Neutropenia confirmed. Will discuss with Oncology preferred Abx and disposition.    [CS]  2047 Spoke with Dr. Benay Spice, on call for Oncology. Given no fever here and hemodynamically stable, he recommend outpatient treatment with Levaquin and close follow up in Dr. Grier Mitts office tomorrow.     [CS]    Clinical Course User Index [CS] Truddie Hidden, MD    Final Clinical Impression(s) / ED Diagnoses Final diagnoses:  Neutropenic fever [facility])    Rx / DC Orders ED Discharge Orders         Ordered    levofloxacin (LEVAQUIN) 750 MG tablet  Daily        12/27/19 2102           Truddie Hidden, MD 12/27/19 2102

## 2019-12-27 NOTE — ED Notes (Signed)
Date and time results received: 12/27/19 7:23 PM   Test: WBC Critical Value: 1.3  Name of Provider Notified: [place] Cantor  Orders Received? Or Actions Taken?: no new orders at this time

## 2019-12-27 NOTE — ED Notes (Signed)
Pt ambulatory to bathroom with little assistance. Specimen cup provided. Nonskid socks on feet.

## 2019-12-27 NOTE — Telephone Encounter (Signed)
Patient and daughter called office this morning.  States still having fever chills and night sweats. States symptoms decrease during day, but at night, symptoms return. States she did not go to ED as advised on Tuesday evening message as she hoped to feel better. Advised her that per Dr. Grier Mitts directions, she still needs to be evaluated. Instructed her to take her blue Chemo Card with her and give it to ED staff.  She said she will try to go to ED later today if she can get a ride. Called daughter and LVM with same information.

## 2019-12-27 NOTE — ED Notes (Signed)
Discharge instructions discussed with patient. Verbalized understanding. Departs ED at this time in stable condition.  

## 2019-12-27 NOTE — ED Notes (Addendum)
Date and time results received: 12/27/19 2001 (use smartphrase ".now" to insert current time)  Test: absolute neutrophil Critical Value: 0.3  Name of Provider Notified: Karle Starch MD  Orders Received? Or Actions Taken?: no new orders

## 2019-12-28 ENCOUNTER — Telehealth: Payer: Self-pay | Admitting: *Deleted

## 2019-12-28 ENCOUNTER — Ambulatory Visit: Payer: Medicare HMO

## 2019-12-28 ENCOUNTER — Other Ambulatory Visit: Payer: Self-pay | Admitting: Hematology

## 2019-12-28 ENCOUNTER — Inpatient Hospital Stay: Payer: Medicare HMO

## 2019-12-28 ENCOUNTER — Telehealth: Payer: Self-pay

## 2019-12-28 ENCOUNTER — Other Ambulatory Visit: Payer: Self-pay

## 2019-12-28 ENCOUNTER — Telehealth: Payer: Self-pay | Admitting: Hematology

## 2019-12-28 ENCOUNTER — Inpatient Hospital Stay: Payer: Medicare HMO | Admitting: Medical

## 2019-12-28 VITALS — BP 98/69 | HR 100 | Temp 98.3°F | Resp 16 | Ht 63.0 in | Wt 115.2 lb

## 2019-12-28 DIAGNOSIS — D701 Agranulocytosis secondary to cancer chemotherapy: Secondary | ICD-10-CM

## 2019-12-28 DIAGNOSIS — C8318 Mantle cell lymphoma, lymph nodes of multiple sites: Secondary | ICD-10-CM

## 2019-12-28 DIAGNOSIS — T451X5A Adverse effect of antineoplastic and immunosuppressive drugs, initial encounter: Secondary | ICD-10-CM

## 2019-12-28 DIAGNOSIS — D702 Other drug-induced agranulocytosis: Secondary | ICD-10-CM

## 2019-12-28 DIAGNOSIS — Z5112 Encounter for antineoplastic immunotherapy: Secondary | ICD-10-CM | POA: Diagnosis not present

## 2019-12-28 HISTORY — DX: Other drug-induced agranulocytosis: D70.2

## 2019-12-28 LAB — CBC WITH DIFFERENTIAL (CANCER CENTER ONLY)
Abs Immature Granulocytes: 0 10*3/uL (ref 0.00–0.07)
Basophils Absolute: 0 10*3/uL (ref 0.0–0.1)
Basophils Relative: 1 %
Eosinophils Absolute: 0.2 10*3/uL (ref 0.0–0.5)
Eosinophils Relative: 18 %
HCT: 29.7 % — ABNORMAL LOW (ref 36.0–46.0)
Hemoglobin: 10.1 g/dL — ABNORMAL LOW (ref 12.0–15.0)
Immature Granulocytes: 0 %
Lymphocytes Relative: 13 %
Lymphs Abs: 0.2 10*3/uL — ABNORMAL LOW (ref 0.7–4.0)
MCH: 31.4 pg (ref 26.0–34.0)
MCHC: 34 g/dL (ref 30.0–36.0)
MCV: 92.2 fL (ref 80.0–100.0)
Monocytes Absolute: 0.7 10*3/uL (ref 0.1–1.0)
Monocytes Relative: 51 %
Neutro Abs: 0.2 10*3/uL — CL (ref 1.7–7.7)
Neutrophils Relative %: 17 %
Platelet Count: 169 10*3/uL (ref 150–400)
RBC: 3.22 MIL/uL — ABNORMAL LOW (ref 3.87–5.11)
RDW: 13.9 % (ref 11.5–15.5)
WBC Count: 1.2 10*3/uL — ABNORMAL LOW (ref 4.0–10.5)
nRBC: 0 % (ref 0.0–0.2)

## 2019-12-28 LAB — CMP (CANCER CENTER ONLY)
ALT: 9 U/L (ref 0–44)
AST: 15 U/L (ref 15–41)
Albumin: 3.1 g/dL — ABNORMAL LOW (ref 3.5–5.0)
Alkaline Phosphatase: 65 U/L (ref 38–126)
Anion gap: 8 (ref 5–15)
BUN: 9 mg/dL (ref 8–23)
CO2: 26 mmol/L (ref 22–32)
Calcium: 9.2 mg/dL (ref 8.9–10.3)
Chloride: 105 mmol/L (ref 98–111)
Creatinine: 0.79 mg/dL (ref 0.44–1.00)
GFR, Estimated: >60 mL/min (ref >60)
Glucose, Bld: 88 mg/dL (ref 70–99)
Potassium: 3.6 mmol/L (ref 3.5–5.1)
Sodium: 139 mmol/L (ref 135–145)
Total Bilirubin: 0.4 mg/dL (ref 0.3–1.2)
Total Protein: 6.4 g/dL — ABNORMAL LOW (ref 6.5–8.1)

## 2019-12-28 MED ORDER — FILGRASTIM-SNDZ 480 MCG/0.8ML IJ SOSY
480.0000 ug | PREFILLED_SYRINGE | Freq: Once | INTRAMUSCULAR | Status: AC
  Administered 2019-12-28: 480 ug via SUBCUTANEOUS

## 2019-12-28 MED ORDER — FILGRASTIM-SNDZ 480 MCG/0.8ML IJ SOSY: 480.0000 ug | PREFILLED_SYRINGE | Freq: Once | INTRAMUSCULAR | Status: DC

## 2019-12-28 MED ORDER — FILGRASTIM-SNDZ 480 MCG/0.8ML IJ SOSY
PREFILLED_SYRINGE | INTRAMUSCULAR | Status: AC
  Filled 2019-12-28: qty 0.8

## 2019-12-28 MED ORDER — AMOXICILLIN-POT CLAVULANATE 875-125 MG PO TABS: 1.0000 | ORAL_TABLET | Freq: Two times a day (BID) | ORAL | 0 refills | Status: DC

## 2019-12-28 MED FILL — levoFLOXacin 750 MG TABS: 750 | 10 days supply | Qty: 10 | Fill #0

## 2019-12-28 MED FILL — AMOX-CLAV 875-125 MG TABLET: 875-125 | 5 days supply | Qty: 10 | Fill #0

## 2019-12-28 NOTE — Patient Instructions (Signed)

## 2019-12-28 NOTE — Telephone Encounter (Signed)
Scheduled per 11/19 sch msg. Called and spoke with pt, confirmed 11/19 appt

## 2019-12-28 NOTE — Telephone Encounter (Signed)
CRITICAL VALUE STICKER  CRITICAL VALUE: Absolute Neutrophils = 0.2  RECEIVER (on-site recipient of call): Yetta Glassman, CMA  DATE & TIME NOTIFIED: 12/28/19 at 3:45pm  MESSENGER (representative from lab): Hillary  MD NOTIFIED: Sandi Mealy, PA-C  TIME OF NOTIFICATION: 3:50pm  RESPONSE: Notification given to LPN for follow-up with provider.

## 2019-12-28 NOTE — Telephone Encounter (Signed)
Patient sister called - LVM: patient seen in ED last night. ED informed them that she needed to call Dr. Irene Limbo and be seen in office today.  Dr. Irene Limbo informed. Per Dr. Irene Limbo - high priority schedule message sent to scheduled patient in [facility] today and he will also see her. Contacted patient with this information and she verbalized understanding.

## 2019-12-28 NOTE — Progress Notes (Signed)
The following biosimilar Zarxio (filgrastim-sndz) has been selected for use in this patient.  Kennith Center, Pharm.D., CPP 12/28/2019@4 :04 PM

## 2019-12-29 ENCOUNTER — Inpatient Hospital Stay: Payer: Medicare HMO

## 2019-12-29 VITALS — BP 101/72 | HR 84 | Temp 98.4°F | Resp 18

## 2019-12-29 DIAGNOSIS — D701 Agranulocytosis secondary to cancer chemotherapy: Secondary | ICD-10-CM

## 2019-12-29 DIAGNOSIS — T451X5A Adverse effect of antineoplastic and immunosuppressive drugs, initial encounter: Secondary | ICD-10-CM

## 2019-12-29 DIAGNOSIS — C8318 Mantle cell lymphoma, lymph nodes of multiple sites: Secondary | ICD-10-CM

## 2019-12-29 DIAGNOSIS — Z5112 Encounter for antineoplastic immunotherapy: Secondary | ICD-10-CM | POA: Diagnosis not present

## 2019-12-29 DIAGNOSIS — D702 Other drug-induced agranulocytosis: Secondary | ICD-10-CM

## 2019-12-29 MED ORDER — FILGRASTIM-SNDZ 480 MCG/0.8ML IJ SOSY
480.0000 ug | PREFILLED_SYRINGE | Freq: Once | INTRAMUSCULAR | Status: AC
  Administered 2019-12-29: 480 ug via SUBCUTANEOUS

## 2019-12-29 NOTE — Patient Instructions (Signed)

## 2019-12-31 ENCOUNTER — Inpatient Hospital Stay: Payer: Medicare HMO

## 2019-12-31 ENCOUNTER — Telehealth: Payer: Self-pay

## 2019-12-31 ENCOUNTER — Inpatient Hospital Stay (HOSPITAL_BASED_OUTPATIENT_CLINIC_OR_DEPARTMENT_OTHER): Payer: Medicare HMO | Admitting: Medical

## 2019-12-31 ENCOUNTER — Other Ambulatory Visit: Payer: Self-pay | Admitting: Hematology

## 2019-12-31 ENCOUNTER — Telehealth: Payer: Self-pay | Admitting: Medical

## 2019-12-31 ENCOUNTER — Other Ambulatory Visit: Payer: Self-pay

## 2019-12-31 VITALS — BP 116/76 | HR 110 | Temp 100.5°F | Resp 18 | Ht 63.0 in | Wt 115.8 lb

## 2019-12-31 DIAGNOSIS — C8318 Mantle cell lymphoma, lymph nodes of multiple sites: Secondary | ICD-10-CM | POA: Diagnosis not present

## 2019-12-31 DIAGNOSIS — R509 Fever, unspecified: Secondary | ICD-10-CM

## 2019-12-31 DIAGNOSIS — Z5112 Encounter for antineoplastic immunotherapy: Secondary | ICD-10-CM | POA: Diagnosis not present

## 2019-12-31 LAB — CBC WITH DIFFERENTIAL/PLATELET
Abs Immature Granulocytes: 0.14 10*3/uL — ABNORMAL HIGH (ref 0.00–0.07)
Basophils Absolute: 0.1 10*3/uL (ref 0.0–0.1)
Basophils Relative: 1 %
Eosinophils Absolute: 0.2 10*3/uL (ref 0.0–0.5)
Eosinophils Relative: 2 %
HCT: 31.5 % — ABNORMAL LOW (ref 36.0–46.0)
Hemoglobin: 10.6 g/dL — ABNORMAL LOW (ref 12.0–15.0)
Immature Granulocytes: 1 %
Lymphocytes Relative: 2 %
Lymphs Abs: 0.3 10*3/uL — ABNORMAL LOW (ref 0.7–4.0)
MCH: 31.5 pg (ref 26.0–34.0)
MCHC: 33.7 g/dL (ref 30.0–36.0)
MCV: 93.8 fL (ref 80.0–100.0)
Monocytes Absolute: 1.7 10*3/uL — ABNORMAL HIGH (ref 0.1–1.0)
Monocytes Relative: 15 %
Neutro Abs: 8.4 10*3/uL — ABNORMAL HIGH (ref 1.7–7.7)
Neutrophils Relative %: 79 %
Platelets: 191 10*3/uL (ref 150–400)
RBC: 3.36 MIL/uL — ABNORMAL LOW (ref 3.87–5.11)
RDW: 14.3 % (ref 11.5–15.5)
WBC: 10.8 10*3/uL — ABNORMAL HIGH (ref 4.0–10.5)
nRBC: 0 % (ref 0.0–0.2)

## 2019-12-31 LAB — CMP (CANCER CENTER ONLY)
ALT: 9 U/L (ref 0–44)
AST: 14 U/L — ABNORMAL LOW (ref 15–41)
Albumin: 3.3 g/dL — ABNORMAL LOW (ref 3.5–5.0)
Alkaline Phosphatase: 83 U/L (ref 38–126)
Anion gap: 12 (ref 5–15)
BUN: 7 mg/dL — ABNORMAL LOW (ref 8–23)
CO2: 24 mmol/L (ref 22–32)
Calcium: 9.5 mg/dL (ref 8.9–10.3)
Chloride: 102 mmol/L (ref 98–111)
Creatinine: 0.85 mg/dL (ref 0.44–1.00)
GFR, Estimated: >60 mL/min (ref >60)
Glucose, Bld: 101 mg/dL — ABNORMAL HIGH (ref 70–99)
Potassium: 3.2 mmol/L — ABNORMAL LOW (ref 3.5–5.1)
Sodium: 138 mmol/L (ref 135–145)
Total Bilirubin: 0.4 mg/dL (ref 0.3–1.2)
Total Protein: 6.7 g/dL (ref 6.5–8.1)

## 2019-12-31 LAB — LACTATE DEHYDROGENASE: LDH: 211 U/L — ABNORMAL HIGH (ref 98–192)

## 2019-12-31 MED ORDER — NYSTATIN 100000 UNIT/ML MT SUSP: 5.0000 mL | Freq: Four times a day (QID) | OROMUCOSAL | 0 refills | Status: DC

## 2019-12-31 MED ORDER — POTASSIUM CHLORIDE CRYS ER 20 MEQ PO TBCR: 20.0000 meq | EXTENDED_RELEASE_TABLET | Freq: Every day | ORAL | 1 refills | Status: DC

## 2019-12-31 MED FILL — NYSTATIN 100,000 UNITS/ML S: 100000 | 9 days supply | Qty: 180 | Fill #0

## 2019-12-31 MED FILL — POTASSIUM CHLORIDE CRYS ER: 20 | 30 days supply | Qty: 30 | Fill #0

## 2019-12-31 NOTE — Telephone Encounter (Signed)
Scheduled per sch msg. Called and spoke with patient. Confirmed appt time today at 1:30 instead of 1pm

## 2019-12-31 NOTE — Patient Instructions (Signed)

## 2019-12-31 NOTE — Telephone Encounter (Signed)
ERROR

## 2020-01-01 LAB — CULTURE, BLOOD (ROUTINE X 2)
Culture: NO GROWTH
Culture: NO GROWTH
Special Requests: ADEQUATE

## 2020-01-02 NOTE — Progress Notes (Signed)
Symptoms Management Clinic Progress Note   Brooke [name] [id number] 01/23/1946 74 y.o.  Brooke [name] is managed by Dr. 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 continues to be followed by Dr. Sullivan Lone and is status post cycle 3, day 1 of Ruxience and Bendeka.  She is scheduled to have a PET scan before returning on 01/07/2020 for her next cycle of chemotherapy.  Chemotherapy-induced neutropenia: [name] presents today in follow-up of a hospital visit yesterday.  She was found to have an [place] of 0.3 and was placed on levofloxacin.  Blood cultures x2 are pending she had a negative chest x-ray, negative urinalysis, negative Covid, and negative flu test.  Augmentin will be added to her treatment for 5 days.  Additionally she will receive Zarxio 480 mcg subcu today and again tomorrow.  She will return to the clinic on 12/31/2019 for repeat labs and possible further dosing with Zarxio.  Please see After Visit Summary for patient specific instructions.  Future Appointments  Date Time Provider [place]  01/07/2020 10:15 AM [facility] None  01/07/2020 10:40 AM Brunetta Genera, MD [facility] None  01/07/2020 11:30 AM [facility]  INFUSION [facility] None  01/08/2020  2:45 PM [facility] INFUSION [facility] None  01/11/2020  7:00 AM [facility] PET CT 1 [facility] [place]    Orders Placed This Encounter  Procedures  . CBC with Differential ([place] Only)  . CMP ([place] only)       Subjective:   Patient ID:  Brooke [name] is a 74 y.o. (DOB [birth year]) female.  Chief Complaint: No chief complaint on file.   HPI Brooke [name]  is a 74 y.o. female with a diagnosis of a mantle cell lymphoma.  She is managed by Dr. Sullivan Lone and is status post cycle 3, day 1 of Ruxience and Bendeka.  She is scheduled to have a PET scan before returning on 01/07/2020 for her next cycle of chemotherapy.  She presents to the clinic today after being seen in the emergency room yesterday at which time she was found to have febrile neutropenia.  Her temperature at that time was 101 with an ANC of 0.3.  She was placed on levofloxacin once daily and told to follow-up with our office today.  Blood cultures x2 are pending.  She had a negative chest x-ray, negative urinalysis, negative Covid, and negative flu test.  She reports that she has had flulike symptoms without rhinorrhea.  She has had headaches.  She has had no sick contacts.  Her temperature has ranged from 97-98 today.  She reports that her mouth is sore and that she has some lesions in her mouth.  She denies night sweats, dizziness, dysuria, vaginal discharge, or a rash.  She states that she is eating better and is feeling better today.  Medications: I have reviewed the patient's current medications.  Allergies: No Known Allergies  Past Medical History:  Diagnosis Date  . Allergy   . Arthritis   . Back pain   . Cancer ([place])   . Complication of anesthesia    bp dropped yrs ago, recent surgeries went ok  . Dysrhythmia    occ, no meds for  . GERD (gastroesophageal reflux disease)   . H. pylori infection   . Headache    migraines occ  .  Hiatal hernia   . Hx of adenomatous colonic polyps 11/30/2017  . Hyperlipidemia   . Hypothyroidism 1970's   hx of years ago, no meds for   . Shortness of breath dyspnea    with exertion, pt told comes from acid reflux    Past Surgical History:  Procedure Laterality Date  . APPENDECTOMY    . COLONOSCOPY  12/07/2013  . DILATION AND CURETTAGE OF UTERUS    . LAPAROSCOPIC CHOLECYSTECTOMY SINGLE SITE WITH INTRAOPERATIVE CHOLANGIOGRAM N/A 10/28/2015   Procedure: LAPAROSCOPIC CHOLECYSTECTOMY WITH INTRAOPERATIVE CHOLANGIOGRAM;  Surgeon: Armandina Gemma, MD;  Location: [facility];  Service: General;  Laterality: N/A;  . LAPAROSCOPY  yrs ago   x 2  . TMJ ARTHROSCOPY    . TOTAL ABDOMINAL HYSTERECTOMY     complete    Family History  Problem Relation Age of Onset  . Colon cancer Father        early 48's  . Ovarian cancer Sister   . Kidney cancer Brother   . Colon polyps Brother   . Colon cancer Paternal Grandfather        unknown age of onset  . Lung cancer Brother   . Colon polyps Sister   . Breast cancer Maternal Aunt   . Breast cancer Cousin   . Rectal cancer Neg Hx   . Stomach cancer Neg Hx   . Esophageal cancer Neg Hx     Social History   Socioeconomic History  . Marital status: Married    Spouse name: Not on file  . Number of children: Not on file  . Years of education: Not on file  . Highest education level: Not on file  Occupational History  . Not on file  Tobacco Use  . Smoking status: Never Smoker  . Smokeless tobacco: Never Used  Vaping Use  . Vaping Use: Never used  Substance and Sexual Activity  . Alcohol use: Yes    Alcohol/week: 4.0 - 5.0 standard drinks    Types: 4 - 5 Cans of beer per week    Comment: 4-5 beers weekly-occ per pt  . Drug use: No  . Sexual activity: Not on file  Other Topics Concern  . Not on file  Social History Narrative  . Not on file   Social Determinants of Health   Financial Resource Strain:   . Difficulty of Paying Living  Expenses: Not on file  Food Insecurity:   . Worried About Charity fundraiser in the Last Year: Not on file  . Ran Out of Food in the Last Year: Not on file  Transportation Needs:   . Lack of Transportation (Medical): Not on file  . Lack of Transportation (Non-Medical): Not on file  Physical Activity:   . Days of Exercise per Week: Not on file  . Minutes of Exercise per Session: Not on file  Stress:   . Feeling of Stress : Not on file  Social Connections:   . Frequency of Communication with Friends and Family: Not on file  . Frequency of Social Gatherings with Friends and Family: Not on  file  . Attends Religious Services: Not on file  . Active Member of Clubs or Organizations: Not on file  . Attends Archivist Meetings: Not on file  . Marital Status: Not on file  Intimate Partner Violence:   . Fear of Current or Ex-Partner: Not on file  . Emotionally Abused: Not on file  . Physically Abused: Not on file  . Sexually Abused: Not on file    Past Medical History, Surgical history, Social history, and Family history were reviewed and updated as appropriate.   Please see review of systems for further details on the patient's review from today.   Review of Systems:  Review of Systems  Constitutional: Positive for appetite change. Negative for chills, diaphoresis and fever.  HENT: Positive for mouth sores. Negative for sore throat, trouble swallowing and voice change.   Respiratory: Negative for cough, chest tightness, shortness of breath and wheezing.   Cardiovascular: Negative for chest pain and palpitations.  Gastrointestinal: Negative for abdominal pain, constipation, diarrhea, nausea and vomiting.  Musculoskeletal: Negative for back pain and myalgias.  Neurological: Positive for headaches. Negative for dizziness and light-headedness.    Objective:   Physical Exam:  BP 98/69 (BP Location: Left Arm, Patient Position: Sitting)   Pulse 100   Temp 98.3 F (36.8 C)  (Tympanic)   Resp 16   Ht 5\' 3"  (1.6 m)   Wt 115 lb 3.2 oz (52.3 kg)   SpO2 100%   BMI 20.41 kg/m  ECOG: 1  Physical Exam Constitutional:      General: She is not in acute distress.    Appearance: She is not diaphoretic.  HENT:     Head: Normocephalic and atraumatic.     Mouth/Throat:     Mouth: Mucous membranes are moist.     Pharynx: Oropharynx is clear. No oropharyngeal exudate or posterior oropharyngeal erythema.  Eyes:     General: No scleral icterus.       Right eye: No discharge.        Left eye: No discharge.     Conjunctiva/sclera: Conjunctivae normal.  Cardiovascular:     Rate and Rhythm: Normal rate and regular rhythm.     Heart sounds: Normal heart sounds. No murmur heard.  No friction rub. No gallop.   Pulmonary:     Effort: Pulmonary effort is normal. No respiratory distress.     Breath sounds: Normal breath sounds. No wheezing or rales.  Abdominal:     General: Bowel sounds are normal. There is no distension.     Tenderness: There is no abdominal tenderness. There is no guarding.  Musculoskeletal:     Right lower leg: No edema.     Left lower leg: No edema.  Skin:    General: Skin is warm and dry.     Findings: No erythema or rash.  Neurological:     Mental Status: She is alert.     Coordination: Coordination normal.     Gait: Gait normal.  Psychiatric:        Mood and Affect: Mood normal.        Behavior: Behavior normal.        Thought Content: Thought content normal.        Judgment: Judgment normal.     Lab Review:     Component Value Date/Time   NA 138 12/31/2019 1320   K 3.2 (L) 12/31/2019 1320   CL 102 12/31/2019 1320   CO2 24 12/31/2019 1320   GLUCOSE  101 (H) 12/31/2019 1320   BUN 7 (L) 12/31/2019 1320   CREATININE 0.85 12/31/2019 1320   CALCIUM 9.5 12/31/2019 1320   PROT 6.7 12/31/2019 1320   ALBUMIN 3.3 (L) 12/31/2019 1320   AST 14 (L) 12/31/2019 1320   ALT 9 12/31/2019 1320   ALKPHOS 83 12/31/2019 1320   BILITOT 0.4  12/31/2019 1320   GFRNONAA >60 12/31/2019 1320   GFRAA >60 11/01/2019 1341       Component Value Date/Time   WBC 10.8 (H) 12/31/2019 1320   RBC 3.36 (L) 12/31/2019 1320   HGB 10.6 (L) 12/31/2019 1320   HGB 10.1 (L) 12/28/2019 1449   HCT 31.5 (L) 12/31/2019 1320   PLT 191 12/31/2019 1320   PLT 169 12/28/2019 1449   MCV 93.8 12/31/2019 1320   MCH 31.5 12/31/2019 1320   MCHC 33.7 12/31/2019 1320   RDW 14.3 12/31/2019 1320   LYMPHSABS 0.3 (L) 12/31/2019 1320   MONOABS 1.7 (H) 12/31/2019 1320   EOSABS 0.2 12/31/2019 1320   BASOSABS 0.1 12/31/2019 1320   -------------------------------  Imaging from last 24 hours (if applicable):  Radiology interpretation: DG Chest 2 View  Result Date: 12/27/2019 CLINICAL DATA:  74 year old female with fever. EXAM: CHEST - 2 VIEW COMPARISON:  Chest radiograph dated 06/08/2017. FINDINGS: There is diffuse chronic interstitial coarsening and bronchitic changes. No focal consolidation, pleural effusion, pneumothorax. The cardiac silhouette is within limits. There is osteopenia with degenerative changes of the spine. Multiple old thoracic compression fractures. No acute osseous pathology. IMPRESSION: No acute cardiopulmonary process. Electronically Signed   By: Anner Crete M.D.   On: 12/27/2019 18:43        This case was discussed with Dr. Irene Limbo. He expresses agreement with my management of this patient.

## 2020-01-04 ENCOUNTER — Other Ambulatory Visit: Payer: Self-pay | Admitting: *Deleted

## 2020-01-04 DIAGNOSIS — C8318 Mantle cell lymphoma, lymph nodes of multiple sites: Secondary | ICD-10-CM

## 2020-01-04 NOTE — Progress Notes (Signed)
Symptoms Management Clinic Progress Note   Brooke [name] [id number] 1945-05-01 74 y.o.  Brooke [name] is managed by Dr. 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 Sullivan Lone and is status post cycle 3, day 1 of Ruxience and Bendeka.  She is scheduled to have a PET scan before returning on 01/07/2020 for her next cycle of chemotherapy.  Chemotherapy-induced neutropenia with fever: Brooke [name] presents today having last been seen on 12/28/2019 after being seen following a hospital visit. She received a dose of Zarxio 480 mcg subcu on 11/19 and 12/29/2019.  She is currently on day 4 of Levaquin and day 4 of Augmentin.  She continues to have fevers.  Her labs returned today with a WBC of 10.8.  She will receive no additional Zarxio.  She woke continue on Augmentin and Levaquin and will return in 7 days for follow-up.  Additionally she was given a prescription for nystatin swish and swallow.  Please see After Visit Summary for patient specific instructions.  Future Appointments  Date Time Provider [place]  01/07/2020 10:15 AM [facility] None  01/07/2020 10:40 AM Brunetta Genera, MD [facility] None  01/07/2020 11:30 AM [facility] INFUSION [facility] None  01/08/2020  2:45 PM [facility] INFUSION [facility] None  01/11/2020  7:00 AM [facility] PET CT 1 [facility] [place]    No orders of the defined types were placed in this encounter.      Subjective:   Patient ID:  Brooke [name] is a 74 y.o. (DOB 29-Oct-1945) female.  Chief Complaint:  Chief  Complaint  Patient presents with  . Follow-up    HPI Brooke [name]  is a 74 y.o. female with a diagnosis of a mantle cell lymphoma.  She is managed by Dr. Sullivan Lone and is status post cycle 3, day 1 of Ruxience and Bendeka.  She is scheduled to have a PET scan before returning on 01/07/2020 for her next cycle of chemotherapy.  She presents today having last been seen on 12/28/2019 after being seen following a hospital visit. She received a dose of Zarxio 480 mcg subcu on 11/19 and 12/29/2019.  She is currently on day 4 of Levaquin and day 4 of Augmentin.  She continues to have fevers ranging from 99.9 to 101.  Most of her fevers have been occurring at night.  She has been taking Tylenol as needed.  She continues to have anorexia..  She continues to have some dizziness, fatigue, nausea which is controlled with her use of her antiemetics, and anorexia.  She denies perirectal pain, mucositis, and has had no recent vaccinations.  Her labs returned today with a WBC of 10.8.    Medications: I have reviewed the patient's current medications.  Allergies: No Known Allergies  Past Medical History:  Diagnosis Date  . Allergy   . Arthritis   . Back pain   . Cancer ([place])   . Complication of anesthesia    bp dropped yrs ago, recent surgeries went ok  . Dysrhythmia    occ, no meds for  . GERD (gastroesophageal reflux disease)   . H. pylori infection   . Headache  migraines occ  . Hiatal hernia   . Hx of adenomatous colonic polyps 11/30/2017  . Hyperlipidemia   . Hypothyroidism 1970's   hx of years ago, no meds for   . Shortness of breath dyspnea    with exertion, pt told comes from acid reflux    Past Surgical History:  Procedure Laterality Date  . APPENDECTOMY    . COLONOSCOPY  12/07/2013  . DILATION AND CURETTAGE OF UTERUS    . LAPAROSCOPIC CHOLECYSTECTOMY SINGLE SITE WITH INTRAOPERATIVE CHOLANGIOGRAM N/A 10/28/2015   Procedure: LAPAROSCOPIC CHOLECYSTECTOMY WITH  INTRAOPERATIVE CHOLANGIOGRAM;  Surgeon: Armandina Gemma, MD;  Location[facility];  Service: General;  Laterality: N/A;  . LAPAROSCOPY  yrs ago   x 2  . TMJ ARTHROSCOPY    . TOTAL ABDOMINAL HYSTERECTOMY     complete    Family History  Problem Relation Age of Onset  . Colon cancer Father        early 81's  . Ovarian cancer Sister   . Kidney cancer Brother   . Colon polyps Brother   . Colon cancer Paternal Grandfather        unknown age of onset  . Lung cancer Brother   . Colon polyps Sister   . Breast cancer Maternal Aunt   . Breast cancer Cousin   . Rectal cancer Neg Hx   . Stomach cancer Neg Hx   . Esophageal cancer Neg Hx     Social History   Socioeconomic History  . Marital status: Married    Spouse name: Not on file  . Number of children: Not on file  . Years of education: Not on file  . Highest education level: Not on file  Occupational History  . Not on file  Tobacco Use  . Smoking status: Never Smoker  . Smokeless tobacco: Never Used  Vaping Use  . Vaping Use: Never used  Substance and Sexual Activity  . Alcohol use: Yes    Alcohol/week: 4.0 - 5.0 standard drinks    Types: 4 - 5 Cans of beer per week    Comment: 4-5 beers weekly-occ per pt  . Drug use: No  . Sexual activity: Not on file  Other Topics Concern  . Not on file  Social History Narrative  . Not on file   Social Determinants of Health   Financial Resource Strain:   . Difficulty of Paying Living Expenses: Not on file  Food Insecurity:   . Worried About Charity fundraiser in the Last Year: Not on file  . Ran Out of Food in the Last Year: Not on file  Transportation Needs:   . Lack of Transportation (Medical): Not on file  . Lack of Transportation (Non-Medical): Not on file  Physical Activity:   . Days of Exercise per Week: Not on file  . Minutes of Exercise per Session: Not on file  Stress:   . Feeling of Stress : Not on file  Social Connections:   . Frequency of Communication with Friends  and Family: Not on file  . Frequency of Social Gatherings with Friends and Family: Not on file  . Attends Religious Services: Not on file  . Active Member of Clubs or Organizations: Not on file  . Attends Archivist Meetings: Not on file  . Marital Status: Not on file  Intimate Partner Violence:   . Fear of Current or Ex-Partner: Not on file  . Emotionally Abused: Not on file  . Physically Abused: Not on  file  . Sexually Abused: Not on file    Past Medical History, Surgical history, Social history, and Family history were reviewed and updated as appropriate.   Please see review of systems for further details on the patient's review from today.   Review of Systems:  Review of Systems  Constitutional: Positive for appetite change, chills, fatigue and fever. Negative for diaphoresis.  HENT: Negative for mouth sores, sore throat, trouble swallowing and voice change.   Respiratory: Negative for cough, chest tightness, shortness of breath and wheezing.   Cardiovascular: Negative for chest pain and palpitations.  Gastrointestinal: Positive for nausea. Negative for abdominal pain, constipation, diarrhea and vomiting.  Musculoskeletal: Negative for back pain and myalgias.  Neurological: Positive for dizziness. Negative for light-headedness and headaches.    Objective:   Physical Exam:  BP 116/76 (BP Location: Left Arm, Patient Position: Sitting)   Pulse (!) 110   Temp (!) 100.5 F (38.1 C) (Tympanic)   Resp 18   Ht 5\' 3"  (1.6 m)   Wt 115 lb 12.8 oz (52.5 kg)   SpO2 100%   BMI 20.51 kg/m  ECOG: 1  Physical Exam Constitutional:      General: She is not in acute distress.    Appearance: She is not diaphoretic.  HENT:     Head: Normocephalic and atraumatic.     Mouth/Throat:     Mouth: Mucous membranes are moist.     Pharynx: Oropharynx is clear. No oropharyngeal exudate or posterior oropharyngeal erythema.  Eyes:     General: No scleral icterus.       Right eye: No  discharge.        Left eye: No discharge.     Conjunctiva/sclera: Conjunctivae normal.  Cardiovascular:     Rate and Rhythm: Regular rhythm. Tachycardia present.     Heart sounds: Normal heart sounds. No murmur heard.  No friction rub. No gallop.   Pulmonary:     Effort: Pulmonary effort is normal. No respiratory distress.     Breath sounds: Normal breath sounds. No wheezing or rales.  Abdominal:     General: Bowel sounds are normal. There is no distension.     Tenderness: There is no abdominal tenderness. There is no guarding.  Musculoskeletal:     Right lower leg: No edema.     Left lower leg: No edema.  Lymphadenopathy:     Head:     Right side of head: Submandibular adenopathy present.     Left side of head: Submandibular adenopathy present.     Cervical: No cervical adenopathy.  Skin:    General: Skin is warm and dry.     Findings: No erythema or rash.  Neurological:     Mental Status: She is alert.     Coordination: Coordination normal.     Gait: Gait normal.  Psychiatric:        Mood and Affect: Mood normal.        Behavior: Behavior normal.        Thought Content: Thought content normal.        Judgment: Judgment normal.     Lab Review:     Component Value Date/Time   NA 138 12/31/2019 1320   K 3.2 (L) 12/31/2019 1320   CL 102 12/31/2019 1320   CO2 24 12/31/2019 1320   GLUCOSE 101 (H) 12/31/2019 1320   BUN 7 (L) 12/31/2019 1320   CREATININE 0.85 12/31/2019 1320   CALCIUM 9.5 12/31/2019 1320   PROT  6.7 12/31/2019 1320   ALBUMIN 3.3 (L) 12/31/2019 1320   AST 14 (L) 12/31/2019 1320   ALT 9 12/31/2019 1320   ALKPHOS 83 12/31/2019 1320   BILITOT 0.4 12/31/2019 1320   GFRNONAA >60 12/31/2019 1320   GFRAA >60 11/01/2019 1341       Component Value Date/Time   WBC 10.8 (H) 12/31/2019 1320   RBC 3.36 (L) 12/31/2019 1320   HGB 10.6 (L) 12/31/2019 1320   HGB 10.1 (L) 12/28/2019 1449   HCT 31.5 (L) 12/31/2019 1320   PLT 191 12/31/2019 1320   PLT 169  12/28/2019 1449   MCV 93.8 12/31/2019 1320   MCH 31.5 12/31/2019 1320   MCHC 33.7 12/31/2019 1320   RDW 14.3 12/31/2019 1320   LYMPHSABS 0.3 (L) 12/31/2019 1320   MONOABS 1.7 (H) 12/31/2019 1320   EOSABS 0.2 12/31/2019 1320   BASOSABS 0.1 12/31/2019 1320   -------------------------------  Imaging from last 24 hours (if applicable):  Radiology interpretation: DG Chest 2 View  Result Date: 12/27/2019 CLINICAL DATA:  74 year old female with fever. EXAM: CHEST - 2 VIEW COMPARISON:  Chest radiograph dated 06/08/2017. FINDINGS: There is diffuse chronic interstitial coarsening and bronchitic changes. No focal consolidation, pleural effusion, pneumothorax. The cardiac silhouette is within limits. There is osteopenia with degenerative changes of the spine. Multiple old thoracic compression fractures. No acute osseous pathology. IMPRESSION: No acute cardiopulmonary process. Electronically Signed   By: Anner Crete M.D.   On: 12/27/2019 18:43        This case was discussed with Dr. Irene Limbo. He expresses agreement with my management of this patient.

## 2020-01-07 ENCOUNTER — Inpatient Hospital Stay (HOSPITAL_BASED_OUTPATIENT_CLINIC_OR_DEPARTMENT_OTHER): Payer: Medicare HMO | Admitting: Hematology

## 2020-01-07 ENCOUNTER — Inpatient Hospital Stay: Payer: Medicare HMO

## 2020-01-07 ENCOUNTER — Other Ambulatory Visit: Payer: Self-pay | Admitting: Hematology

## 2020-01-07 ENCOUNTER — Other Ambulatory Visit: Payer: Self-pay

## 2020-01-07 VITALS — BP 94/66 | HR 117 | Temp 97.5°F | Resp 18 | Ht 63.0 in | Wt 113.6 lb

## 2020-01-07 DIAGNOSIS — Z5112 Encounter for antineoplastic immunotherapy: Secondary | ICD-10-CM | POA: Diagnosis not present

## 2020-01-07 DIAGNOSIS — C8318 Mantle cell lymphoma, lymph nodes of multiple sites: Secondary | ICD-10-CM

## 2020-01-07 DIAGNOSIS — Z7189 Other specified counseling: Secondary | ICD-10-CM | POA: Diagnosis not present

## 2020-01-07 LAB — CBC WITH DIFFERENTIAL (CANCER CENTER ONLY)
Abs Immature Granulocytes: 0.17 10*3/uL — ABNORMAL HIGH (ref 0.00–0.07)
Basophils Absolute: 0 10*3/uL (ref 0.0–0.1)
Basophils Relative: 0 %
Eosinophils Absolute: 0 10*3/uL (ref 0.0–0.5)
Eosinophils Relative: 0 %
HCT: 33.1 % — ABNORMAL LOW (ref 36.0–46.0)
Hemoglobin: 11.3 g/dL — ABNORMAL LOW (ref 12.0–15.0)
Immature Granulocytes: 2 %
Lymphocytes Relative: 3 %
Lymphs Abs: 0.2 10*3/uL — ABNORMAL LOW (ref 0.7–4.0)
MCH: 31.8 pg (ref 26.0–34.0)
MCHC: 34.1 g/dL (ref 30.0–36.0)
MCV: 93.2 fL (ref 80.0–100.0)
Monocytes Absolute: 0.4 10*3/uL (ref 0.1–1.0)
Monocytes Relative: 5 %
Neutro Abs: 7.3 10*3/uL (ref 1.7–7.7)
Neutrophils Relative %: 90 %
Platelet Count: 241 10*3/uL (ref 150–400)
RBC: 3.55 MIL/uL — ABNORMAL LOW (ref 3.87–5.11)
RDW: 14.3 % (ref 11.5–15.5)
WBC Count: 8 10*3/uL (ref 4.0–10.5)
nRBC: 0 % (ref 0.0–0.2)

## 2020-01-07 LAB — CMP (CANCER CENTER ONLY)
ALT: 6 U/L (ref 0–44)
AST: 10 U/L — ABNORMAL LOW (ref 15–41)
Albumin: 3.5 g/dL (ref 3.5–5.0)
Alkaline Phosphatase: 64 U/L (ref 38–126)
Anion gap: 13 (ref 5–15)
BUN: 20 mg/dL (ref 8–23)
CO2: 21 mmol/L — ABNORMAL LOW (ref 22–32)
Calcium: 10.6 mg/dL — ABNORMAL HIGH (ref 8.9–10.3)
Chloride: 103 mmol/L (ref 98–111)
Creatinine: 0.99 mg/dL (ref 0.44–1.00)
GFR, Estimated: >60 mL/min (ref >60)
Glucose, Bld: 153 mg/dL — ABNORMAL HIGH (ref 70–99)
Potassium: 4.2 mmol/L (ref 3.5–5.1)
Sodium: 137 mmol/L (ref 135–145)
Total Bilirubin: 0.5 mg/dL (ref 0.3–1.2)
Total Protein: 6.6 g/dL (ref 6.5–8.1)

## 2020-01-07 LAB — LACTATE DEHYDROGENASE: LDH: 159 U/L (ref 98–192)

## 2020-01-07 MED ORDER — DRONABINOL 2.5 MG PO CAPS
2.5000 mg | ORAL_CAPSULE | Freq: Two times a day (BID) | ORAL | 0 refills | Status: DC
Start: 2020-01-07 — End: 2020-01-07

## 2020-01-07 MED ORDER — LORAZEPAM 0.5 MG PO TABS: 0.5000 mg | ORAL_TABLET | Freq: Three times a day (TID) | ORAL | 0 refills | Status: DC | PRN

## 2020-01-07 MED FILL — LORazepam 0.5 MG TABS: 0.5 | 10 days supply | Qty: 30 | Fill #0

## 2020-01-07 NOTE — Progress Notes (Signed)
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[place])   10/19/2019 -  Chemotherapy   The patient had dexamethasone (DECADRON) 4 MG tablet, 8 mg, Oral, Daily, 1 of 1 cycle, Start date: 11/21/2019, End date: -- palonosetron (ALOXI) injection 0.25 mg, 0.25 mg, Intravenous,  Once, 3 of 6 cycles Administration: 0.25 mg (10/19/2019), 0.25 mg (11/14/2019), 0.25 mg (12/12/2019), 0.25 mg (11/23/2019) pegfilgrastim (NEULASTA ONPRO KIT) injection 6 mg, 6 mg, Subcutaneous, Once, 0 of 1 cycle pegfilgrastim-cbqv (UDENYCA) injection 6 mg, 6 mg, Subcutaneous, Once, 0 of 3 cycles bendamustine (BENDEKA) 150 mg in sodium chloride 0.9 % 50 mL (2.6786 mg/mL) chemo infusion, 90 mg/m2 = 150 mg, Intravenous,  Once, 3 of 6 cycles Administration: 150 mg (10/19/2019), 150 mg (10/20/2019), 150 mg (11/14/2019), 150 mg (11/23/2019), 150 mg (12/12/2019), 150 mg (12/13/2019) riTUXimab-pvvr (RUXIENCE) 600 mg in sodium chloride 0.9 % 250 mL (1.9355 mg/mL) infusion, 375 mg/m2 = 600 mg, Intravenous,  Once, 3 of 6 cycles Administration: 600 mg (10/19/2019), 600 mg (12/12/2019)  for chemotherapy treatment.      INTERVAL HISTORY: Mrs. Raigoza is a wonderful woman who is here for evaluation and management of Mantle Cell Lymphoma. The patient's last visit with [place] was on 12/12/2019. The pt reports that she is doing well overall.  The pt reports that she has been able to eat and drink better over the last week. Pt feels that she is still eating at less than half of her baseline. She has not experienced any fevers since last Tuesday. Pt reports tenseness, nervousness, tremors, and heart palpitations. Pt continues experiencing night sweats about  twice per week. She is scheduled to receive her PET/CT on Friday.   Pt has previously used Xanax as needed for anxiety.   Lab results today (01/07/20) of CBC w/diff and CMP is as follows: all values are WNL except for RBC at 3.55, Hgb at 11.3, HCT at 33.1, Lymphs Abs at 0.2K, Abs Immature Granulocytes at 0.17K, CO2 at 21, Glucose at 153, Calcium at 10.6, AST at 10. 01/07/2020 LDH at 159  On review of systems, pt reports lightheadedness, dizziness, weakness, runny nose, anxiety, night sweats and denies fevers, sore throat, diarrhea, rash, new joint pain/swelling, urinary habit changes, abdominal pain and any other symptoms.    REVIEW OF SYSTEMS:   A 10+ POINT REVIEW OF SYSTEMS WAS OBTAINED including neurology, dermatology, psychiatry, cardiac, respiratory, lymph, extremities, GI, GU, Musculoskeletal, constitutional, breasts, reproductive, HEENT.  All pertinent positives are noted in the HPI.  All others are negative.   Past Medical History:  Diagnosis Date  . Allergy   . Arthritis   . Back pain   . Cancer ([place])   . Complication of anesthesia    bp dropped yrs ago, recent surgeries went ok  . Dysrhythmia    occ, no meds for  . GERD (gastroesophageal reflux disease)   . H. pylori infection   . Headache    migraines occ  . Hiatal hernia   . Hx of adenomatous colonic polyps 11/30/2017  . Hyperlipidemia   . Hypothyroidism 1970's   hx of years ago, no meds for   . Shortness of breath dyspnea    with exertion, pt told comes  from acid reflux    . Past Surgical History:  Procedure Laterality Date  . APPENDECTOMY    . COLONOSCOPY  12/07/2013  . DILATION AND CURETTAGE OF UTERUS    . LAPAROSCOPIC CHOLECYSTECTOMY SINGLE SITE WITH INTRAOPERATIVE CHOLANGIOGRAM N/A 10/28/2015   Procedure: LAPAROSCOPIC CHOLECYSTECTOMY WITH INTRAOPERATIVE CHOLANGIOGRAM;  Surgeon: Armandina Gemma, MD;  Location: [facility];  Service: General;  Laterality: N/A;  . LAPAROSCOPY  yrs ago   x 2  . TMJ ARTHROSCOPY    .  TOTAL ABDOMINAL HYSTERECTOMY     complete    . Social History   Tobacco Use  . Smoking status: Never Smoker  . Smokeless tobacco: Never Used  Vaping Use  . Vaping Use: Never used  Substance Use Topics  . Alcohol use: Yes    Alcohol/week: 4.0 - 5.0 standard drinks    Types: 4 - 5 Cans of beer per week    Comment: 4-5 beers weekly-occ per pt  . Drug use: No    ALLERGIES:  has No Known Allergies.  MEDICATIONS:  Current Outpatient Medications  Medication Sig Dispense Refill  . acyclovir (ZOVIRAX) 400 MG tablet TAKE 1 TABLET BY MOUTH TWICE A DAY 30 tablet 3  . amoxicillin-clavulanate (AUGMENTIN) 875-125 MG tablet Take 1 tablet by mouth 2 (two) times daily. 10 tablet 0  . Cholecalciferol (VITAMIN D3) 5000 units CAPS Take 1 capsule by mouth daily.    . cyclobenzaprine (FLEXERIL) 10 MG tablet 10 mg.     . dexamethasone (DECADRON) 4 MG tablet Take 2 tablets (8 mg total) by mouth daily. Start 3 days before and continue 2 days after bendamustine/Rituxan chemotherapy for 2 days. Take with food. 30 tablet 1  . diclofenac Sodium (VOLTAREN) 1 % GEL Apply 1 application topically 4 (four) times daily.    [name] Kitchen dronabinol (MARINOL) 2.5 MG capsule Take 1 capsule (2.5 mg total) by mouth 2 (two) times daily before a meal. 60 capsule 0  . fluticasone (FLONASE) 50 MCG/ACT nasal spray Place into both nostrils.    [name] Kitchen HYDROcodone-acetaminophen (NORCO/VICODIN) 5-325 MG tablet Take 1-2 tablets by mouth every 4 (four) hours as needed for moderate pain. 20 tablet 0  . ibuprofen (ADVIL,MOTRIN) 200 MG tablet Take 400 mg by mouth every 6 (six) hours as needed.    [name] Kitchen LORazepam (ATIVAN) 0.5 MG tablet Take 1 tablet (0.5 mg total) by mouth every 8 (eight) hours as needed for anxiety (Nausea or vomiting). 30 tablet 0  . nystatin (MYCOSTATIN) 100000 UNIT/ML suspension Take 5 mLs (500,000 Units total) by mouth 4 (four) times daily. 180 mL 0  . omeprazole (PRILOSEC) 20 MG capsule Take 20 mg by mouth every morning.    .  ondansetron (ZOFRAN) 8 MG tablet Take 1 tablet (8 mg total) by mouth 2 (two) times daily as needed for refractory nausea / vomiting. Start on day 2 after bendamustine chemo. 30 tablet 1  . oxymetazoline (AFRIN) 0.05 % nasal spray Place 1 spray into both nostrils as needed for congestion.    . pantoprazole (PROTONIX) 40 MG tablet Take 1 tablet (40 mg total) by mouth every morning. Before breakfast (Patient taking differently: Take 40 mg by mouth every evening. Before breakfast) 90 tablet 1  . potassium chloride SA (KLOR-CON) 20 MEQ tablet Take 1 tablet (20 mEq total) by mouth daily. 30 tablet 1  . predniSONE (DELTASONE) 20 MG tablet Take 1 tablet (20 mg total) by mouth daily with breakfast. 5 tablet 0  . prochlorperazine (COMPAZINE) 10 MG tablet Take 1  tablet (10 mg total) by mouth every 6 (six) hours as needed (Nausea or vomiting). 30 tablet 1  . promethazine (PHENERGAN) 25 MG tablet Take 1 tablet (25 mg total) by mouth every 6 (six) hours as needed for nausea or vomiting. 10 tablet 0  . rosuvastatin (CRESTOR) 5 MG tablet Take 5 mg by mouth daily.     No current facility-administered medications for this visit.    PHYSICAL EXAMINATION:  GENERAL:alert, in no acute distress and comfortable SKIN: no acute rashes, no significant lesions EYES: conjunctiva are pink and non-injected, sclera anicteric OROPHARYNX: MMM, no exudates, no oropharyngeal erythema or ulceration NECK: supple, no JVD LYMPH:  no palpable lymphadenopathy in the cervical, axillary or inguinal regions LUNGS: clear to auscultation b/l with normal respiratory effort HEART: regular rate & rhythm ABDOMEN:  normoactive bowel sounds , non tender, not distended. No palpable hepatosplenomegaly.  Extremity: no pedal edema PSYCH: alert & oriented x 3 with fluent speech NEURO: no focal motor/sensory deficits  LABORATORY DATA:   I have reviewed the data as listed  . CBC Latest Ref Rng & Units 01/07/2020 12/31/2019 12/28/2019  WBC 4.0  - 10.5 K/uL 8.0 10.8(H) 1.2(L)  Hemoglobin 12.0 - 15.0 g/dL 11.3(L) 10.6(L) 10.1(L)  Hematocrit 36 - 46 % 33.1(L) 31.5(L) 29.7(L)  Platelets 150 - 400 K/uL 241 191 169    . CMP Latest Ref Rng & Units 01/07/2020 12/31/2019 12/28/2019  Glucose 70 - 99 mg/dL 153(H) 101(H) 88  BUN 8 - 23 mg/dL 20 7(L) 9  Creatinine 0.44 - 1.00 mg/dL 0.99 0.85 0.79  Sodium 135 - 145 mmol/L 137 138 139  Potassium 3.5 - 5.1 mmol/L 4.2 3.2(L) 3.6  Chloride 98 - 111 mmol/L 103 102 105  CO2 22 - 32 mmol/L 21(L) 24 26  Calcium 8.9 - 10.3 mg/dL 10.6(H) 9.5 9.2  Total Protein 6.5 - 8.1 g/dL 6.6 6.7 6.4(L)  Total Bilirubin 0.3 - 1.2 mg/dL 0.5 0.4 0.4  Alkaline Phos 38 - 126 U/L 64 83 65  AST 15 - 41 U/L 10(L) 14(L) 15  ALT 0 - 44 U/L _0 RADIOGRAPHIC STUDIES: I have personally reviewed the radiological images as listed and agreed with the findings in the report. DG Chest 2 View  Result Date: 12/27/2019 CLINICAL DATA:  74 year old female with fever. EXAM: CHEST - 2 VIEW COMPARISON:  Chest radiograph dated 06/08/2017. FINDINGS: There is diffuse chronic interstitial coarsening and bronchitic changes. No focal consolidation, pleural effusion, pneumothorax. The cardiac silhouette is within limits. There is osteopenia with degenerative changes of the spine. Multiple old thoracic compression fractures. No acute osseous pathology. IMPRESSION: No acute cardiopulmonary process. Electronically Signed   By: Anner Crete M.D.   On: 12/27/2019 18:43    ASSESSMENT & PLAN:   74 yo with   1) Stage III/IV Mantle cell lymphoma 2) Allergic reaction to Rapid Rituxan. - rash and low grade fever - nearly resolved with solumedrol/tylenol, benadryl and famotidine today PLAN: -Discussed pt labwork today, 01/07/20; blood counts are improving, blood chemistries are steady - reflect dehydration, LDH is WNL. -The pt has no prohibitive toxicities from continuing C4D1 of BR in 1 week. -Will continue Singular with premeds. Add  D4 Udenyca with each cycle. -Will hold treatment for one week to allow time for appetite to improve.  -Discussed starting low-dose Marinol to improve appetite - pt agrees. -Recommend pt use nutritional supplements and increase dietary sodium temporarily. -Recommended that the pt continue to eat well & drink  at least 48-64 oz of water each day.  -Recommend pt use Lorazepam prn for anxiety. -Recommend pt f/u for PET/CT as scheduled. -Continue Claritin nightly -Refill Lorazepam -Rx Marinol -Will see back in 1 week with labs  FOLLOW UP: Plz move C4 of BR (D1 and D2) from today 11/29 out by 1 week. Adding D4 for udenyca with every cycle Labs with C4D1 MD visit with C4D1 in 1 week (can see in infusion if needed)   The total time spent in the appt was 30 minutes and more than 50% was on counseling and direct patient cares.  All of the patient's questions were answered with apparent satisfaction. The patient knows to call the clinic with any problems, questions or concerns.   Sullivan Lone MD [place] AAHIVMS [facility] Hematology/Oncology Physician [facility]  (Office):       [phone] (Work cell):  [phone] (Fax):           [phone]  I, Yevette Edwards, am acting as a scribe for Dr. Sullivan Lone.   .I have reviewed the above documentation for accuracy and completeness, and I agree with the above. Brunetta Genera MD

## 2020-01-08 ENCOUNTER — Inpatient Hospital Stay: Payer: Medicare HMO

## 2020-01-09 ENCOUNTER — Other Ambulatory Visit: Payer: Medicare HMO

## 2020-01-09 ENCOUNTER — Ambulatory Visit: Payer: Medicare HMO | Admitting: Hematology

## 2020-01-10 ENCOUNTER — Ambulatory Visit: Payer: Medicare HMO

## 2020-01-10 MED FILL — DRONABINOL 2.5 MG CAPSULE: 2.5 | 30 days supply | Qty: 60 | Fill #0

## 2020-01-11 ENCOUNTER — Other Ambulatory Visit: Payer: Self-pay

## 2020-01-11 ENCOUNTER — Ambulatory Visit (HOSPITAL_COMMUNITY)
Admission: RE | Admit: 2020-01-11 | Discharge: 2020-01-11 | Disposition: A | Discharge status: Home / Self Care | Payer: Medicare HMO | Source: Ambulatory Visit | Attending: Hematology | Admitting: Hematology

## 2020-01-11 DIAGNOSIS — C8318 Mantle cell lymphoma, lymph nodes of multiple sites: Secondary | ICD-10-CM

## 2020-01-11 LAB — GLUCOSE, CAPILLARY: Glucose-Capillary: 88 mg/dL (ref 70–99)

## 2020-01-11 MED ORDER — FLUDEOXYGLUCOSE F - 18 (FDG) INJECTION
5.6000 | Freq: Once | INTRAVENOUS | Status: AC | PRN
  Administered 2020-01-11: 5.6 via INTRAVENOUS

## 2020-01-14 ENCOUNTER — Other Ambulatory Visit: Payer: Self-pay | Admitting: Hematology

## 2020-01-15 ENCOUNTER — Inpatient Hospital Stay: Payer: Medicare HMO

## 2020-01-15 ENCOUNTER — Telehealth: Payer: Self-pay | Admitting: Hematology

## 2020-01-15 ENCOUNTER — Other Ambulatory Visit: Payer: Self-pay

## 2020-01-15 ENCOUNTER — Inpatient Hospital Stay: Payer: Medicare HMO | Attending: Hematology

## 2020-01-15 ENCOUNTER — Inpatient Hospital Stay (HOSPITAL_BASED_OUTPATIENT_CLINIC_OR_DEPARTMENT_OTHER): Payer: Medicare HMO | Admitting: Hematology

## 2020-01-15 VITALS — BP 120/74 | HR 78 | Temp 98.2°F | Resp 17

## 2020-01-15 DIAGNOSIS — D702 Other drug-induced agranulocytosis: Secondary | ICD-10-CM

## 2020-01-15 DIAGNOSIS — Z5189 Encounter for other specified aftercare: Secondary | ICD-10-CM | POA: Insufficient documentation

## 2020-01-15 DIAGNOSIS — Z23 Encounter for immunization: Secondary | ICD-10-CM | POA: Insufficient documentation

## 2020-01-15 DIAGNOSIS — C8318 Mantle cell lymphoma, lymph nodes of multiple sites: Secondary | ICD-10-CM

## 2020-01-15 DIAGNOSIS — C8594 Non-Hodgkin lymphoma, unspecified, lymph nodes of axilla and upper limb: Secondary | ICD-10-CM | POA: Diagnosis present

## 2020-01-15 DIAGNOSIS — Z7189 Other specified counseling: Secondary | ICD-10-CM

## 2020-01-15 DIAGNOSIS — Z5111 Encounter for antineoplastic chemotherapy: Secondary | ICD-10-CM | POA: Diagnosis present

## 2020-01-15 DIAGNOSIS — Z5112 Encounter for antineoplastic immunotherapy: Secondary | ICD-10-CM | POA: Insufficient documentation

## 2020-01-15 DIAGNOSIS — D72819 Decreased white blood cell count, unspecified: Secondary | ICD-10-CM | POA: Diagnosis not present

## 2020-01-15 LAB — CMP (CANCER CENTER ONLY)
ALT: 20 U/L (ref 0–44)
AST: 22 U/L (ref 15–41)
Albumin: 4 g/dL (ref 3.5–5.0)
Alkaline Phosphatase: 67 U/L (ref 38–126)
Anion gap: 11 (ref 5–15)
BUN: 16 mg/dL (ref 8–23)
CO2: 24 mmol/L (ref 22–32)
Calcium: 9.8 mg/dL (ref 8.9–10.3)
Chloride: 98 mmol/L (ref 98–111)
Creatinine: 0.9 mg/dL (ref 0.44–1.00)
GFR, Estimated: >60 mL/min (ref >60)
Glucose, Bld: 140 mg/dL — ABNORMAL HIGH (ref 70–99)
Potassium: 4.5 mmol/L (ref 3.5–5.1)
Sodium: 133 mmol/L — ABNORMAL LOW (ref 135–145)
Total Bilirubin: 0.4 mg/dL (ref 0.3–1.2)
Total Protein: 6.5 g/dL (ref 6.5–8.1)

## 2020-01-15 LAB — CBC WITH DIFFERENTIAL/PLATELET
Abs Immature Granulocytes: 0.02 10*3/uL (ref 0.00–0.07)
Basophils Absolute: 0 10*3/uL (ref 0.0–0.1)
Basophils Relative: 0 %
Eosinophils Absolute: 0 10*3/uL (ref 0.0–0.5)
Eosinophils Relative: 0 %
HCT: 28.9 % — ABNORMAL LOW (ref 36.0–46.0)
Hemoglobin: 9.9 g/dL — ABNORMAL LOW (ref 12.0–15.0)
Immature Granulocytes: 1 %
Lymphocytes Relative: 7 %
Lymphs Abs: 0.2 10*3/uL — ABNORMAL LOW (ref 0.7–4.0)
MCH: 32.5 pg (ref 26.0–34.0)
MCHC: 34.3 g/dL (ref 30.0–36.0)
MCV: 94.8 fL (ref 80.0–100.0)
Monocytes Absolute: 0.5 10*3/uL (ref 0.1–1.0)
Monocytes Relative: 16 %
Neutro Abs: 2.5 10*3/uL (ref 1.7–7.7)
Neutrophils Relative %: 76 %
Platelets: 231 10*3/uL (ref 150–400)
RBC: 3.05 MIL/uL — ABNORMAL LOW (ref 3.87–5.11)
RDW: 14.3 % (ref 11.5–15.5)
WBC: 3.3 10*3/uL — ABNORMAL LOW (ref 4.0–10.5)
nRBC: 0 % (ref 0.0–0.2)

## 2020-01-15 MED ORDER — FAMOTIDINE IN NACL 20-0.9 MG/50ML-% IV SOLN
20.0000 mg | Freq: Once | INTRAVENOUS | Status: AC
  Administered 2020-01-15: 20 mg via INTRAVENOUS

## 2020-01-15 MED ORDER — PALONOSETRON HCL INJECTION 0.25 MG/5ML
INTRAVENOUS | Status: AC
  Filled 2020-01-15: qty 5

## 2020-01-15 MED ORDER — DIPHENHYDRAMINE HCL 25 MG PO CAPS
ORAL_CAPSULE | ORAL | Status: AC
  Filled 2020-01-15: qty 2

## 2020-01-15 MED ORDER — SODIUM CHLORIDE 0.9 % IV SOLN
20.0000 mg | Freq: Once | INTRAVENOUS | Status: AC
  Administered 2020-01-15: 20 mg via INTRAVENOUS
  Filled 2020-01-15: qty 20

## 2020-01-15 MED ORDER — SODIUM CHLORIDE 0.9 % IV SOLN
Freq: Once | INTRAVENOUS | Status: AC
  Filled 2020-01-15: qty 250

## 2020-01-15 MED ORDER — PALONOSETRON HCL INJECTION 0.25 MG/5ML
0.2500 mg | Freq: Once | INTRAVENOUS | Status: AC
  Administered 2020-01-15: 0.25 mg via INTRAVENOUS

## 2020-01-15 MED ORDER — FAMOTIDINE IN NACL 20-0.9 MG/50ML-% IV SOLN
INTRAVENOUS | Status: AC
  Filled 2020-01-15: qty 50

## 2020-01-15 MED ORDER — SODIUM CHLORIDE 0.9 % IV SOLN
10.0000 mg | Freq: Once | INTRAVENOUS | Status: DC
  Filled 2020-01-15: qty 1

## 2020-01-15 MED ORDER — SODIUM CHLORIDE 0.9 % IV SOLN
375.0000 mg/m2 | Freq: Once | INTRAVENOUS | Status: AC
  Administered 2020-01-15: 600 mg via INTRAVENOUS
  Filled 2020-01-15: qty 50

## 2020-01-15 MED ORDER — SODIUM CHLORIDE 0.9 % IV SOLN
90.0000 mg/m2 | Freq: Once | INTRAVENOUS | Status: AC
  Administered 2020-01-15: 150 mg via INTRAVENOUS
  Filled 2020-01-15: qty 6

## 2020-01-15 MED ORDER — MONTELUKAST SODIUM 10 MG PO TABS
10.0000 mg | ORAL_TABLET | Freq: Once | ORAL | Status: AC
  Administered 2020-01-15: 10 mg via ORAL
  Filled 2020-01-15: qty 1

## 2020-01-15 MED ORDER — ACETAMINOPHEN 325 MG PO TABS
650.0000 mg | ORAL_TABLET | Freq: Once | ORAL | Status: AC
  Administered 2020-01-15: 650 mg via ORAL

## 2020-01-15 MED ORDER — DIPHENHYDRAMINE HCL 25 MG PO CAPS
50.0000 mg | ORAL_CAPSULE | Freq: Once | ORAL | Status: AC
  Administered 2020-01-15: 50 mg via ORAL

## 2020-01-15 MED ORDER — ACETAMINOPHEN 325 MG PO TABS
ORAL_TABLET | ORAL | Status: AC
  Filled 2020-01-15: qty 2

## 2020-01-15 NOTE — Progress Notes (Signed)
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[place])   10/19/2019 -  Chemotherapy   The patient had dexamethasone (DECADRON) 4 MG tablet, 8 mg, Oral, Daily, 1 of 1 cycle, Start date: 11/21/2019, End date: -- palonosetron (ALOXI) injection 0.25 mg, 0.25 mg, Intravenous,  Once, 4 of 6 cycles Administration: 0.25 mg (10/19/2019), 0.25 mg (11/14/2019), 0.25 mg (12/12/2019), 0.25 mg (11/23/2019) pegfilgrastim-cbqv (UDENYCA) injection 6 mg, 6 mg, Subcutaneous, Once, 1 of 3 cycles bendamustine (BENDEKA) 150 mg in sodium chloride 0.9 % 50 mL (2.6786 mg/mL) chemo infusion, 90 mg/m2 = 150 mg, Intravenous,  Once, 4 of 6 cycles Administration: 150 mg (10/19/2019), 150 mg (10/20/2019), 150 mg (11/14/2019), 150 mg (11/23/2019), 150 mg (12/12/2019), 150 mg (12/13/2019) riTUXimab-pvvr (RUXIENCE) 600 mg in sodium chloride 0.9 % 250 mL (1.9355 mg/mL) infusion, 375 mg/m2 = 600 mg, Intravenous,  Once, 4 of 6 cycles Administration: 600 mg (10/19/2019), 600 mg (12/12/2019)  for chemotherapy treatment.      INTERVAL HISTORY:  I connected with  [name] on 01/15/20 by telephone and verified that I am speaking with the correct person using two identifiers.   I discussed the limitations of evaluation and management by telemedicine. The patient expressed understanding and agreed to proceed.  Other persons participating in the visit and their role in the encounter:        -Yevette Edwards, Medical Scribe  Patient's location: Home Provider's location: [place] at [facility]  Mrs. Porras is a wonderful woman who is here for evaluation and management of Mantle Cell Lymphoma. The patient's last visit with [place]  was on 01/07/2020. The pt reports that she is doing well overall.  The pt reports that she is feeling well and notes improvement in her appetite. She believes that the Marinol is helping her appetite.  Of note since the patient's last visit, pt has had PET/CT ([id number]) completed on 01/11/2020 with results revealing "1. Resolution of nodal disease in the chest and abdomen, only very small lymph nodes are seen in the area of the LEFT pelvis and bilateral inguinal region. These lymph nodes display SUV values less than or equal to blood pool. Findings reflect response to therapy Deauville 1 in most areas, technically Deauville 2 in the LEFT groin. Some groin activity could potentially be low level reactive changes as well given location of these lymph nodes. 2. No new sites of nodal enlargement or hypermetabolic change. 3. Spleen normal size with uptake less than liver activity."  Lab results today (01/15/20) of CBC w/diff and CMP is as follows: all values are WNL except for WBC at 3.3K, RBC at 3.05, Hgb at 9.9, HCT at 28.9, Lymphs Abs at 0.2K, Sodium at 133, Glucose at 140.  On review of systems, pt reports healthy appetite and denies fevers, dizziness, dysgeusia and any other symptoms.   REVIEW OF SYSTEMS:   A 10+ POINT REVIEW OF SYSTEMS WAS OBTAINED including neurology, dermatology, psychiatry, cardiac, respiratory, lymph, extremities, GI, GU, Musculoskeletal, constitutional, breasts, reproductive, HEENT.  All pertinent positives are noted in the HPI.  All others are negative.   Past Medical History:  Diagnosis Date  . Allergy   . Arthritis   . Back pain   .  Cancer ([place])   . Complication of anesthesia    bp dropped yrs ago, recent surgeries went ok  . Dysrhythmia    occ, no meds for  . GERD (gastroesophageal reflux disease)   . H. pylori infection   . Headache    migraines occ  . Hiatal hernia   . Hx of adenomatous colonic polyps 11/30/2017  . Hyperlipidemia   . Hypothyroidism 1970's    hx of years ago, no meds for   . Shortness of breath dyspnea    with exertion, pt told comes from acid reflux    . Past Surgical History:  Procedure Laterality Date  . APPENDECTOMY    . COLONOSCOPY  12/07/2013  . DILATION AND CURETTAGE OF UTERUS    . LAPAROSCOPIC CHOLECYSTECTOMY SINGLE SITE WITH INTRAOPERATIVE CHOLANGIOGRAM N/A 10/28/2015   Procedure: LAPAROSCOPIC CHOLECYSTECTOMY WITH INTRAOPERATIVE CHOLANGIOGRAM;  Surgeon: Armandina Gemma, MD;  Location: [facility];  Service: General;  Laterality: N/A;  . LAPAROSCOPY  yrs ago   x 2  . TMJ ARTHROSCOPY    . TOTAL ABDOMINAL HYSTERECTOMY     complete    . Social History   Tobacco Use  . Smoking status: Never Smoker  . Smokeless tobacco: Never Used  Vaping Use  . Vaping Use: Never used  Substance Use Topics  . Alcohol use: Yes    Alcohol/week: 4.0 - 5.0 standard drinks    Types: 4 - 5 Cans of beer per week    Comment: 4-5 beers weekly-occ per pt  . Drug use: No    ALLERGIES:  has No Known Allergies.  MEDICATIONS:  Current Outpatient Medications  Medication Sig Dispense Refill  . acyclovir (ZOVIRAX) 400 MG tablet TAKE 1 TABLET BY MOUTH TWICE A DAY 30 tablet 3  . amoxicillin-clavulanate (AUGMENTIN) 875-125 MG tablet Take 1 tablet by mouth 2 (two) times daily. 10 tablet 0  . Cholecalciferol (VITAMIN D3) 5000 units CAPS Take 1 capsule by mouth daily.    . cyclobenzaprine (FLEXERIL) 10 MG tablet 10 mg.     . dexamethasone (DECADRON) 4 MG tablet Take 2 tablets (8 mg total) by mouth daily. Start 3 days before and continue 2 days after bendamustine/Rituxan chemotherapy for 2 days. Take with food. 30 tablet 1  . diclofenac Sodium (VOLTAREN) 1 % GEL Apply 1 application topically 4 (four) times daily.    [name] Kitchen dronabinol (MARINOL) 2.5 MG capsule Take 1 capsule (2.5 mg total) by mouth 2 (two) times daily before a meal. 60 capsule 0  . fluticasone (FLONASE) 50 MCG/ACT nasal spray Place into both nostrils.    [name] Kitchen HYDROcodone-acetaminophen  (NORCO/VICODIN) 5-325 MG tablet Take 1-2 tablets by mouth every 4 (four) hours as needed for moderate pain. 20 tablet 0  . ibuprofen (ADVIL,MOTRIN) 200 MG tablet Take 400 mg by mouth every 6 (six) hours as needed.    [name] Kitchen LORazepam (ATIVAN) 0.5 MG tablet Take 1 tablet (0.5 mg total) by mouth every 8 (eight) hours as needed for anxiety (Nausea or vomiting). 30 tablet 0  . nystatin (MYCOSTATIN) 100000 UNIT/ML suspension Take 5 mLs (500,000 Units total) by mouth 4 (four) times daily. 180 mL 0  . omeprazole (PRILOSEC) 20 MG capsule Take 20 mg by mouth every morning.    . ondansetron (ZOFRAN) 8 MG tablet Take 1 tablet (8 mg total) by mouth 2 (two) times daily as needed for refractory nausea / vomiting. Start on day 2 after bendamustine chemo. 30 tablet 1  . oxymetazoline (AFRIN) 0.05 % nasal spray Place 1  spray into both nostrils as needed for congestion.    . pantoprazole (PROTONIX) 40 MG tablet Take 1 tablet (40 mg total) by mouth every morning. Before breakfast (Patient taking differently: Take 40 mg by mouth every evening. Before breakfast) 90 tablet 1  . potassium chloride SA (KLOR-CON) 20 MEQ tablet Take 1 tablet (20 mEq total) by mouth daily. 30 tablet 1  . predniSONE (DELTASONE) 20 MG tablet Take 1 tablet (20 mg total) by mouth daily with breakfast. 5 tablet 0  . prochlorperazine (COMPAZINE) 10 MG tablet Take 1 tablet (10 mg total) by mouth every 6 (six) hours as needed (Nausea or vomiting). 30 tablet 1  . promethazine (PHENERGAN) 25 MG tablet Take 1 tablet (25 mg total) by mouth every 6 (six) hours as needed for nausea or vomiting. 10 tablet 0  . rosuvastatin (CRESTOR) 5 MG tablet Take 5 mg by mouth daily.     No current facility-administered medications for this visit.   Facility-Administered Medications Ordered in Other Visits  Medication Dose Route Frequency Provider Last Rate Last Admin  . dexamethasone (DECADRON) 10 mg in sodium chloride 0.9 % 50 mL IVPB  10 mg Intravenous Once Brunetta Genera, MD        PHYSICAL EXAMINATION:  Telehealth visit 01/15/2020  LABORATORY DATA:   I have reviewed the data as listed  . CBC Latest Ref Rng & Units 01/15/2020 01/07/2020 12/31/2019  WBC 4.0 - 10.5 K/uL 3.3(L) 8.0 10.8(H)  Hemoglobin 12.0 - 15.0 g/dL 9.9(L) 11.3(L) 10.6(L)  Hematocrit 36 - 46 % 28.9(L) 33.1(L) 31.5(L)  Platelets 150 - 400 K/uL 231 241 191    . CMP Latest Ref Rng & Units 01/15/2020 01/07/2020 12/31/2019  Glucose 70 - 99 mg/dL 140(H) 153(H) 101(H)  BUN 8 - 23 mg/dL 16 20 7(L)  Creatinine 0.44 - 1.00 mg/dL 0.90 0.99 0.85  Sodium 135 - 145 mmol/L 133(L) 137 138  Potassium 3.5 - 5.1 mmol/L 4.5 4.2 3.2(L)  Chloride 98 - 111 mmol/L 98 103 102  CO2 22 - 32 mmol/L 24 21(L) 24  Calcium 8.9 - 10.3 mg/dL 9.8 10.6(H) 9.5  Total Protein 6.5 - 8.1 g/dL 6.5 6.6 6.7  Total Bilirubin 0.3 - 1.2 mg/dL 0.4 0.5 0.4  Alkaline Phos 38 - 126 U/L 67 64 83  AST 15 - 41 U/L 22 10(L) 14(L)  ALT 0 - 44 U/L 20 6 9      RADIOGRAPHIC STUDIES: I have personally reviewed the radiological images as listed and agreed with the findings in the report. DG Chest 2 View  Result Date: 12/27/2019 CLINICAL DATA:  74 year old female with fever. EXAM: CHEST - 2 VIEW COMPARISON:  Chest radiograph dated 06/08/2017. FINDINGS: There is diffuse chronic interstitial coarsening and bronchitic changes. No focal consolidation, pleural effusion, pneumothorax. The cardiac silhouette is within limits. There is osteopenia with degenerative changes of the spine. Multiple old thoracic compression fractures. No acute osseous pathology. IMPRESSION: No acute cardiopulmonary process. Electronically Signed   By: Anner Crete M.D.   On: 12/27/2019 18:43   NM PET Image Restag (PS) Skull Base To Thigh  Result Date: 01/11/2020 CLINICAL DATA:  Subsequent treatment strategy for hematologic malignancy, mantle cell lymphoma in this 74 year old female assess treatment response. EXAM: NUCLEAR MEDICINE PET SKULL BASE TO  THIGH TECHNIQUE: 5.6 mCi F-18 FDG was injected intravenously. Full-ring PET imaging was performed from the skull base to thigh after the radiotracer. CT data was obtained and used for attenuation correction and anatomic localization. Fasting blood glucose:  88 mg/dl COMPARISON:  October 02, 2019 FINDINGS: Mediastinal blood pool activity: SUV max 1.86 Liver activity: SUV max 3.23 NECK: LEFT level 2 lymph node (image 31, series 4) 4 mm. Previously 8 mm. No sign of PET activity above mediastinal blood pool, maximum SUV of 1.7 no hypermetabolic lymph nodes in the neck. Incidental CT findings: none CHEST: No hypermetabolic mediastinal or hilar nodes. No suspicious pulmonary nodules on the CT scan. Mildly enlarged lymph nodes in the bilateral axilla seen on previous imaging have nearly completely resolved, barely visible on today's study. Faint area at the site of previous mildly hypermetabolic lymph node on image 65 of series 4 measuring approximately 4 mm previously approximately 9 mm no FDG uptake at this location. Another small lymph node in the RIGHT axilla that was seen previously has similarly resolved. LEFT axillary lymph node referenced on the previous scan that contained a biopsy clip has resolved leaving only the biopsy clip in place no metabolic activity remaining in this area to indicate residual disease. Incidental CT findings: Biapical scarring. No consolidation. No pleural effusion. Calcified atheromatous plaque in the thoracic aorta. Nonaneurysmal caliber. Calcified coronary artery disease. Normal caliber central pulmonary vasculature. Small pericardial effusion similar to the prior study. Limited assessment of cardiovascular structures given lack of intravenous contrast. ABDOMEN/PELVIS: Splenic activity less than liver activity. No hypermetabolic lymph nodes in the abdomen or pelvis with resolution, near complete resolution of LEFT common iliac lymph nodes indexed on the prior exam (image 142, series 4) 5  mm short axis as compared to 8 mm short axis on the prior study with a maximum SUV of 0.85. LEFT groin lymph nodes and RIGHT groin lymph nodes similarly improved maximum SUV of subcentimeter nodal tissue on the LEFT at 1.88 on image 179 of series 4 with similar decrease in size of bilateral pelvic lymph nodes as compared to previous imaging. Incidental CT findings: Cyst in the anterior LEFT hepatic lobe. Post cholecystectomy. No contour abnormality with respect to pancreas and no sign of inflammation. Spleen, adrenal glands and kidneys without acute process. Gastrointestinal tract grossly normal. Calcified atheromatous plaque in the abdominal aorta without aneurysmal dilation. Post hysterectomy without adnexal mass. SKELETON: No focal hypermetabolic activity to suggest skeletal metastasis. Incidental CT findings: Spinal degenerative changes. Similar appearance to prior imaging. IMPRESSION: 1. Resolution of nodal disease in the chest and abdomen, only very small lymph nodes are seen in the area of the LEFT pelvis and bilateral inguinal region. These lymph nodes display SUV values less than or equal to blood pool. Findings reflect response to therapy Deauville 1 in most areas, technically Deauville 2 in the LEFT groin. Some groin activity could potentially be low level reactive changes as well given location of these lymph nodes. 2. No new sites of nodal enlargement or hypermetabolic change. 3. Spleen normal size with uptake less than liver activity. Electronically Signed   By: Zetta Bills M.D.   On: 01/11/2020 09:38    ASSESSMENT & PLAN:   74 yo with   1) Stage III/IV Mantle cell lymphoma 2) Allergic reaction to Rapid Rituxan. - rash and low grade fever - nearly resolved with solumedrol/tylenol, benadryl and famotidine today PLAN: -Discussed pt labwork today, 01/15/20; mild leukopenia, moderate anemia, PLT are nml, blood chemistries look good.  -Discussed 01/11/2020 PET/CT ([id number])  which revealed  no visible disease, continued resolution. -The pt has no prohibitive toxicities from continuing C4D1 of BR at this time. -Will continue Singular with premeds. Add D4 Udenyca with each cycle. -  Advised pt that we plan to complete 5-6 cycles of BR.  -Will see back in 2 weeks with labs  -Continue Claritin nightly   FOLLOW UP: Labs and toxicity check with MD in 2 weeks  Plz schedule C5 of BR and udenyca as per orders. Labs and MD visit on C5D1   The total time spent in the appt was 15 minutes and more than 50% was on counseling and direct patient cares.  All of the patient's questions were answered with apparent satisfaction. The patient knows to call the clinic with any problems, questions or concerns.   Sullivan Lone MD [place] AAHIVMS [facility] Hematology/Oncology Physician [facility]  (Office):       [phone] (Work cell):  [phone] (Fax):           [phone]  I, Yevette Edwards, am acting as a scribe for Dr. Sullivan Lone.   .I have reviewed the above documentation for accuracy and completeness, and I agree with the above. Brunetta Genera MD

## 2020-01-15 NOTE — Progress Notes (Signed)
Patient has only had 1 day of dexamethasone at home. Give dexamethasone 20 mg IV today per Dr. Irene Limbo. Added to orders per his instructions.

## 2020-01-15 NOTE — Telephone Encounter (Signed)
Scheduled per providers request, left a voicemail regarding telephone visit.

## 2020-01-15 NOTE — Patient Instructions (Signed)
Pt discharged in no apparent distress. Pt left ambulatory without assistance. Pt aware of discharge instructions and verbalized understanding and had no further questions.  

## 2020-01-16 ENCOUNTER — Inpatient Hospital Stay: Payer: Medicare HMO

## 2020-01-16 VITALS — BP 112/69 | HR 88 | Temp 97.8°F | Resp 18

## 2020-01-16 DIAGNOSIS — D702 Other drug-induced agranulocytosis: Secondary | ICD-10-CM

## 2020-01-16 DIAGNOSIS — Z7189 Other specified counseling: Secondary | ICD-10-CM

## 2020-01-16 DIAGNOSIS — C8318 Mantle cell lymphoma, lymph nodes of multiple sites: Secondary | ICD-10-CM

## 2020-01-16 DIAGNOSIS — Z5112 Encounter for antineoplastic immunotherapy: Secondary | ICD-10-CM | POA: Diagnosis not present

## 2020-01-16 MED ORDER — SODIUM CHLORIDE 0.9 % IV SOLN
10.0000 mg | Freq: Once | INTRAVENOUS | Status: AC
  Administered 2020-01-16: 10 mg via INTRAVENOUS
  Filled 2020-01-16: qty 10

## 2020-01-16 MED ORDER — SODIUM CHLORIDE 0.9 % IV SOLN
Freq: Once | INTRAVENOUS | Status: AC
  Filled 2020-01-16: qty 250

## 2020-01-16 MED ORDER — SODIUM CHLORIDE 0.9 % IV SOLN
90.0000 mg/m2 | Freq: Once | INTRAVENOUS | Status: AC
  Administered 2020-01-16: 150 mg via INTRAVENOUS
  Filled 2020-01-16: qty 6

## 2020-01-16 MED ORDER — MONTELUKAST SODIUM 10 MG PO TABS
10.0000 mg | ORAL_TABLET | Freq: Once | ORAL | Status: AC
  Administered 2020-01-16: 10 mg via ORAL
  Filled 2020-01-16: qty 1

## 2020-01-16 NOTE — Patient Instructions (Signed)
Bendamustine Injection What is this medicine? BENDAMUSTINE (BEN da MUS teen) is a chemotherapy drug. It is used to treat chronic lymphocytic leukemia and non-Hodgkin lymphoma. This medicine may be used for other purposes; ask your health care provider or pharmacist if you have questions. COMMON BRAND NAME(S): Kristine Royal, Treanda What should I tell my health care provider before I take this medicine? They need to know if you have any of these conditions:  infection (especially a virus infection such as chickenpox, cold sores, or herpes)  kidney disease  liver disease  an unusual or allergic reaction to bendamustine, mannitol, other medicines, foods, dyes, or preservatives  pregnant or trying to get pregnant  breast-feeding How should I use this medicine? This medicine is for infusion into a vein. It is given by a health care professional in a hospital or clinic setting. Talk to your pediatrician regarding the use of this medicine in children. Special care may be needed. Overdosage: If you think you have taken too much of this medicine contact a poison control center or emergency room at once. NOTE: This medicine is only for you. Do not share this medicine with others. What if I miss a dose? It is important not to miss your dose. Call your doctor or health care professional if you are unable to keep an appointment. What may interact with this medicine? Do not take this medicine with any of the following medications:  clozapine This medicine may also interact with the following medications:  atazanavir  cimetidine  ciprofloxacin  enoxacin  fluvoxamine  medicines for seizures like carbamazepine and phenobarbital  mexiletine  rifampin  tacrine  thiabendazole  zileuton This list may not describe all possible interactions. Give your health care provider a list of all the medicines, herbs, non-prescription drugs, or dietary supplements you use. Also tell them if  you smoke, drink alcohol, or use illegal drugs. Some items may interact with your medicine. What should I watch for while using this medicine? This drug may make you feel generally unwell. This is not uncommon, as chemotherapy can affect healthy cells as well as cancer cells. Report any side effects. Continue your course of treatment even though you feel ill unless your doctor tells you to stop. You may need blood work done while you are taking this medicine. Call your doctor or healthcare provider for advice if you get a fever, chills or sore throat, or other symptoms of a cold or flu. Do not treat yourself. This drug decreases your body's ability to fight infections. Try to avoid being around people who are sick. This medicine may cause serious skin reactions. They can happen weeks to months after starting the medicine. Contact your healthcare provider right away if you notice fevers or flu-like symptoms with a rash. The rash may be red or purple and then turn into blisters or peeling of the skin. Or, you might notice a red rash with swelling of the face, lips or lymph nodes in your neck or under your arms. This medicine may increase your risk to bruise or bleed. Call your doctor or healthcare provider if you notice any unusual bleeding. Talk to your doctor about your risk of cancer. You may be more at risk for certain types of cancers if you take this medicine. Do not become pregnant while taking this medicine or for at least 6 months after stopping it. Women should inform their doctor if they wish to become pregnant or think they might be pregnant. Men should not  father a child while taking this medicine and for at least 3 months after stopping it. There is a potential for serious side effects to an unborn child. Talk to your healthcare provider or pharmacist for more information. Do not breast-feed an infant while taking this medicine or for at least 1 week after stopping it. This medicine may make it  more difficult to father a child. You should talk with your doctor or healthcare provider if you are concerned about your fertility. What side effects may I notice from receiving this medicine? Side effects that you should report to your doctor or health care professional as soon as possible:  allergic reactions like skin rash, itching or hives, swelling of the face, lips, or tongue  low blood counts - this medicine may decrease the number of white blood cells, red blood cells and platelets. You may be at increased risk for infections and bleeding.  rash, fever, and swollen lymph nodes  redness, blistering, peeling, or loosening of the skin, including inside the mouth  signs of infection like fever or chills, cough, sore throat, pain or difficulty passing urine  signs of decreased platelets or bleeding like bruising, pinpoint red spots on the skin, black, tarry stools, blood in the urine  signs of decreased red blood cells like being unusually weak or tired, fainting spells, lightheadedness  signs and symptoms of kidney injury like trouble passing urine or change in the amount of urine  signs and symptoms of liver injury like dark yellow or brown urine; general ill feeling or flu-like symptoms; light-colored stools; loss of appetite; nausea; right upper belly pain; unusually weak or tired; yellowing of the eyes or skin Side effects that usually do not require medical attention (report to your doctor or health care professional if they continue or are bothersome):  constipation  decreased appetite  diarrhea  headache  mouth sores  nausea, vomiting  tiredness This list may not describe all possible side effects. Call your doctor for medical advice about side effects. You may report side effects to FDA at 1-800-FDA-1088. Where should I keep my medicine? This drug is given in a hospital or clinic and will not be stored at home. NOTE: This sheet is a summary. It may not cover all  possible information. If you have questions about this medicine, talk to your doctor, pharmacist, or health care provider.  2020 Elsevier/Gold Standard (2018-04-18 10:26:46)

## 2020-01-16 NOTE — Progress Notes (Signed)
Pt discharged in no apparent distress. Pt left ambulatory without assistance. Pt aware of discharge instructions and verbalized understanding and had no further questions.  

## 2020-01-18 ENCOUNTER — Other Ambulatory Visit: Payer: Self-pay

## 2020-01-18 ENCOUNTER — Inpatient Hospital Stay: Payer: Medicare HMO

## 2020-01-18 ENCOUNTER — Other Ambulatory Visit: Payer: Self-pay | Admitting: Hematology

## 2020-01-18 VITALS — BP 143/82 | HR 76 | Resp 18

## 2020-01-18 DIAGNOSIS — Z7189 Other specified counseling: Secondary | ICD-10-CM

## 2020-01-18 DIAGNOSIS — Z5112 Encounter for antineoplastic immunotherapy: Secondary | ICD-10-CM | POA: Diagnosis not present

## 2020-01-18 DIAGNOSIS — C8318 Mantle cell lymphoma, lymph nodes of multiple sites: Secondary | ICD-10-CM

## 2020-01-18 DIAGNOSIS — D702 Other drug-induced agranulocytosis: Secondary | ICD-10-CM

## 2020-01-18 MED ORDER — PEGFILGRASTIM-CBQV 6 MG/0.6ML ~~LOC~~ SOSY
6.0000 mg | PREFILLED_SYRINGE | Freq: Once | SUBCUTANEOUS | Status: AC
  Administered 2020-01-18: 6 mg via SUBCUTANEOUS

## 2020-01-18 MED ORDER — PEGFILGRASTIM-CBQV 6 MG/0.6ML ~~LOC~~ SOSY
PREFILLED_SYRINGE | SUBCUTANEOUS | Status: AC
  Filled 2020-01-18: qty 0.6

## 2020-01-18 NOTE — Patient Instructions (Signed)

## 2020-01-30 ENCOUNTER — Other Ambulatory Visit: Payer: Self-pay

## 2020-01-30 ENCOUNTER — Inpatient Hospital Stay: Payer: Medicare HMO | Admitting: Hematology

## 2020-01-30 ENCOUNTER — Inpatient Hospital Stay: Payer: Medicare HMO

## 2020-01-30 VITALS — BP 108/76 | HR 107 | Temp 99.4°F | Resp 18 | Ht 63.0 in | Wt 114.0 lb

## 2020-01-30 DIAGNOSIS — Z5111 Encounter for antineoplastic chemotherapy: Secondary | ICD-10-CM | POA: Diagnosis not present

## 2020-01-30 DIAGNOSIS — C8318 Mantle cell lymphoma, lymph nodes of multiple sites: Secondary | ICD-10-CM

## 2020-01-30 DIAGNOSIS — Z5112 Encounter for antineoplastic immunotherapy: Secondary | ICD-10-CM | POA: Diagnosis not present

## 2020-01-30 LAB — CBC WITH DIFFERENTIAL/PLATELET
Abs Immature Granulocytes: 0.17 10*3/uL — ABNORMAL HIGH (ref 0.00–0.07)
Basophils Absolute: 0 10*3/uL (ref 0.0–0.1)
Basophils Relative: 1 %
Eosinophils Absolute: 0.1 10*3/uL (ref 0.0–0.5)
Eosinophils Relative: 1 %
HCT: 30 % — ABNORMAL LOW (ref 36.0–46.0)
Hemoglobin: 9.8 g/dL — ABNORMAL LOW (ref 12.0–15.0)
Immature Granulocytes: 2 %
Lymphocytes Relative: 3 %
Lymphs Abs: 0.2 10*3/uL — ABNORMAL LOW (ref 0.7–4.0)
MCH: 31.8 pg (ref 26.0–34.0)
MCHC: 32.7 g/dL (ref 30.0–36.0)
MCV: 97.4 fL (ref 80.0–100.0)
Monocytes Absolute: 1.2 10*3/uL — ABNORMAL HIGH (ref 0.1–1.0)
Monocytes Relative: 15 %
Neutro Abs: 6.4 10*3/uL (ref 1.7–7.7)
Neutrophils Relative %: 78 %
Platelets: 224 10*3/uL (ref 150–400)
RBC: 3.08 MIL/uL — ABNORMAL LOW (ref 3.87–5.11)
RDW: 15.7 % — ABNORMAL HIGH (ref 11.5–15.5)
WBC: 8.2 10*3/uL (ref 4.0–10.5)
nRBC: 0 % (ref 0.0–0.2)

## 2020-01-30 LAB — CMP (CANCER CENTER ONLY)
ALT: 12 U/L (ref 0–44)
AST: 21 U/L (ref 15–41)
Albumin: 3.4 g/dL — ABNORMAL LOW (ref 3.5–5.0)
Alkaline Phosphatase: 86 U/L (ref 38–126)
Anion gap: 9 (ref 5–15)
BUN: 9 mg/dL (ref 8–23)
CO2: 26 mmol/L (ref 22–32)
Calcium: 9.3 mg/dL (ref 8.9–10.3)
Chloride: 103 mmol/L (ref 98–111)
Creatinine: 0.81 mg/dL (ref 0.44–1.00)
GFR, Estimated: >60 mL/min (ref >60)
Glucose, Bld: 87 mg/dL (ref 70–99)
Potassium: 3.9 mmol/L (ref 3.5–5.1)
Sodium: 138 mmol/L (ref 135–145)
Total Bilirubin: 0.6 mg/dL (ref 0.3–1.2)
Total Protein: 6.7 g/dL (ref 6.5–8.1)

## 2020-01-30 LAB — SAMPLE TO BLOOD BANK

## 2020-01-30 NOTE — Progress Notes (Signed)
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[place])   10/19/2019 -  Chemotherapy   The patient had dexamethasone (DECADRON) 4 MG tablet, 8 mg, Oral, Daily, 1 of 1 cycle, Start date: 11/21/2019, End date: -- palonosetron (ALOXI) injection 0.25 mg, 0.25 mg, Intravenous,  Once, 4 of 6 cycles Administration: 0.25 mg (10/19/2019), 0.25 mg (11/14/2019), 0.25 mg (12/12/2019), 0.25 mg (11/23/2019), 0.25 mg (01/15/2020) pegfilgrastim-cbqv (UDENYCA) injection 6 mg, 6 mg, Subcutaneous, Once, 1 of 3 cycles Administration: 6 mg (01/18/2020) bendamustine (BENDEKA) 150 mg in sodium chloride 0.9 % 50 mL (2.6786 mg/mL) chemo infusion, 90 mg/m2 = 150 mg, Intravenous,  Once, 4 of 6 cycles Administration: 150 mg (10/19/2019), 150 mg (10/20/2019), 150 mg (11/14/2019), 150 mg (11/23/2019), 150 mg (12/12/2019), 150 mg (12/13/2019), 150 mg (01/15/2020), 150 mg (01/16/2020) riTUXimab-pvvr (RUXIENCE) 600 mg in sodium chloride 0.9 % 250 mL (1.9355 mg/mL) infusion, 375 mg/m2 = 600 mg, Intravenous,  Once, 4 of 6 cycles Administration: 600 mg (10/19/2019), 600 mg (12/12/2019), 600 mg (01/15/2020)  for chemotherapy treatment.      INTERVAL HISTORY:  Brooke [name] is a wonderful woman who is here for evaluation and management of Mantle Cell Lymphoma. We are joined today by her friend. The patient's last visit with [place] was on 01/15/2020. The pt reports that she is doing well overall.  The pt reports that she tolerated her last cycle of treatment better than previous cycles. She denies any new fevers or rashes.   Lab results today (01/30/20) of CBC w/diff and CMP is as follows: all values are WNL except for RBC at 3.08, Hgb at 9.8, HCT at  30.0, RDW at 15.7, Lymphs Abs at 0.2K, Mono Abs at 1.2K, Abs Immature Granulocytes at 0.17K, Albumin at 3.4.  On review of systems, pt denies rash, fevers, mouth sores, abdominal pain, dysuria, constipation, diarrhea, leg swelling and any other symptoms.   REVIEW OF SYSTEMS:   A 10+ POINT REVIEW OF SYSTEMS WAS OBTAINED including neurology, dermatology, psychiatry, cardiac, respiratory, lymph, extremities, GI, GU, Musculoskeletal, constitutional, breasts, reproductive, HEENT.  All pertinent positives are noted in the HPI.  All others are negative.   Past Medical History:  Diagnosis Date  . Allergy   . Arthritis   . Back pain   . Cancer ([place])   . Complication of anesthesia    bp dropped yrs ago, recent surgeries went ok  . Dysrhythmia    occ, no meds for  . GERD (gastroesophageal reflux disease)   . H. pylori infection   . Headache    migraines occ  . Hiatal hernia   . Hx of adenomatous colonic polyps 11/30/2017  . Hyperlipidemia   . Hypothyroidism 1970's   hx of years ago, no meds for   . Shortness of breath dyspnea    with exertion, pt told comes from acid reflux    . Past Surgical History:  Procedure Laterality Date  . APPENDECTOMY    . COLONOSCOPY  12/07/2013  . DILATION AND CURETTAGE OF UTERUS    . LAPAROSCOPIC CHOLECYSTECTOMY SINGLE SITE WITH INTRAOPERATIVE CHOLANGIOGRAM N/A 10/28/2015   Procedure: LAPAROSCOPIC CHOLECYSTECTOMY WITH INTRAOPERATIVE CHOLANGIOGRAM;  Surgeon: Armandina Gemma, MD;  Location: [facility];  Service: General;  Laterality: N/A;  . LAPAROSCOPY  yrs ago   x 2  . TMJ ARTHROSCOPY    . TOTAL ABDOMINAL HYSTERECTOMY     complete    . Social History   Tobacco Use  . Smoking status: Never Smoker  . Smokeless tobacco: Never Used  Vaping Use  . Vaping Use: Never used  Substance Use Topics  . Alcohol use: Yes    Alcohol/week: 4.0 - 5.0 standard drinks    Types: 4 - 5 Cans of beer per week    Comment: 4-5 beers weekly-occ per pt  . Drug use: No     ALLERGIES:  has No Known Allergies.  MEDICATIONS:  Current Outpatient Medications  Medication Sig Dispense Refill  . acyclovir (ZOVIRAX) 400 MG tablet TAKE 1 TABLET BY MOUTH TWICE A DAY 30 tablet 3  . amoxicillin-clavulanate (AUGMENTIN) 875-125 MG tablet Take 1 tablet by mouth 2 (two) times daily. 10 tablet 0  . Cholecalciferol (VITAMIN D3) 5000 units CAPS Take 1 capsule by mouth daily.    . cyclobenzaprine (FLEXERIL) 10 MG tablet 10 mg.     . dexamethasone (DECADRON) 4 MG tablet Take 2 tablets (8 mg total) by mouth daily. Start 3 days before and continue 2 days after bendamustine/Rituxan chemotherapy for 2 days. Take with food. 30 tablet 1  . diclofenac Sodium (VOLTAREN) 1 % GEL Apply 1 application topically 4 (four) times daily.    [name] Kitchen dronabinol (MARINOL) 2.5 MG capsule Take 1 capsule (2.5 mg total) by mouth 2 (two) times daily before a meal. 60 capsule 0  . fluticasone (FLONASE) 50 MCG/ACT nasal spray Place into both nostrils.    [name] Kitchen HYDROcodone-acetaminophen (NORCO/VICODIN) 5-325 MG tablet Take 1-2 tablets by mouth every 4 (four) hours as needed for moderate pain. 20 tablet 0  . ibuprofen (ADVIL,MOTRIN) 200 MG tablet Take 400 mg by mouth every 6 (six) hours as needed.    [name] Kitchen LORazepam (ATIVAN) 0.5 MG tablet Take 1 tablet (0.5 mg total) by mouth every 8 (eight) hours as needed for anxiety (Nausea or vomiting). 30 tablet 0  . nystatin (MYCOSTATIN) 100000 UNIT/ML suspension Take 5 mLs (500,000 Units total) by mouth 4 (four) times daily. 180 mL 0  . omeprazole (PRILOSEC) 20 MG capsule Take 20 mg by mouth every morning.    . ondansetron (ZOFRAN) 8 MG tablet Take 1 tablet (8 mg total) by mouth 2 (two) times daily as needed for refractory nausea / vomiting. Start on day 2 after bendamustine chemo. 30 tablet 1  . oxymetazoline (AFRIN) 0.05 % nasal spray Place 1 spray into both nostrils as needed for congestion.    . pantoprazole (PROTONIX) 40 MG tablet Take 1 tablet (40 mg total) by mouth every  morning. Before breakfast (Patient taking differently: Take 40 mg by mouth every evening. Before breakfast) 90 tablet 1  . potassium chloride SA (KLOR-CON) 20 MEQ tablet Take 1 tablet (20 mEq total) by mouth daily. 30 tablet 1  . predniSONE (DELTASONE) 20 MG tablet Take 1 tablet (20 mg total) by mouth daily with breakfast. 5 tablet 0  . prochlorperazine (COMPAZINE) 10 MG tablet Take 1 tablet (10 mg total) by mouth every 6 (six) hours as needed (Nausea or vomiting). 30 tablet 1  . promethazine (PHENERGAN) 25 MG tablet Take 1 tablet (25 mg total) by mouth every 6 (six) hours as needed for nausea or vomiting. 10 tablet 0  . rosuvastatin (CRESTOR) 5 MG tablet Take 5 mg by mouth daily.     No current  facility-administered medications for this visit.    PHYSICAL EXAMINATION:  GENERAL:alert, in no acute distress and comfortable SKIN: no acute rashes, no significant lesions EYES: conjunctiva are pink and non-injected, sclera anicteric OROPHARYNX: MMM, no exudates, no oropharyngeal erythema or ulceration NECK: supple, no JVD LYMPH:  no palpable lymphadenopathy in the cervical, axillary or inguinal regions LUNGS: clear to auscultation b/l with normal respiratory effort HEART: regular rate & rhythm ABDOMEN:  normoactive bowel sounds , non tender, not distended. No palpable hepatosplenomegaly.  Extremity: no pedal edema PSYCH: alert & oriented x 3 with fluent speech NEURO: no focal motor/sensory deficits  LABORATORY DATA:   I have reviewed the data as listed  . CBC Latest Ref Rng & Units 01/15/2020 01/07/2020 12/31/2019  WBC 4.0 - 10.5 K/uL 3.3(L) 8.0 10.8(H)  Hemoglobin 12.0 - 15.0 g/dL 9.9(L) 11.3(L) 10.6(L)  Hematocrit 36.0 - 46.0 % 28.9(L) 33.1(L) 31.5(L)  Platelets 150 - 400 K/uL 231 241 191    . CMP Latest Ref Rng & Units 01/15/2020 01/07/2020 12/31/2019  Glucose 70 - 99 mg/dL 140(H) 153(H) 101(H)  BUN 8 - 23 mg/dL 16 20 7(L)  Creatinine 0.44 - 1.00 mg/dL 0.90 0.99 0.85  Sodium 135  - 145 mmol/L 133(L) 137 138  Potassium 3.5 - 5.1 mmol/L 4.5 4.2 3.2(L)  Chloride 98 - 111 mmol/L 98 103 102  CO2 22 - 32 mmol/L 24 21(L) 24  Calcium 8.9 - 10.3 mg/dL 9.8 10.6(H) 9.5  Total Protein 6.5 - 8.1 g/dL 6.5 6.6 6.7  Total Bilirubin 0.3 - 1.2 mg/dL 0.4 0.5 0.4  Alkaline Phos 38 - 126 U/L 67 64 83  AST 15 - 41 U/L 22 10(L) 14(L)  ALT 0 - 44 U/L 20 6 9      RADIOGRAPHIC STUDIES: I have personally reviewed the radiological images as listed and agreed with the findings in the report. NM PET Image Restag (PS) Skull Base To Thigh  Result Date: 01/11/2020 CLINICAL DATA:  Subsequent treatment strategy for hematologic malignancy, mantle cell lymphoma in this 74 year old female assess treatment response. EXAM: NUCLEAR MEDICINE PET SKULL BASE TO THIGH TECHNIQUE: 5.6 mCi F-18 FDG was injected intravenously. Full-ring PET imaging was performed from the skull base to thigh after the radiotracer. CT data was obtained and used for attenuation correction and anatomic localization. Fasting blood glucose: 88 mg/dl COMPARISON:  October 02, 2019 FINDINGS: Mediastinal blood pool activity: SUV max 1.86 Liver activity: SUV max 3.23 NECK: LEFT level 2 lymph node (image 31, series 4) 4 mm. Previously 8 mm. No sign of PET activity above mediastinal blood pool, maximum SUV of 1.7 no hypermetabolic lymph nodes in the neck. Incidental CT findings: none CHEST: No hypermetabolic mediastinal or hilar nodes. No suspicious pulmonary nodules on the CT scan. Mildly enlarged lymph nodes in the bilateral axilla seen on previous imaging have nearly completely resolved, barely visible on today's study. Faint area at the site of previous mildly hypermetabolic lymph node on image 65 of series 4 measuring approximately 4 mm previously approximately 9 mm no FDG uptake at this location. Another small lymph node in the RIGHT axilla that was seen previously has similarly resolved. LEFT axillary lymph node referenced on the previous scan  that contained a biopsy clip has resolved leaving only the biopsy clip in place no metabolic activity remaining in this area to indicate residual disease. Incidental CT findings: Biapical scarring. No consolidation. No pleural effusion. Calcified atheromatous plaque in the thoracic aorta. Nonaneurysmal caliber. Calcified coronary artery disease. Normal caliber central pulmonary  vasculature. Small pericardial effusion similar to the prior study. Limited assessment of cardiovascular structures given lack of intravenous contrast. ABDOMEN/PELVIS: Splenic activity less than liver activity. No hypermetabolic lymph nodes in the abdomen or pelvis with resolution, near complete resolution of LEFT common iliac lymph nodes indexed on the prior exam (image 142, series 4) 5 mm short axis as compared to 8 mm short axis on the prior study with a maximum SUV of 0.85. LEFT groin lymph nodes and RIGHT groin lymph nodes similarly improved maximum SUV of subcentimeter nodal tissue on the LEFT at 1.88 on image 179 of series 4 with similar decrease in size of bilateral pelvic lymph nodes as compared to previous imaging. Incidental CT findings: Cyst in the anterior LEFT hepatic lobe. Post cholecystectomy. No contour abnormality with respect to pancreas and no sign of inflammation. Spleen, adrenal glands and kidneys without acute process. Gastrointestinal tract grossly normal. Calcified atheromatous plaque in the abdominal aorta without aneurysmal dilation. Post hysterectomy without adnexal mass. SKELETON: No focal hypermetabolic activity to suggest skeletal metastasis. Incidental CT findings: Spinal degenerative changes. Similar appearance to prior imaging. IMPRESSION: 1. Resolution of nodal disease in the chest and abdomen, only very small lymph nodes are seen in the area of the LEFT pelvis and bilateral inguinal region. These lymph nodes display SUV values less than or equal to blood pool. Findings reflect response to therapy [place]  1 in most areas, technically [place] 2 in the LEFT groin. Some groin activity could potentially be low level reactive changes as well given location of these lymph nodes. 2. No new sites of nodal enlargement or hypermetabolic change. 3. Spleen normal size with uptake less than liver activity. Electronically Signed   By: Zetta Bills M.D.   On: 01/11/2020 09:38    ASSESSMENT & PLAN:   74 yo with   1) Stage III/IV Mantle cell lymphoma 01/11/2020 PET/CT ([id number]) revealed no visible disease, continued resolution. 2) Allergic reaction to Rapid Rituxan. - rash and low grade fever - nearly resolved with solumedrol/tylenol, benadryl and famotidine today  PLAN: -Discussed pt labwork today, 01/30/20; WBC & PLT are nml, mild anemia, blood chemistries are nml. -Discussed again pertinent findings from 01/11/20 PET/CT. -The pt has no prohibitive toxicities from continuing C5D1 of BR at this time. -Will continue Singular with premeds and D4 Udenyca with each cycle. -Advised pt that we plan to complete up to 6 cycles of BR. Will re-evaluate after each cycle.  -Discussed CDC guidelines regarding the COVID19 booster. Will give in clinic today.  -Plan to repeat scans after C6 -Continue Claritin nightly -Will see back with C6D1   FOLLOW UP: Covid booster today F/u for C5 of BR as per scheduled appointments Plz schedule C6 of BR (D1,D2 and D4). Labs and MD visit on C6D1 of treatment.   The total time spent in the appt was 20 minutes and more than 50% was on counseling and direct patient cares.  All of the patient's questions were answered with apparent satisfaction. The patient knows to call the clinic with any problems, questions or concerns.   Sullivan Lone MD [place] AAHIVMS [facility]emat[facility]/Oncology Physician [facility]  (Office):       [phone] (Work cell):  [phone] (Fax):           [phone]  I, Yevette Edwards, am acting as a scribe for Dr. Sullivan Lone.    .I have reviewed the above documentation for accuracy and completeness, and I agree with the above. Cloria Spring  Irene Limbo MD

## 2020-01-31 ENCOUNTER — Telehealth: Payer: Self-pay | Admitting: Hematology

## 2020-01-31 NOTE — Telephone Encounter (Signed)
Scheduled per los, patient has been called and notified. 

## 2020-02-04 ENCOUNTER — Inpatient Hospital Stay: Payer: Medicare HMO

## 2020-02-04 ENCOUNTER — Other Ambulatory Visit: Payer: Self-pay

## 2020-02-04 DIAGNOSIS — Z23 Encounter for immunization: Secondary | ICD-10-CM | POA: Diagnosis present

## 2020-02-04 NOTE — Progress Notes (Signed)
Pt remained for 15 min post injection, tolerated well. 

## 2020-02-07 ENCOUNTER — Telehealth: Payer: Self-pay | Admitting: Hematology

## 2020-02-07 NOTE — Telephone Encounter (Signed)
Scheduled per 12/22 los, patient has been called and notified of upcoming appointments.

## 2020-02-11 NOTE — Progress Notes (Signed)
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)   10/19/2019 -  Chemotherapy   The patient had dexamethasone (DECADRON) 4 MG tablet, 8 mg, Oral, Daily, 1 of 1 cycle, Start date: 11/21/2019, End date: -- palonosetron (ALOXI) injection 0.25 mg, 0.25 mg, Intravenous,  Once, 4 of 5 cycles Administration: 0.25 mg (10/19/2019), 0.25 mg (11/14/2019), 0.25 mg (12/12/2019), 0.25 mg (11/23/2019), 0.25 mg (01/15/2020) pegfilgrastim-cbqv (UDENYCA) injection 6 mg, 6 mg, Subcutaneous, Once, 1 of 2 cycles Administration: 6 mg (01/18/2020) bendamustine (BENDEKA) 150 mg in sodium chloride 0.9 % 50 mL (2.6786 mg/mL) chemo infusion, 90 mg/m2 = 150 mg, Intravenous,  Once, 4 of 5 cycles Administration: 150 mg (10/19/2019), 150 mg (10/20/2019), 150 mg (11/14/2019), 150 mg (11/23/2019), 150 mg (12/12/2019), 150 mg (12/13/2019), 150 mg (01/15/2020), 150 mg (01/16/2020) riTUXimab-pvvr (RUXIENCE) 600 mg in sodium chloride 0.9 % 250 mL (1.9355 mg/mL) infusion, 375 mg/m2 = 600 mg, Intravenous,  Once, 4 of 5 cycles Administration: 600 mg (10/19/2019), 600 mg (12/12/2019), 600 mg (01/15/2020)  for chemotherapy treatment.      INTERVAL HISTORY: Brooke [name] is a wonderful woman who is here for evaluation and management of Mantle Cell Lymphoma. The patient's last visit with [place] was on 01/30/2020. The pt reports that she is doing well overall.  The pt reports that she has been feeling well over the last few days. The pt is taking half of a Xanax at bedtime to assist her with sleeplessness. She is currently well-rested. She experiences night sweats on the days that she takes Dexamethasone but none otherwise. Pt continues using Marinol  and notes weight gain.  Lab results today (02/12/20) of CBC w/diff and CMP is as follows: all values are WNL except for WBC at 2.5, RBC at 3.00, Hgb at 9.6, HCT at 28.3,Neutro Abs at 1.0K, Monocytes Abs at 1.1K, Lymph Abs at 0.4K, Glucose at 106, AST at 11.  On review of systems, pt reports night sweats and denies fevers, chills, rash, SOB, nausea, unexpected weight loss, back pain, abdominal pain, bowel habit changes, urinary habit changes and any other symptoms.    REVIEW OF SYSTEMS:   A 10+ POINT REVIEW OF SYSTEMS WAS OBTAINED including neurology, dermatology, psychiatry, cardiac, respiratory, lymph, extremities, GI, GU, Musculoskeletal, constitutional, breasts, reproductive, HEENT.  All pertinent positives are noted in the HPI.  All others are negative.   Past Medical History:  Diagnosis Date  . Allergy   . Arthritis   . Back pain   . Cancer (HCC)   . Complication of anesthesia    bp dropped yrs ago, recent surgeries went ok  . Dysrhythmia    occ, no meds for  . GERD (gastroesophageal reflux disease)   . H. pylori infection   . Headache    migraines occ  . Hiatal hernia   . Hx of adenomatous colonic polyps 11/30/2017  . Hyperlipidemia   . Hypothyroidism 1970's   hx of years ago, no meds for   . Shortness of breath dyspnea    with exertion, pt told comes from acid reflux    . Past Surgical History:  Procedure Laterality Date  . APPENDECTOMY    . COLONOSCOPY  12/07/2013  . DILATION AND  CURETTAGE OF UTERUS    . LAPAROSCOPIC CHOLECYSTECTOMY SINGLE SITE WITH INTRAOPERATIVE CHOLANGIOGRAM N/A 10/28/2015   Procedure: LAPAROSCOPIC CHOLECYSTECTOMY WITH INTRAOPERATIVE CHOLANGIOGRAM;  Surgeon: Armandina Gemma, MD;  Location: [facility];  Service: General;  Laterality: N/A;  . LAPAROSCOPY  yrs ago   x 2  . TMJ ARTHROSCOPY    . TOTAL ABDOMINAL HYSTERECTOMY     complete    . Social History   Tobacco Use  . Smoking status: Never Smoker  . Smokeless tobacco: Never Used  Vaping Use  .  Vaping Use: Never used  Substance Use Topics  . Alcohol use: Yes    Alcohol/week: 4.0 - 5.0 standard drinks    Types: 4 - 5 Cans of beer per week    Comment: 4-5 beers weekly-occ per pt  . Drug use: No    ALLERGIES:  has No Known Allergies.  MEDICATIONS:  Current Outpatient Medications  Medication Sig Dispense Refill  . acyclovir (ZOVIRAX) 400 MG tablet TAKE 1 TABLET BY MOUTH TWICE A DAY 30 tablet 3  . amoxicillin-clavulanate (AUGMENTIN) 875-125 MG tablet Take 1 tablet by mouth 2 (two) times daily. 10 tablet 0  . Cholecalciferol (VITAMIN D3) 5000 units CAPS Take 1 capsule by mouth daily.    . cyclobenzaprine (FLEXERIL) 10 MG tablet 10 mg.     . dexamethasone (DECADRON) 4 MG tablet Take 2 tablets (8 mg total) by mouth daily. Start 3 days before and continue 2 days after bendamustine/Rituxan chemotherapy for 2 days. Take with food. 30 tablet 1  . diclofenac Sodium (VOLTAREN) 1 % GEL Apply 1 application topically 4 (four) times daily.    Brooke [name] dronabinol (MARINOL) 2.5 MG capsule Take 1 capsule (2.5 mg total) by mouth 2 (two) times daily before a meal. 60 capsule 0  . fluticasone (FLONASE) 50 MCG/ACT nasal spray Place into both nostrils.    Brooke [name] HYDROcodone-acetaminophen (NORCO/VICODIN) 5-325 MG tablet Take 1-2 tablets by mouth every 4 (four) hours as needed for moderate pain. 20 tablet 0  . ibuprofen (ADVIL,MOTRIN) 200 MG tablet Take 400 mg by mouth every 6 (six) hours as needed.    Brooke [name] LORazepam (ATIVAN) 0.5 MG tablet Take 1 tablet (0.5 mg total) by mouth every 8 (eight) hours as needed for anxiety (Nausea or vomiting). 30 tablet 0  . nystatin (MYCOSTATIN) 100000 UNIT/ML suspension Take 5 mLs (500,000 Units total) by mouth 4 (four) times daily. 180 mL 0  . omeprazole (PRILOSEC) 20 MG capsule Take 20 mg by mouth every morning.    . ondansetron (ZOFRAN) 8 MG tablet Take 1 tablet (8 mg total) by mouth 2 (two) times daily as needed for refractory nausea / vomiting. Start on day 2 after bendamustine  chemo. 30 tablet 1  . oxymetazoline (AFRIN) 0.05 % nasal spray Place 1 spray into both nostrils as needed for congestion.    . pantoprazole (PROTONIX) 40 MG tablet Take 1 tablet (40 mg total) by mouth every morning. Before breakfast (Patient taking differently: Take 40 mg by mouth every evening. Before breakfast) 90 tablet 1  . potassium chloride SA (KLOR-CON) 20 MEQ tablet Take 1 tablet (20 mEq total) by mouth daily. 30 tablet 1  . predniSONE (DELTASONE) 20 MG tablet Take 1 tablet (20 mg total) by mouth daily with breakfast. 5 tablet 0  . prochlorperazine (COMPAZINE) 10 MG tablet Take 1 tablet (10 mg total) by mouth every 6 (six) hours as needed (Nausea or vomiting). 30 tablet 1  . promethazine (PHENERGAN) 25 MG tablet Take 1 tablet (  25 mg total) by mouth every 6 (six) hours as needed for nausea or vomiting. 10 tablet 0  . rosuvastatin (CRESTOR) 5 MG tablet Take 5 mg by mouth daily.     No current facility-administered medications for this visit.    PHYSICAL EXAMINATION:  GENERAL:alert, in no acute distress and comfortable SKIN: no acute rashes, no significant lesions EYES: conjunctiva are pink and non-injected, sclera anicteric OROPHARYNX: MMM, no exudates, no oropharyngeal erythema or ulceration NECK: supple, no JVD LYMPH:  no palpable lymphadenopathy in the cervical, axillary or inguinal regions LUNGS: clear to auscultation b/l with normal respiratory effort HEART: regular rate & rhythm ABDOMEN:  normoactive bowel sounds , non tender, not distended. No palpable hepatosplenomegaly.  Extremity: no pedal edema PSYCH: alert & oriented x 3 with fluent speech NEURO: no focal motor/sensory deficits  LABORATORY DATA:   I have reviewed the data as listed  . CBC Latest Ref Rng & Units 02/12/2020 01/30/2020 01/15/2020  WBC 4.0 - 10.5 K/uL 2.5(L) 8.2 3.3(L)  Hemoglobin 12.0 - 15.0 g/dL 9.6(L) 9.8(L) 9.9(L)  Hematocrit 36.0 - 46.0 % 28.3(L) 30.0(L) 28.9(L)  Platelets 150 - 400 K/uL 191 224  231    . CMP Latest Ref Rng & Units 02/12/2020 01/30/2020 01/15/2020  Glucose 70 - 99 mg/dL 106(H) 87 140(H)  BUN 8 - 23 mg/dL 22 9 16   Creatinine 0.44 - 1.00 mg/dL 0.80 0.81 0.90  Sodium 135 - 145 mmol/L 139 138 133(L)  Potassium 3.5 - 5.1 mmol/L 3.5 3.9 4.5  Chloride 98 - 111 mmol/L 106 103 98  CO2 22 - 32 mmol/L 23 26 24   Calcium 8.9 - 10.3 mg/dL 9.5 9.3 9.8  Total Protein 6.5 - 8.1 g/dL 6.5 6.7 6.5  Total Bilirubin 0.3 - 1.2 mg/dL 0.4 0.6 0.4  Alkaline Phos 38 - 126 U/L 63 86 67  AST 15 - 41 U/L 11(L) 21 22  ALT 0 - 44 U/L 10 12 20      RADIOGRAPHIC STUDIES: I have personally reviewed the radiological images as listed and agreed with the findings in the report. No results found.  ASSESSMENT & PLAN:   75 yo with   1) Stage III/IV Mantle cell lymphoma 01/11/2020 PET/CT ([id number]) revealed no visible disease, continued resolution. 2) Allergic reaction to Rapid Rituxan. - rash and low grade fever - nearly resolved with solumedrol/tylenol, benadryl and famotidine today  PLAN: -Discussed pt labwork today, 02/12/20; mild neutropenia & anemia, blood chemistries are nml. -The pt has no prohibitive toxicities from continuing C5D1 of BR at this time. ANC is 1000 but is okay to treat. -Will continue Singular with premeds and D4 Udenyca with each cycle. -Will hold C6 Bendamustine/Rituxan due to increased risk of infection and pt's exceptional response so far.  -Discussed continuing maintenance Rituxan after C5 to hold progression. -Recommend pt receive the annual flu vaccine. Will give in clinic today. -Recommend pt continue Lorazepam and hold Xanax at this time. -Recommend a daily multivitamin or B-complex vitamin -Continue 5000 IU Vitamin D daily. -Continue Claritin nightly -Will see back in 4 weeks with labs   FOLLOW UP: F/u as per scheduled appointment for labs and MD visit on 03/11/2020 Plz cancel C6 of BR scheduled for 2/1, 2/2 and 2/4.   The total time spent in the appt  was 30 minutes and more than 50% was on counseling and direct patient cares, ordering and management of chemotherapy  All of the patient's questions were answered with apparent satisfaction. The patient knows to call the  clinic with any problems, questions or concerns.    Sullivan Lone MD [place] AAHIVMS [facility] Hematology/Oncology Physician [facility]  (Office):       [phone] (Work cell):  [phone] (Fax):           [phone]  I, Yevette Edwards, am acting as a scribe for Dr. Sullivan Lone.   .I have reviewed the above documentation for accuracy and completeness, and I agree with the above. Brunetta Genera MD

## 2020-02-12 ENCOUNTER — Inpatient Hospital Stay (HOSPITAL_BASED_OUTPATIENT_CLINIC_OR_DEPARTMENT_OTHER): Payer: Medicare Other | Admitting: Hematology

## 2020-02-12 ENCOUNTER — Other Ambulatory Visit: Payer: Self-pay

## 2020-02-12 ENCOUNTER — Inpatient Hospital Stay: Payer: Medicare Other | Attending: Hematology

## 2020-02-12 ENCOUNTER — Inpatient Hospital Stay: Payer: Medicare Other

## 2020-02-12 VITALS — BP 142/90 | HR 73 | Temp 97.9°F | Resp 14 | Ht 63.0 in | Wt 117.8 lb

## 2020-02-12 VITALS — BP 145/86 | HR 72 | Temp 98.5°F | Resp 16

## 2020-02-12 DIAGNOSIS — Z23 Encounter for immunization: Secondary | ICD-10-CM | POA: Insufficient documentation

## 2020-02-12 DIAGNOSIS — C8318 Mantle cell lymphoma, lymph nodes of multiple sites: Secondary | ICD-10-CM | POA: Diagnosis not present

## 2020-02-12 DIAGNOSIS — Z5112 Encounter for antineoplastic immunotherapy: Secondary | ICD-10-CM | POA: Insufficient documentation

## 2020-02-12 DIAGNOSIS — D702 Other drug-induced agranulocytosis: Secondary | ICD-10-CM

## 2020-02-12 DIAGNOSIS — Z5189 Encounter for other specified aftercare: Secondary | ICD-10-CM | POA: Diagnosis not present

## 2020-02-12 DIAGNOSIS — Z7189 Other specified counseling: Secondary | ICD-10-CM

## 2020-02-12 DIAGNOSIS — Z5111 Encounter for antineoplastic chemotherapy: Secondary | ICD-10-CM | POA: Diagnosis not present

## 2020-02-12 DIAGNOSIS — C8594 Non-Hodgkin lymphoma, unspecified, lymph nodes of axilla and upper limb: Secondary | ICD-10-CM | POA: Diagnosis not present

## 2020-02-12 LAB — CMP (CANCER CENTER ONLY)
ALT: 10 U/L (ref 0–44)
AST: 11 U/L — ABNORMAL LOW (ref 15–41)
Albumin: 3.6 g/dL (ref 3.5–5.0)
Alkaline Phosphatase: 63 U/L (ref 38–126)
Anion gap: 10 (ref 5–15)
BUN: 22 mg/dL (ref 8–23)
CO2: 23 mmol/L (ref 22–32)
Calcium: 9.5 mg/dL (ref 8.9–10.3)
Chloride: 106 mmol/L (ref 98–111)
Creatinine: 0.8 mg/dL (ref 0.44–1.00)
GFR, Estimated: >60 mL/min (ref >60)
Glucose, Bld: 106 mg/dL — ABNORMAL HIGH (ref 70–99)
Potassium: 3.5 mmol/L (ref 3.5–5.1)
Sodium: 139 mmol/L (ref 135–145)
Total Bilirubin: 0.4 mg/dL (ref 0.3–1.2)
Total Protein: 6.5 g/dL (ref 6.5–8.1)

## 2020-02-12 LAB — CBC WITH DIFFERENTIAL/PLATELET
Abs Immature Granulocytes: 0.01 10*3/uL (ref 0.00–0.07)
Basophils Absolute: 0 10*3/uL (ref 0.0–0.1)
Basophils Relative: 1 %
Eosinophils Absolute: 0 10*3/uL (ref 0.0–0.5)
Eosinophils Relative: 0 %
HCT: 28.3 % — ABNORMAL LOW (ref 36.0–46.0)
Hemoglobin: 9.6 g/dL — ABNORMAL LOW (ref 12.0–15.0)
Immature Granulocytes: 0 %
Lymphocytes Relative: 15 %
Lymphs Abs: 0.4 10*3/uL — ABNORMAL LOW (ref 0.7–4.0)
MCH: 32 pg (ref 26.0–34.0)
MCHC: 33.9 g/dL (ref 30.0–36.0)
MCV: 94.3 fL (ref 80.0–100.0)
Monocytes Absolute: 1.1 10*3/uL — ABNORMAL HIGH (ref 0.1–1.0)
Monocytes Relative: 42 %
Neutro Abs: 1 10*3/uL — ABNORMAL LOW (ref 1.7–7.7)
Neutrophils Relative %: 42 %
Platelets: 191 10*3/uL (ref 150–400)
RBC: 3 MIL/uL — ABNORMAL LOW (ref 3.87–5.11)
RDW: 15.2 % (ref 11.5–15.5)
WBC: 2.5 10*3/uL — ABNORMAL LOW (ref 4.0–10.5)
nRBC: 0 % (ref 0.0–0.2)

## 2020-02-12 LAB — SAMPLE TO BLOOD BANK

## 2020-02-12 MED ORDER — DIPHENHYDRAMINE HCL 25 MG PO CAPS
ORAL_CAPSULE | ORAL | Status: AC
  Filled 2020-02-12: qty 2

## 2020-02-12 MED ORDER — SODIUM CHLORIDE 0.9 % IV SOLN
10.0000 mg | Freq: Once | INTRAVENOUS | Status: AC
  Administered 2020-02-12: 10 mg via INTRAVENOUS
  Filled 2020-02-12: qty 10

## 2020-02-12 MED ORDER — PALONOSETRON HCL INJECTION 0.25 MG/5ML
INTRAVENOUS | Status: AC
  Filled 2020-02-12: qty 5

## 2020-02-12 MED ORDER — SODIUM CHLORIDE 0.9 % IV SOLN
375.0000 mg/m2 | Freq: Once | INTRAVENOUS | Status: AC
  Administered 2020-02-12: 600 mg via INTRAVENOUS
  Filled 2020-02-12: qty 50

## 2020-02-12 MED ORDER — INFLUENZA VAC A&B SA ADJ QUAD 0.5 ML IM PRSY
0.5000 mL | PREFILLED_SYRINGE | Freq: Once | INTRAMUSCULAR | Status: AC
  Administered 2020-02-12: 0.5 mL via INTRAMUSCULAR

## 2020-02-12 MED ORDER — MONTELUKAST SODIUM 10 MG PO TABS
ORAL_TABLET | ORAL | Status: AC
  Filled 2020-02-12: qty 1

## 2020-02-12 MED ORDER — ACETAMINOPHEN 325 MG PO TABS
650.0000 mg | ORAL_TABLET | Freq: Once | ORAL | Status: AC
  Administered 2020-02-12: 650 mg via ORAL

## 2020-02-12 MED ORDER — PALONOSETRON HCL INJECTION 0.25 MG/5ML
0.2500 mg | Freq: Once | INTRAVENOUS | Status: AC
  Administered 2020-02-12: 0.25 mg via INTRAVENOUS

## 2020-02-12 MED ORDER — INFLUENZA VAC A&B SA ADJ QUAD 0.5 ML IM PRSY
PREFILLED_SYRINGE | INTRAMUSCULAR | Status: AC
  Filled 2020-02-12: qty 0.5

## 2020-02-12 MED ORDER — ACETAMINOPHEN 325 MG PO TABS
ORAL_TABLET | ORAL | Status: AC
  Filled 2020-02-12: qty 2

## 2020-02-12 MED ORDER — SODIUM CHLORIDE 0.9 % IV SOLN
Freq: Once | INTRAVENOUS | Status: AC
  Filled 2020-02-12: qty 250

## 2020-02-12 MED ORDER — SODIUM CHLORIDE 0.9 % IV SOLN
90.0000 mg/m2 | Freq: Once | INTRAVENOUS | Status: AC
  Administered 2020-02-12: 150 mg via INTRAVENOUS
  Filled 2020-02-12: qty 6

## 2020-02-12 MED ORDER — DIPHENHYDRAMINE HCL 25 MG PO CAPS
50.0000 mg | ORAL_CAPSULE | Freq: Once | ORAL | Status: AC
  Administered 2020-02-12: 50 mg via ORAL

## 2020-02-12 MED ORDER — MONTELUKAST SODIUM 10 MG PO TABS
10.0000 mg | ORAL_TABLET | Freq: Once | ORAL | Status: AC
  Administered 2020-02-12: 10 mg via ORAL

## 2020-02-12 MED ORDER — FAMOTIDINE IN NACL 20-0.9 MG/50ML-% IV SOLN
INTRAVENOUS | Status: AC
  Filled 2020-02-12: qty 50

## 2020-02-12 MED ORDER — FAMOTIDINE IN NACL 20-0.9 MG/50ML-% IV SOLN
20.0000 mg | Freq: Once | INTRAVENOUS | Status: AC
  Administered 2020-02-12: 20 mg via INTRAVENOUS

## 2020-02-12 MED FILL — POTASSIUM CHLORIDE CRYS ER: 20 | 30 days supply | Qty: 30 | Fill #1

## 2020-02-12 NOTE — Progress Notes (Signed)
Verbal order per Dr. Candise Che - [place] to receive treatment today with ANC 1000

## 2020-02-12 NOTE — Patient Instructions (Addendum)
[facility] Discharge Instructions for Patients Receiving Chemotherapy  Today you received the following chemotherapy agents: rituximab/bendamustine.  To help prevent nausea and vomiting after your treatment, we encourage you to take your nausea medication as directed.   If you develop nausea and vomiting that is not controlled by your nausea medication, call the clinic.   BELOW ARE SYMPTOMS THAT SHOULD BE REPORTED IMMEDIATELY:  *FEVER GREATER THAN 100.5 F  *CHILLS WITH OR WITHOUT FEVER  NAUSEA AND VOMITING THAT IS NOT CONTROLLED WITH YOUR NAUSEA MEDICATION  *UNUSUAL SHORTNESS OF BREATH  *UNUSUAL BRUISING OR BLEEDING  TENDERNESS IN MOUTH AND THROAT WITH OR WITHOUT PRESENCE OF ULCERS  *URINARY PROBLEMS  *BOWEL PROBLEMS  UNUSUAL RASH Items with * indicate a potential emergency and should be followed up as soon as possible.  Feel free to call the clinic should you have any questions or concerns. The clinic phone number is [phone].  Please show the CHEMO ALERT CARD at check-in to the Emergency Department and triage nurse.   Influenza Virus Vaccine injection What is this medicine? INFLUENZA VIRUS VACCINE (in floo EN zuh VAHY ruhs vak SEEN) helps to reduce the risk of getting influenza also known as the flu. The vaccine only helps protect you against some strains of the flu. This medicine may be used for other purposes; ask your health care provider or pharmacist if you have questions. COMMON BRAND NAME(S): Afluria, Afluria Quadrivalent, Agriflu, Alfuria, FLUAD, Fluarix, Fluarix Quadrivalent, Flublok, Flublok Quadrivalent, FLUCELVAX, FLUCELVAX Quadrivalent, Flulaval, Flulaval Quadrivalent, Fluvirin, Fluzone, Fluzone High-Dose, Fluzone Intradermal, Fluzone Quadrivalent What should I tell my health care provider before I take this medicine? They need to know if you have any of these conditions:  bleeding disorder like hemophilia  fever or  infection  Guillain-Barre syndrome or other neurological problems  immune system problems  infection with the human immunodeficiency virus (HIV) or AIDS  low blood platelet counts  multiple sclerosis  an unusual or allergic reaction to influenza virus vaccine, latex, other medicines, foods, dyes, or preservatives. Different brands of vaccines contain different allergens. Some may contain latex or eggs. Talk to your doctor about your allergies to make sure that you get the right vaccine.  pregnant or trying to get pregnant  breast-feeding How should I use this medicine? This vaccine is for injection into a muscle or under the skin. It is given by a health care professional. A copy of Vaccine Information Statements will be given before each vaccination. Read this sheet carefully each time. The sheet may change frequently. Talk to your healthcare provider to see which vaccines are right for you. Some vaccines should not be used in all age groups. Overdosage: If you think you have taken too much of this medicine contact a poison control center or emergency room at once. NOTE: This medicine is only for you. Do not share this medicine with others. What if I miss a dose? This does not apply. What may interact with this medicine?  chemotherapy or radiation therapy  medicines that lower your immune system like etanercept, anakinra, infliximab, and adalimumab  medicines that treat or prevent blood clots like warfarin  phenytoin  steroid medicines like prednisone or cortisone  theophylline  vaccines This list may not describe all possible interactions. Give your health care provider a list of all the medicines, herbs, non-prescription drugs, or dietary supplements you use. Also tell them if you smoke, drink alcohol, or use illegal drugs. Some items may interact with your medicine. What  should I watch for while using this medicine? Report any side effects that do not go away within 3  days to your doctor or health care professional. Call your health care provider if any unusual symptoms occur within 6 weeks of receiving this vaccine. You may still catch the flu, but the illness is not usually as bad. You cannot get the flu from the vaccine. The vaccine will not protect against colds or other illnesses that may cause fever. The vaccine is needed every year. What side effects may I notice from receiving this medicine? Side effects that you should report to your doctor or health care professional as soon as possible:  allergic reactions like skin rash, itching or hives, swelling of the face, lips, or tongue Side effects that usually do not require medical attention (report to your doctor or health care professional if they continue or are bothersome):  fever  headache  muscle aches and pains  pain, tenderness, redness, or swelling at the injection site  tiredness This list may not describe all possible side effects. Call your doctor for medical advice about side effects. You may report side effects to FDA at 1-800-FDA-1088. Where should I keep my medicine? The vaccine will be given by a health care professional in a clinic, pharmacy, doctor's office, or other health care setting. You will not be given vaccine doses to store at home. NOTE: This sheet is a summary. It may not cover all possible information. If you have questions about this medicine, talk to your doctor, pharmacist, or health care provider.  2020 Elsevier/Gold Standard (2017-12-20 08:45:43)

## 2020-02-13 ENCOUNTER — Other Ambulatory Visit: Payer: Self-pay

## 2020-02-13 ENCOUNTER — Inpatient Hospital Stay: Payer: Medicare Other

## 2020-02-13 VITALS — BP 140/80 | HR 76 | Temp 97.9°F | Resp 18

## 2020-02-13 DIAGNOSIS — Z5112 Encounter for antineoplastic immunotherapy: Secondary | ICD-10-CM | POA: Diagnosis not present

## 2020-02-13 DIAGNOSIS — C8594 Non-Hodgkin lymphoma, unspecified, lymph nodes of axilla and upper limb: Secondary | ICD-10-CM | POA: Diagnosis not present

## 2020-02-13 DIAGNOSIS — Z5189 Encounter for other specified aftercare: Secondary | ICD-10-CM | POA: Diagnosis not present

## 2020-02-13 DIAGNOSIS — Z7189 Other specified counseling: Secondary | ICD-10-CM

## 2020-02-13 DIAGNOSIS — Z5111 Encounter for antineoplastic chemotherapy: Secondary | ICD-10-CM | POA: Diagnosis not present

## 2020-02-13 DIAGNOSIS — C8318 Mantle cell lymphoma, lymph nodes of multiple sites: Secondary | ICD-10-CM

## 2020-02-13 DIAGNOSIS — Z23 Encounter for immunization: Secondary | ICD-10-CM | POA: Diagnosis not present

## 2020-02-13 DIAGNOSIS — D702 Other drug-induced agranulocytosis: Secondary | ICD-10-CM

## 2020-02-13 MED ORDER — MONTELUKAST SODIUM 10 MG PO TABS
10.0000 mg | ORAL_TABLET | Freq: Once | ORAL | Status: AC
  Administered 2020-02-13: 10 mg via ORAL

## 2020-02-13 MED ORDER — SODIUM CHLORIDE 0.9 % IV SOLN
90.0000 mg/m2 | Freq: Once | INTRAVENOUS | Status: AC
  Administered 2020-02-13: 150 mg via INTRAVENOUS
  Filled 2020-02-13: qty 6

## 2020-02-13 MED ORDER — SODIUM CHLORIDE 0.9 % IV SOLN
10.0000 mg | Freq: Once | INTRAVENOUS | Status: AC
  Administered 2020-02-13: 10 mg via INTRAVENOUS
  Filled 2020-02-13: qty 10

## 2020-02-13 MED ORDER — SODIUM CHLORIDE 0.9 % IV SOLN
Freq: Once | INTRAVENOUS | Status: AC
  Filled 2020-02-13: qty 250

## 2020-02-13 MED ORDER — MONTELUKAST SODIUM 10 MG PO TABS
ORAL_TABLET | ORAL | Status: AC
  Filled 2020-02-13: qty 1

## 2020-02-13 NOTE — Patient Instructions (Signed)
[facility] Discharge Instructions for Patients Receiving Chemotherapy  Today you received the following chemotherapy agents bendamustine Verl Dicker)  To help prevent nausea and vomiting after your treatment, we encourage you to take your nausea medication as directed.  If you develop nausea and vomiting that is not controlled by your nausea medication, call the clinic.   BELOW ARE SYMPTOMS THAT SHOULD BE REPORTED IMMEDIATELY:  *FEVER GREATER THAN 100.5 F  *CHILLS WITH OR WITHOUT FEVER  NAUSEA AND VOMITING THAT IS NOT CONTROLLED WITH YOUR NAUSEA MEDICATION  *UNUSUAL SHORTNESS OF BREATH  *UNUSUAL BRUISING OR BLEEDING  TENDERNESS IN MOUTH AND THROAT WITH OR WITHOUT PRESENCE OF ULCERS  *URINARY PROBLEMS  *BOWEL PROBLEMS  UNUSUAL RASH Items with * indicate a potential emergency and should be followed up as soon as possible.  Feel free to call the clinic should you have any questions or concerns. The clinic phone number is [phone].  Please show the [place] at check-in to the Emergency Department and triage nurse.   Bendamustine Injection What is this medicine? BENDAMUSTINE (BEN da MUS teen) is a chemotherapy drug. It is used to treat chronic lymphocytic leukemia and non-Hodgkin lymphoma. This medicine may be used for other purposes; ask your health care provider or pharmacist if you have questions. COMMON BRAND NAME(S): Kristine Royal, Treanda What should I tell my health care provider before I take this medicine? They need to know if you have any of these conditions:  infection (especially a virus infection such as chickenpox, cold sores, or herpes)  kidney disease  liver disease  an unusual or allergic reaction to bendamustine, mannitol, other medicines, foods, dyes, or preservatives  pregnant or trying to get pregnant  breast-feeding How should I use this medicine? This medicine is for infusion into a vein. It is given by a health  care professional in a hospital or clinic setting. Talk to your pediatrician regarding the use of this medicine in children. Special care may be needed. Overdosage: If you think you have taken too much of this medicine contact a poison control center or emergency room at once. NOTE: This medicine is only for you. Do not share this medicine with others. What if I miss a dose? It is important not to miss your dose. Call your doctor or health care professional if you are unable to keep an appointment. What may interact with this medicine? Do not take this medicine with any of the following medications:  clozapine This medicine may also interact with the following medications:  atazanavir  cimetidine  ciprofloxacin  enoxacin  fluvoxamine  medicines for seizures like carbamazepine and phenobarbital  mexiletine  rifampin  tacrine  thiabendazole  zileuton This list may not describe all possible interactions. Give your health care provider a list of all the medicines, herbs, non-prescription drugs, or dietary supplements you use. Also tell them if you smoke, drink alcohol, or use illegal drugs. Some items may interact with your medicine. What should I watch for while using this medicine? This drug may make you feel generally unwell. This is not uncommon, as chemotherapy can affect healthy cells as well as cancer cells. Report any side effects. Continue your course of treatment even though you feel ill unless your doctor tells you to stop. You may need blood work done while you are taking this medicine. Call your doctor or healthcare provider for advice if you get a fever, chills or sore throat, or other symptoms of a cold or flu. Do  not treat yourself. This drug decreases your body's ability to fight infections. Try to avoid being around people who are sick. This medicine may cause serious skin reactions. They can happen weeks to months after starting the medicine. Contact your  healthcare provider right away if you notice fevers or flu-like symptoms with a rash. The rash may be red or purple and then turn into blisters or peeling of the skin. Or, you might notice a red rash with swelling of the face, lips or lymph nodes in your neck or under your arms. This medicine may increase your risk to bruise or bleed. Call your doctor or healthcare provider if you notice any unusual bleeding. Talk to your doctor about your risk of cancer. You may be more at risk for certain types of cancers if you take this medicine. Do not become pregnant while taking this medicine or for at least 6 months after stopping it. Women should inform their doctor if they wish to become pregnant or think they might be pregnant. Men should not father a child while taking this medicine and for at least 3 months after stopping it. There is a potential for serious side effects to an unborn child. Talk to your healthcare provider or pharmacist for more information. Do not breast-feed an infant while taking this medicine or for at least 1 week after stopping it. This medicine may make it more difficult to father a child. You should talk with your doctor or healthcare provider if you are concerned about your fertility. What side effects may I notice from receiving this medicine? Side effects that you should report to your doctor or health care professional as soon as possible:  allergic reactions like skin rash, itching or hives, swelling of the face, lips, or tongue  low blood counts - this medicine may decrease the number of white blood cells, red blood cells and platelets. You may be at increased risk for infections and bleeding.  rash, fever, and swollen lymph nodes  redness, blistering, peeling, or loosening of the skin, including inside the mouth  signs of infection like fever or chills, cough, sore throat, pain or difficulty passing urine  signs of decreased platelets or bleeding like bruising, pinpoint  red spots on the skin, black, tarry stools, blood in the urine  signs of decreased red blood cells like being unusually weak or tired, fainting spells, lightheadedness  signs and symptoms of kidney injury like trouble passing urine or change in the amount of urine  signs and symptoms of liver injury like dark yellow or brown urine; general ill feeling or flu-like symptoms; light-colored stools; loss of appetite; nausea; right upper belly pain; unusually weak or tired; yellowing of the eyes or skin Side effects that usually do not require medical attention (report to your doctor or health care professional if they continue or are bothersome):  constipation  decreased appetite  diarrhea  headache  mouth sores  nausea, vomiting  tiredness This list may not describe all possible side effects. Call your doctor for medical advice about side effects. You may report side effects to FDA at 1-800-FDA-1088. Where should I keep my medicine? This drug is given in a hospital or clinic and will not be stored at home. NOTE: This sheet is a summary. It may not cover all possible information. If you have questions about this medicine, talk to your doctor, pharmacist, or health care provider.  2020 Elsevier/Gold Standard (2018-04-18 10:26:46)

## 2020-02-14 ENCOUNTER — Telehealth: Payer: Self-pay | Admitting: Hematology

## 2020-02-14 NOTE — Telephone Encounter (Signed)
Scheduled per 01/04 los, patient has been called and notified of upcoming appointments. °

## 2020-02-15 ENCOUNTER — Telehealth: Payer: Self-pay | Admitting: *Deleted

## 2020-02-15 ENCOUNTER — Ambulatory Visit: Payer: Medicare HMO

## 2020-02-15 NOTE — Telephone Encounter (Signed)
Patient missed udenyca shot today. Had last chemo on 1/5. Was constipated, took laxative and due to urgency of BMs,did not want to leave house. Wants to know if she could get injection tomorrow? Dr. Irene Limbo informed.   Dr. Irene Limbo in agreement. Contacted patient, made appt for Saturday 1/8 at 1030. Patient verbalized understanding of time.

## 2020-02-16 ENCOUNTER — Inpatient Hospital Stay: Payer: Medicare Other

## 2020-02-16 ENCOUNTER — Other Ambulatory Visit: Payer: Self-pay

## 2020-02-16 VITALS — BP 93/61 | HR 86 | Temp 97.5°F | Resp 18

## 2020-02-16 DIAGNOSIS — D702 Other drug-induced agranulocytosis: Secondary | ICD-10-CM

## 2020-02-16 DIAGNOSIS — Z5112 Encounter for antineoplastic immunotherapy: Secondary | ICD-10-CM | POA: Diagnosis not present

## 2020-02-16 DIAGNOSIS — Z7189 Other specified counseling: Secondary | ICD-10-CM

## 2020-02-16 DIAGNOSIS — C8594 Non-Hodgkin lymphoma, unspecified, lymph nodes of axilla and upper limb: Secondary | ICD-10-CM | POA: Diagnosis not present

## 2020-02-16 DIAGNOSIS — Z23 Encounter for immunization: Secondary | ICD-10-CM | POA: Diagnosis not present

## 2020-02-16 DIAGNOSIS — C8318 Mantle cell lymphoma, lymph nodes of multiple sites: Secondary | ICD-10-CM

## 2020-02-16 DIAGNOSIS — Z5111 Encounter for antineoplastic chemotherapy: Secondary | ICD-10-CM | POA: Diagnosis not present

## 2020-02-16 DIAGNOSIS — Z5189 Encounter for other specified aftercare: Secondary | ICD-10-CM | POA: Diagnosis not present

## 2020-02-16 MED ORDER — PEGFILGRASTIM-CBQV 6 MG/0.6ML ~~LOC~~ SOSY
6.0000 mg | PREFILLED_SYRINGE | Freq: Once | SUBCUTANEOUS | Status: AC
  Administered 2020-02-16: 6 mg via SUBCUTANEOUS

## 2020-02-16 NOTE — Patient Instructions (Signed)

## 2020-02-18 ENCOUNTER — Other Ambulatory Visit: Payer: Self-pay | Admitting: Hematology

## 2020-02-18 ENCOUNTER — Other Ambulatory Visit: Payer: Self-pay | Admitting: Medical

## 2020-02-18 NOTE — Telephone Encounter (Signed)
Pt is requesting refill of this medication

## 2020-02-19 ENCOUNTER — Other Ambulatory Visit: Payer: Self-pay

## 2020-02-19 ENCOUNTER — Emergency Department (HOSPITAL_COMMUNITY): Payer: Medicare Other

## 2020-02-19 ENCOUNTER — Encounter (HOSPITAL_COMMUNITY): Payer: Self-pay | Admitting: *Deleted

## 2020-02-19 ENCOUNTER — Inpatient Hospital Stay (HOSPITAL_COMMUNITY)
Admission: EM | Admit: 2020-02-19 | Discharge: 2020-02-23 | DRG: 194 | Disposition: A | Discharge status: Home / Self Care | Payer: Medicare Other | Attending: Internal Medicine | Admitting: Internal Medicine

## 2020-02-19 ENCOUNTER — Other Ambulatory Visit: Payer: Self-pay | Admitting: Hematology

## 2020-02-19 ENCOUNTER — Telehealth: Payer: Self-pay | Admitting: *Deleted

## 2020-02-19 DIAGNOSIS — K219 Gastro-esophageal reflux disease without esophagitis: Secondary | ICD-10-CM | POA: Diagnosis present

## 2020-02-19 DIAGNOSIS — Z801 Family history of malignant neoplasm of trachea, bronchus and lung: Secondary | ICD-10-CM | POA: Diagnosis not present

## 2020-02-19 DIAGNOSIS — Z20822 Contact with and (suspected) exposure to covid-19: Secondary | ICD-10-CM | POA: Diagnosis present

## 2020-02-19 DIAGNOSIS — R000 Tachycardia, unspecified: Secondary | ICD-10-CM | POA: Diagnosis not present

## 2020-02-19 DIAGNOSIS — R531 Weakness: Secondary | ICD-10-CM | POA: Diagnosis not present

## 2020-02-19 DIAGNOSIS — Z803 Family history of malignant neoplasm of breast: Secondary | ICD-10-CM

## 2020-02-19 DIAGNOSIS — Z8371 Family history of colonic polyps: Secondary | ICD-10-CM | POA: Diagnosis not present

## 2020-02-19 DIAGNOSIS — R109 Unspecified abdominal pain: Secondary | ICD-10-CM | POA: Diagnosis not present

## 2020-02-19 DIAGNOSIS — Z8041 Family history of malignant neoplasm of ovary: Secondary | ICD-10-CM

## 2020-02-19 DIAGNOSIS — D701 Agranulocytosis secondary to cancer chemotherapy: Secondary | ICD-10-CM | POA: Diagnosis not present

## 2020-02-19 DIAGNOSIS — E876 Hypokalemia: Secondary | ICD-10-CM | POA: Diagnosis not present

## 2020-02-19 DIAGNOSIS — A419 Sepsis, unspecified organism: Secondary | ICD-10-CM | POA: Diagnosis not present

## 2020-02-19 DIAGNOSIS — Z800 Family history of malignant neoplasm of digestive organs: Secondary | ICD-10-CM

## 2020-02-19 DIAGNOSIS — C8318 Mantle cell lymphoma, lymph nodes of multiple sites: Secondary | ICD-10-CM | POA: Diagnosis present

## 2020-02-19 DIAGNOSIS — E785 Hyperlipidemia, unspecified: Secondary | ICD-10-CM | POA: Diagnosis present

## 2020-02-19 DIAGNOSIS — Z7952 Long term (current) use of systemic steroids: Secondary | ICD-10-CM | POA: Diagnosis not present

## 2020-02-19 DIAGNOSIS — Z681 Body mass index (BMI) 19 or less, adult: Secondary | ICD-10-CM

## 2020-02-19 DIAGNOSIS — R509 Fever, unspecified: Secondary | ICD-10-CM | POA: Diagnosis not present

## 2020-02-19 DIAGNOSIS — Z79899 Other long term (current) drug therapy: Secondary | ICD-10-CM

## 2020-02-19 DIAGNOSIS — R21 Rash and other nonspecific skin eruption: Secondary | ICD-10-CM | POA: Diagnosis not present

## 2020-02-19 DIAGNOSIS — R630 Anorexia: Secondary | ICD-10-CM | POA: Diagnosis present

## 2020-02-19 DIAGNOSIS — T451X5A Adverse effect of antineoplastic and immunosuppressive drugs, initial encounter: Secondary | ICD-10-CM | POA: Diagnosis present

## 2020-02-19 DIAGNOSIS — R1084 Generalized abdominal pain: Secondary | ICD-10-CM | POA: Diagnosis not present

## 2020-02-19 DIAGNOSIS — J189 Pneumonia, unspecified organism: Principal | ICD-10-CM

## 2020-02-19 DIAGNOSIS — D709 Neutropenia, unspecified: Secondary | ICD-10-CM | POA: Insufficient documentation

## 2020-02-19 DIAGNOSIS — Z8051 Family history of malignant neoplasm of kidney: Secondary | ICD-10-CM | POA: Diagnosis not present

## 2020-02-19 DIAGNOSIS — Y929 Unspecified place or not applicable: Secondary | ICD-10-CM

## 2020-02-19 DIAGNOSIS — D702 Other drug-induced agranulocytosis: Secondary | ICD-10-CM | POA: Diagnosis present

## 2020-02-19 DIAGNOSIS — I517 Cardiomegaly: Secondary | ICD-10-CM | POA: Diagnosis not present

## 2020-02-19 DIAGNOSIS — R5081 Fever presenting with conditions classified elsewhere: Secondary | ICD-10-CM | POA: Insufficient documentation

## 2020-02-19 LAB — CBC WITH DIFFERENTIAL/PLATELET
Abs Immature Granulocytes: 0.27 10*3/uL — ABNORMAL HIGH (ref 0.00–0.07)
Basophils Absolute: 0.1 10*3/uL (ref 0.0–0.1)
Basophils Relative: 2 %
Eosinophils Absolute: 0.1 10*3/uL (ref 0.0–0.5)
Eosinophils Relative: 3 %
HCT: 29.6 % — ABNORMAL LOW (ref 36.0–46.0)
Hemoglobin: 9.9 g/dL — ABNORMAL LOW (ref 12.0–15.0)
Immature Granulocytes: 10 %
Lymphocytes Relative: 2 %
Lymphs Abs: 0.1 10*3/uL — ABNORMAL LOW (ref 0.7–4.0)
MCH: 32.2 pg (ref 26.0–34.0)
MCHC: 33.4 g/dL (ref 30.0–36.0)
MCV: 96.4 fL (ref 80.0–100.0)
Monocytes Absolute: 1 10*3/uL (ref 0.1–1.0)
Monocytes Relative: 39 %
Neutro Abs: 1.2 10*3/uL — ABNORMAL LOW (ref 1.7–7.7)
Neutrophils Relative %: 44 %
Platelets: 164 10*3/uL (ref 150–400)
RBC: 3.07 MIL/uL — ABNORMAL LOW (ref 3.87–5.11)
RDW: 15.3 % (ref 11.5–15.5)
WBC: 2.7 10*3/uL — ABNORMAL LOW (ref 4.0–10.5)
nRBC: 0 % (ref 0.0–0.2)

## 2020-02-19 LAB — URINALYSIS, ROUTINE W REFLEX MICROSCOPIC
Bilirubin Urine: NEGATIVE
Glucose, UA: NEGATIVE mg/dL
Hgb urine dipstick: NEGATIVE
Ketones, ur: NEGATIVE mg/dL
Leukocytes,Ua: NEGATIVE
Nitrite: NEGATIVE
Protein, ur: NEGATIVE mg/dL
Specific Gravity, Urine: 1.003 — ABNORMAL LOW (ref 1.005–1.030)
pH: 7 (ref 5.0–8.0)

## 2020-02-19 LAB — LACTIC ACID, PLASMA: Lactic Acid, Venous: 1.5 mmol/L (ref 0.5–1.9)

## 2020-02-19 LAB — COMPREHENSIVE METABOLIC PANEL
ALT: 10 U/L (ref 0–44)
AST: 15 U/L (ref 15–41)
Albumin: 3.3 g/dL — ABNORMAL LOW (ref 3.5–5.0)
Alkaline Phosphatase: 57 U/L (ref 38–126)
Anion gap: 13 (ref 5–15)
BUN: 10 mg/dL (ref 8–23)
CO2: 25 mmol/L (ref 22–32)
Calcium: 8.6 mg/dL — ABNORMAL LOW (ref 8.9–10.3)
Chloride: 98 mmol/L (ref 98–111)
Creatinine, Ser: 0.87 mg/dL (ref 0.44–1.00)
GFR, Estimated: >60 mL/min (ref >60)
Glucose, Bld: 90 mg/dL (ref 70–99)
Potassium: 2.6 mmol/L — CL (ref 3.5–5.1)
Sodium: 136 mmol/L (ref 135–145)
Total Bilirubin: 0.9 mg/dL (ref 0.3–1.2)
Total Protein: 6 g/dL — ABNORMAL LOW (ref 6.5–8.1)

## 2020-02-19 LAB — PROTIME-INR
INR: 0.9 (ref 0.8–1.2)
Prothrombin Time: 12 seconds (ref 11.4–15.2)

## 2020-02-19 LAB — APTT: aPTT: 29 seconds (ref 24–36)

## 2020-02-19 LAB — RESP PANEL BY RT-PCR (FLU A&B, COVID) ARPGX2
Influenza A by PCR: NEGATIVE
Influenza B by PCR: NEGATIVE
SARS Coronavirus 2 by RT PCR: NEGATIVE

## 2020-02-19 MED ORDER — MAGIC MOUTHWASH
10.0000 mL | Freq: Once | ORAL | Status: AC
  Administered 2020-02-19: 10 mL via ORAL
  Filled 2020-02-19: qty 10

## 2020-02-19 MED ORDER — DOXYCYCLINE HYCLATE 100 MG PO TABS
100.0000 mg | ORAL_TABLET | Freq: Once | ORAL | Status: AC
  Administered 2020-02-19: 100 mg via ORAL
  Filled 2020-02-19: qty 1

## 2020-02-19 MED ORDER — LACTATED RINGERS IV BOLUS (SEPSIS)
1000.0000 mL | Freq: Once | INTRAVENOUS | Status: AC
  Administered 2020-02-19: 1000 mL via INTRAVENOUS

## 2020-02-19 MED ORDER — POTASSIUM CHLORIDE 10 MEQ/100ML IV SOLN
10.0000 meq | INTRAVENOUS | Status: AC
  Administered 2020-02-19: 10 meq via INTRAVENOUS
  Filled 2020-02-19: qty 100

## 2020-02-19 MED ORDER — SODIUM CHLORIDE 0.9 % IV SOLN
2.0000 g | Freq: Once | INTRAVENOUS | Status: AC
  Administered 2020-02-19: 2 g via INTRAVENOUS
  Filled 2020-02-19: qty 2

## 2020-02-19 MED FILL — NYSTATIN 100,000 UNITS/ML S: 100000 | 9 days supply | Qty: 180 | Fill #0

## 2020-02-19 NOTE — Telephone Encounter (Signed)
Patient called - nauseated with occasional diarrhea (not frequent). Poor appetite. Drinking fluids as tolerated. Tongue white/coated, using Magic mouthwash. Feels weak at times.   Temp up to 100.9 in evenings, decreases with tylenol. She is on Rituxan/Bendeka, infusion last week with Udenyca on Saturday. Dr. Irene Limbo informed Contacted patient with Dr. Grier Mitts response: Given her neutropenia would recommend going to ED for septic workup and covid testing.  Patient verbalized understanding. Reminded patient to give ED staff her blue chemo card.

## 2020-02-19 NOTE — ED Provider Notes (Signed)
Brooke [name]   CSN: [id number] Arrival date & time: 02/19/20  1637     History Chief Complaint  Patient presents with  . Fever  . Nausea  . Weakness    Brooke [name] is a 75 y.o. female.  The history is provided by the patient and medical records.  Fever Weakness Associated symptoms: fever    Brooke [name] is a 75 y.o. female who presents to the Emergency Department complaining of fever. She has been feeling unwell for the last week.  Had constipation initially and took a laxative followed by diarrhea (last episode yesterday). Has abdominal discomfort.   She had a fever starting three days ago.  Fever up to 100.9 at night.  She has associated chills, night sweats.  Fever improves with tylenol.  Has poor appetite and nausea.  Had chemo one week ago.    Denies sore throat, cough, dysuria.  Has DOE.    Has been fully vaccinated for COVID 19 including booster shot two weeks ago.      Past Medical History:  Diagnosis Date  . Allergy   . Arthritis   . Back pain   . Cancer ([place])   . Complication of anesthesia    bp dropped yrs ago, recent surgeries went ok  . Dysrhythmia    occ, no meds for  . GERD (gastroesophageal reflux disease)   . H. pylori infection   . Headache    migraines occ  . Hiatal hernia   . Hx of adenomatous colonic polyps 11/30/2017  . Hyperlipidemia   . Hypothyroidism 1970's   hx of years ago, no meds for   . Shortness of breath dyspnea    with exertion, pt told comes from acid reflux    Patient Active Problem List   Diagnosis Date Noted  . Hypokalemia 02/19/2020  . CAP (community acquired pneumonia) 02/19/2020  . Neutropenia with fever ([place]) 02/19/2020  . Drug-induced neutropenia ([place]) 12/28/2019  . 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 Counseling regarding advance care planning and goals of care 10/08/2019  . Hx of adenomatous  colonic polyps 11/14/2017  . Cholelithiasis with chronic cholecystitis 10/27/2015  . GERD 05/28/2008  . ESOPHAGEAL STRICTURE 05/27/2008    Past Surgical History:  Procedure Laterality Date  . APPENDECTOMY    . COLONOSCOPY  12/07/2013  . DILATION AND CURETTAGE OF UTERUS    . LAPAROSCOPIC CHOLECYSTECTOMY SINGLE SITE WITH INTRAOPERATIVE CHOLANGIOGRAM N/A 10/28/2015   Procedure: LAPAROSCOPIC CHOLECYSTECTOMY WITH INTRAOPERATIVE CHOLANGIOGRAM;  Surgeon: Armandina Gemma, MD;  Location: [facility];  Service: General;  Laterality: N/A;  . LAPAROSCOPY  yrs ago   x 2  . TMJ ARTHROSCOPY    . TOTAL ABDOMINAL HYSTERECTOMY     complete     OB History   No obstetric history on file.     Family History  Problem Relation Age of Onset  . Colon cancer Father        early 20's  . Ovarian cancer Sister   . Kidney cancer Brother   . Colon polyps Brother   . Colon cancer Paternal Grandfather        unknown age of onset  . Lung cancer Brother   . Colon polyps Sister   . Breast cancer Maternal Aunt   . Breast cancer Cousin   . Rectal cancer Neg Hx   . Stomach cancer Neg Hx   . Esophageal cancer Neg Hx  Social History   Tobacco Use  . Smoking status: Never Smoker  . Smokeless tobacco: Never Used  Vaping Use  . Vaping Use: Never used  Substance Use Topics  . Alcohol use: Yes    Alcohol/week: 4.0 - 5.0 standard drinks    Types: 4 - 5 Cans of beer per week    Comment: 4-5 beers weekly-occ per pt  . Drug use: No    Home Medications Prior to Admission medications   Medication Sig Start Date End Date Taking? Authorizing Provider  acetaminophen (TYLENOL) 500 MG tablet Take 1,000 mg by mouth every 6 (six) hours as needed for moderate pain.   Yes [provider]  Cholecalciferol (VITAMIN D3) 5000 units CAPS Take 1 capsule by mouth daily.   Yes [provider]  cyclobenzaprine (FLEXERIL) 10 MG tablet Take 10 mg by mouth daily as needed for muscle spasms.   Yes [provider]  dexamethasone (DECADRON) 4 MG tablet Take 2 tablets (8 mg total) by mouth daily. Start 3 days before and continue 2 days after bendamustine/Rituxan chemotherapy for 2 days. Take with food. 11/21/19  Yes Brunetta Genera, MD  diclofenac Sodium (VOLTAREN) 1 % GEL Apply 1 application topically 4 (four) times daily. 07/23/19  Yes [provider]  dronabinol (MARINOL) 2.5 MG capsule Take 1 capsule (2.5 mg total) by mouth 2 (two) times daily before a meal. Patient taking differently: Take 2.5 mg by mouth 2 (two) times daily as needed (nausea). 01/07/20  Yes Brunetta Genera, MD  fluticasone [facility]) 50 MCG/ACT nasal spray Place 1 spray into both nostrils daily as needed for allergies. 09/17/19  Yes [provider]  HYDROcodone-acetaminophen (NORCO/VICODIN) 5-325 MG tablet Take 1-2 tablets by mouth every 4 (four) hours as needed for moderate pain. 10/28/15  Yes Armandina Gemma, MD  LORazepam (ATIVAN) 0.5 MG tablet Take 1 tablet (0.5 mg total) by mouth every 8 (eight) hours as needed for anxiety (Nausea or vomiting). 01/07/20  Yes Brunetta Genera, MD  omeprazole (PRILOSEC) 20 MG capsule Take 20 mg by mouth every morning. 09/25/19  Yes [provider]  ondansetron (ZOFRAN) 8 MG tablet Take 1 tablet (8 mg total) by mouth 2 (two) times daily as needed for refractory nausea / vomiting. Start on day 2 after bendamustine chemo. 10/08/19  Yes Brunetta Genera, MD  potassium chloride SA (KLOR-CON) 20 MEQ tablet Take 1 tablet (20 mEq total) by mouth daily. 12/31/19  Yes Tanner, Lyndon Code., PA-C  rosuvastatin (CRESTOR) 5 MG tablet Take 5 mg by mouth daily.   Yes [provider]  acyclovir (ZOVIRAX) 400 MG tablet TAKE 1 TABLET BY MOUTH TWICE A DAY Patient not taking: No sig reported 12/27/19   Brunetta Genera, MD  amoxicillin-clavulanate (AUGMENTIN) 875-125 MG tablet Take 1 tablet by mouth 2 (two) times daily. Patient not taking: No sig reported 12/28/19    Harle Stanford., PA-C  nystatin (MYCOSTATIN) 100000 UNIT/ML suspension TAKE 5MLS BY MOUTH FOUR TIMES DAILY Patient taking differently: Take 5 mLs by mouth 3 (three) times daily as needed (infection). 02/18/20   Brunetta Genera, MD  pantoprazole (PROTONIX) 40 MG tablet Take 1 tablet (40 mg total) by mouth every morning. Before breakfast Patient not taking: Reported on 02/19/2020 08/19/15   Pyrtle, Lajuan Lines, MD  predniSONE (DELTASONE) 20 MG tablet Take 1 tablet (20 mg total) by mouth daily with breakfast. Patient not taking: No sig reported 11/15/19   Brunetta Genera, MD  prochlorperazine (COMPAZINE)  10 MG tablet Take 1 tablet (10 mg total) by mouth every 6 (six) hours as needed (Nausea or vomiting). Patient not taking: No sig reported 10/08/19   Brunetta Genera, MD  promethazine (PHENERGAN) 25 MG tablet Take 1 tablet (25 mg total) by mouth every 6 (six) hours as needed for nausea or vomiting. Patient not taking: No sig reported 10/29/15   Armandina Gemma, MD    Allergies    Patient has no known allergies.  Review of Systems   Review of Systems  Constitutional: Positive for fever.  Neurological: Positive for weakness.  All other systems reviewed and are negative.   Physical Exam Updated Vital Signs BP (!) 135/92 (BP Location: Left Arm)   Pulse 88   Temp 98.1 F (36.7 C) (Oral)   Resp 18   SpO2 99%   Physical Exam Vitals and nursing [name] reviewed.  Constitutional:      Appearance: She is well-developed and well-nourished.  HENT:     Head: Normocephalic and atraumatic.  Cardiovascular:     Rate and Rhythm: Regular rhythm. Tachycardia present.     Heart sounds: No murmur heard.   Pulmonary:     Effort: Pulmonary effort is normal. No respiratory distress.     Breath sounds: Normal breath sounds.  Abdominal:     Palpations: Abdomen is soft.     Tenderness: There is no guarding or rebound.     Comments: Mild generalized abdominal tenderness  Musculoskeletal:         General: No tenderness or edema.  Skin:    General: Skin is warm and dry.  Neurological:     Mental Status: She is alert and oriented to person, place, and time.  Psychiatric:        Mood and Affect: Mood and affect normal.        Behavior: Behavior normal.     ED Results / Procedures / Treatments   Labs (all labs ordered are listed, but only abnormal results are displayed) Labs Reviewed  COMPREHENSIVE METABOLIC PANEL - Abnormal; Notable for the following components:      Result Value   Potassium 2.6 (*)    Calcium 8.6 (*)    Total Protein 6.0 (*)    Albumin 3.3 (*)    All other components within normal limits  CBC WITH DIFFERENTIAL/PLATELET - Abnormal; Notable for the following components:   WBC 2.7 (*)    RBC 3.07 (*)    Hemoglobin 9.9 (*)    HCT 29.6 (*)    Neutro Abs 1.2 (*)    Lymphs Abs 0.1 (*)    Abs Immature Granulocytes 0.27 (*)    All other components within normal limits  URINALYSIS, ROUTINE W REFLEX MICROSCOPIC - Abnormal; Notable for the following components:   Color, Urine STRAW (*)    Specific Gravity, Urine 1.003 (*)    All other components within normal limits  RESP PANEL BY RT-PCR (FLU A&B, COVID) ARPGX2  URINE CULTURE  CULTURE, BLOOD (ROUTINE X 2)  CULTURE, BLOOD (ROUTINE X 2)  LACTIC ACID, PLASMA  PROTIME-INR  APTT  MAGNESIUM    EKG None  Radiology DG Chest Port 1 View  Result Date: 02/19/2020 CLINICAL DATA:  75 year old female with sepsis. EXAM: PORTABLE CHEST 1 VIEW COMPARISON:  Chest radiograph dated 12/27/2019. FINDINGS: Mild cardiomegaly. Faint left perihilar density may represent mild congestion versus developing infiltrate. There is no pleural effusion pneumothorax. No acute osseous pathology. IMPRESSION: 1. Mild cardiomegaly. 2. Faint left perihilar density may  represent mild congestion versus developing infiltrate. Electronically Signed   By: Anner Crete M.D.   On: 02/19/2020 19:13    Procedures Procedures (including critical  care time)  Medications Ordered in ED Medications  magic mouthwash (has no administration in time range)  potassium chloride 10 mEq in 100 mL IVPB (has no administration in time range)  lactated ringers bolus 1,000 mL (0 mLs Intravenous Stopped 02/19/20 2018)  ceFEPIme (MAXIPIME) 2 g in sodium chloride 0.9 % 100 mL IVPB (2 g Intravenous New Bag/Given 02/19/20 2021)  doxycycline (VIBRA-TABS) tablet 100 mg (100 mg Oral Given 02/19/20 2019)    ED Course  I have reviewed the triage vital signs and the nursing notes.  Pertinent labs & imaging results that were available during my care of the patient were reviewed by me and considered in my medical decision making (see chart for details).    MDM Rules/Calculators/A&P                         patient with history of mantle cell lymphoma currently undergoing chemotherapy here for evaluation of fever to 100.9 for the last few days with associated nausea and shortness of breath. She was treated Paraclete was set for pain for possible neutropenic fever. Chest x-ray today with left lower lobe infiltrate, concern for developing infection and setting of recurrent symptoms. Will add doxycycline. Potassium low at 2.6, will provide IV repletion. Hospitalist consulted for admission for ongoing treatment. Patient updated of findings of studies and  recommendation for admission she is in agreement treatment plan  Final Clinical Impression(s) / ED Diagnoses Final diagnoses:  None    Rx / DC Orders ED Discharge Orders    None       Quintella Reichert, MD 02/19/20 2207

## 2020-02-19 NOTE — H&P (Signed)
History and Physical    Brooke [name] [id number] DOB: Mar 13, 1945 DOA: 02/19/2020  PCP: Tamsen Roers, MD   Patient coming from: Home  Chief Complaint: Fever, generalized weakness  HPI: Brooke [name] is a 75 y.o. 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 She has been feeling unwell for the last week and developed fever 3 days ago.  Had constipation initially and took a laxative followed by diarrhea with the last episode occurring yesterday. She does complain of nonspecific abdominal discomfort.  She states she has had fever the past three nights up to 101 F.  She has associated chills and night sweats. She has been using tylenol for fever which has control fever. She reports having a poor appetite and nausea the past 2-3 days. Her last chemotherapy treatment was a week ago on 02/13/20.  Lives with her daughter.  No known sick contacts.  No known exposures to COVID-19. She denies tobacco or alcohol use.  States her daughter does smoke but only smokes outside of the house.  ED Course: Emergency room chest x-ray reveals a left perihilar infiltrate consistent with early pneumonia.  She is found to be neutropenic.  COVID-19 test is negative.  Hospitalist service has been asked to admit for further management  Review of Systems:  General: Reports generalized weakness. Reports fever, chills. Denies weight loss.  Denies dizziness. Reports decreased appetite HENT: Denies head trauma, headache, denies change in hearing, tinnitus.  Denies nasal congestion or bleeding.  Denies sore throat, sores in mouth.  Denies difficulty swallowing Eyes: Denies blurry vision, pain in eye, drainage.  Denies discoloration of eyes. Neck: Denies pain.  Denies swelling.  Denies pain with movement. Cardiovascular: Denies chest pain, palpitations.  Denies edema.  Denies orthopnea Respiratory: Denies  shortness of breath, cough.  Denies wheezing.  Denies sputum production Gastrointestinal: Denies abdominal pain, swelling.  Denies nausea, vomiting, diarrhea.  Denies melena.  Denies hematemesis. Musculoskeletal: Denies limitation of movement.  Denies deformity or swelling.  Denies pain.  Denies arthralgias or myalgias. Genitourinary: Denies pelvic pain.  Denies urinary frequency or hesitancy.  Denies dysuria.  Skin: Denies rash.  Denies petechiae, purpura, ecchymosis. Neurological: Denies headache.  Denies syncope.  Denies seizure activity.  Denies weakness or paresthesia.  Denies slurred speech, drooping face.  Denies visual change. Psychiatric: Denies depression, anxiety.  Denies suicidal thoughts or ideation.  Denies hallucinations.  Past Medical History:  Diagnosis Date  . Allergy   . Arthritis   . Back pain   . Cancer ([place])   . Complication of anesthesia    bp dropped yrs ago, recent surgeries went ok  . Dysrhythmia    occ, no meds for  . GERD (gastroesophageal reflux disease)   . H. pylori infection   . Headache    migraines occ  . Hiatal hernia   . Hx of adenomatous colonic polyps 11/30/2017  . Hyperlipidemia   . Hypothyroidism 1970's   hx of years ago, no meds for   . Shortness of breath dyspnea    with exertion, pt told comes from acid reflux    Past Surgical History:  Procedure Laterality Date  . APPENDECTOMY    . COLONOSCOPY  12/07/2013  . DILATION AND CURETTAGE OF UTERUS    . LAPAROSCOPIC CHOLECYSTECTOMY SINGLE SITE WITH INTRAOPERATIVE CHOLANGIOGRAM N/A 10/28/2015   Procedure: LAPAROSCOPIC CHOLECYSTECTOMY WITH INTRAOPERATIVE CHOLANGIOGRAM;  Surgeon: Armandina Gemma, MD;  Location: [facility];  Service:  General;  Laterality: N/A;  . LAPAROSCOPY  yrs ago   x 2  . TMJ ARTHROSCOPY    . TOTAL ABDOMINAL HYSTERECTOMY     complete    Social History  reports that she has never smoked. She has never used smokeless tobacco. She reports current alcohol use of about 4.0 - 5.0  standard drinks of alcohol per week. She reports that she does not use drugs.  No Known Allergies  Family History  Problem Relation Age of Onset  . Colon cancer Father        early 44's  . Ovarian cancer Sister   . Kidney cancer Brother   . Colon polyps Brother   . Colon cancer Paternal Grandfather        unknown age of onset  . Lung cancer Brother   . Colon polyps Sister   . Breast cancer Maternal Aunt   . Breast cancer Cousin   . Rectal cancer Neg Hx   . Stomach cancer Neg Hx   . Esophageal cancer Neg Hx      Prior to Admission medications   Medication Sig Start Date End Date Taking? Authorizing Provider  acetaminophen (TYLENOL) 500 MG tablet Take 1,000 mg by mouth every 6 (six) hours as needed for moderate pain.   Yes [provider]  Cholecalciferol (VITAMIN D3) 5000 units CAPS Take 1 capsule by mouth daily.   Yes [provider]  cyclobenzaprine (FLEXERIL) 10 MG tablet Take 10 mg by mouth daily as needed for muscle spasms.   Yes [provider]  dexamethasone (DECADRON) 4 MG tablet Take 2 tablets (8 mg total) by mouth daily. Start 3 days before and continue 2 days after bendamustine/Rituxan chemotherapy for 2 days. Take with food. 11/21/19  Yes Brunetta Genera, MD  diclofenac Sodium (VOLTAREN) 1 % GEL Apply 1 application topically 4 (four) times daily. 07/23/19  Yes [provider]  dronabinol (MARINOL) 2.5 MG capsule Take 1 capsule (2.5 mg total) by mouth 2 (two) times daily before a meal. Patient taking differently: Take 2.5 mg by mouth 2 (two) times daily as needed (nausea). 01/07/20  Yes Brunetta Genera, MD  fluticasone [facility]) 50 MCG/ACT nasal spray Place 1 spray into both nostrils daily as needed for allergies. 09/17/19  Yes [provider]  HYDROcodone-acetaminophen (NORCO/VICODIN) 5-325 MG tablet Take 1-2 tablets by mouth every 4 (four) hours as needed for moderate pain. 10/28/15  Yes Armandina Gemma, MD  LORazepam  (ATIVAN) 0.5 MG tablet Take 1 tablet (0.5 mg total) by mouth every 8 (eight) hours as needed for anxiety (Nausea or vomiting). 01/07/20  Yes Brunetta Genera, MD  omeprazole (PRILOSEC) 20 MG capsule Take 20 mg by mouth every morning. 09/25/19  Yes [provider]  ondansetron (ZOFRAN) 8 MG tablet Take 1 tablet (8 mg total) by mouth 2 (two) times daily as needed for refractory nausea / vomiting. Start on day 2 after bendamustine chemo. 10/08/19  Yes Brunetta Genera, MD  potassium chloride SA (KLOR-CON) 20 MEQ tablet Take 1 tablet (20 mEq total) by mouth daily. 12/31/19  Yes Tanner, Lyndon Code., PA-C  rosuvastatin (CRESTOR) 5 MG tablet Take 5 mg by mouth daily.   Yes [provider]  acyclovir (ZOVIRAX) 400 MG tablet TAKE 1 TABLET BY MOUTH TWICE A DAY Patient not taking: No sig reported 12/27/19   Brunetta Genera, MD  amoxicillin-clavulanate (AUGMENTIN) 875-125 MG tablet Take 1 tablet by mouth 2 (two) times daily. Patient not taking: No  sig reported 12/28/19   Harle Stanford., PA-C  nystatin (MYCOSTATIN) 100000 UNIT/ML suspension TAKE 5MLS BY MOUTH FOUR TIMES DAILY Patient taking differently: Take 5 mLs by mouth 3 (three) times daily as needed (infection). 02/18/20   Brunetta Genera, MD  pantoprazole (PROTONIX) 40 MG tablet Take 1 tablet (40 mg total) by mouth every morning. Before breakfast Patient not taking: Reported on 02/19/2020 08/19/15   Pyrtle, Lajuan Lines, MD  predniSONE (DELTASONE) 20 MG tablet Take 1 tablet (20 mg total) by mouth daily with breakfast. Patient not taking: No sig reported 11/15/19   Brunetta Genera, MD  prochlorperazine (COMPAZINE) 10 MG tablet Take 1 tablet (10 mg total) by mouth every 6 (six) hours as needed (Nausea or vomiting). Patient not taking: No sig reported 10/08/19   Brunetta Genera, MD  promethazine (PHENERGAN) 25 MG tablet Take 1 tablet (25 mg total) by mouth every 6 (six) hours as needed for nausea or vomiting. Patient not taking: No  sig reported 10/29/15   Armandina Gemma, MD    Physical Exam: Vitals:   02/19/20 1701 02/19/20 1802 02/19/20 1830 02/19/20 2017  BP: 121/75 117/75 116/72 (!) 135/92  Pulse: (!) 102 88 80 88  Resp: 18 19 19 18   Temp: 98.1 F (36.7 C)     TempSrc: Oral     SpO2: 100% 97% 98% 99%    Constitutional: NAD, calm, comfortable Vitals:   02/19/20 1701 02/19/20 1802 02/19/20 1830 02/19/20 2017  BP: 121/75 117/75 116/72 (!) 135/92  Pulse: (!) 102 88 80 88  Resp: 18 19 19 18   Temp: 98.1 F (36.7 C)     TempSrc: Oral     SpO2: 100% 97% 98% 99%   General: WDWN, Alert and oriented x3.  Eyes: EOMI, PERRL, conjunctivae normal.  Sclera nonicteric HENT:  [place]/AT, external ears normal.  Nares patent without epistasis.  Mucous membranes are moist. Posterior pharynx clear of any exudate or lesions. Tongue midline and dry with white discoloration Neck: Soft, normal range of motion, supple, no masses, no thyromegaly. Trachea midline Respiratory: clear to auscultation bilaterally, no wheezing, no crackles. Normal respiratory effort. No accessory muscle use.  Cardiovascular: Regular rate and rhythm, no murmurs / rubs / gallops. No extremity edema.  Abdomen: Soft, no tenderness, nondistended, no rebound or guarding. No masses palpated. No hepatosplenomegaly. Bowel sounds normoactive Musculoskeletal: FROM. no cyanosis. Normal muscle tone.  Skin: Warm, dry, intact no rashes, lesions, ulcers. No induration Neurologic: CN 2-12 grossly intact. Normal speech.  Sensation intact, Strength 5/5 in all extremities.   Psychiatric: Normal judgment and insight.  Normal mood.    Labs on Admission: I have personally reviewed following labs and imaging studies  CBC: Recent Labs  Lab 02/19/20 1728  WBC 2.7*  NEUTROABS 1.2*  HGB 9.9*  HCT 29.6*  MCV 96.4  PLT 123456    Basic Metabolic Panel: Recent Labs  Lab 02/19/20 1728  NA 136  K 2.6*  CL 98  CO2 25  GLUCOSE 90  BUN 10  CREATININE 0.87  CALCIUM 8.6*     GFR: Estimated Creatinine Clearance: 47.6 mL/min (by C-G formula based on SCr of 0.87 mg/dL).  Liver Function Tests: Recent Labs  Lab 02/19/20 1728  AST 15  ALT 10  ALKPHOS 57  BILITOT 0.9  PROT 6.0*  ALBUMIN 3.3*    Urine analysis:    Component Value Date/Time   COLORURINE STRAW (A) 02/19/2020 1728   APPEARANCEUR CLEAR 02/19/2020 1728   LABSPEC 1.003 (L)  02/19/2020 1728   PHURINE 7.0 02/19/2020 1728   GLUCOSEU NEGATIVE 02/19/2020 1728   HGBUR NEGATIVE 02/19/2020 1728   BILIRUBINUR NEGATIVE 02/19/2020 1728   KETONESUR NEGATIVE 02/19/2020 1728   PROTEINUR NEGATIVE 02/19/2020 1728   NITRITE NEGATIVE 02/19/2020 1728   LEUKOCYTESUR NEGATIVE 02/19/2020 1728    Radiological Exams on Admission: DG Chest Port 1 View  Result Date: 02/19/2020 CLINICAL DATA:  75 year old female with sepsis. EXAM: PORTABLE CHEST 1 VIEW COMPARISON:  Chest radiograph dated 12/27/2019. FINDINGS: Mild cardiomegaly. Faint left perihilar density may represent mild congestion versus developing infiltrate. There is no pleural effusion pneumothorax. No acute osseous pathology. IMPRESSION: 1. Mild cardiomegaly. 2. Faint left perihilar density may represent mild congestion versus developing infiltrate. Electronically Signed   By: Anner Crete M.D.   On: 02/19/2020 19:13    EKG: Independently reviewed.  EKG shows normal sinus rhythm with no acute ST elevation or depression.  QTc 476  Assessment/Plan Principal Problem:   CAP (community acquired pneumonia) Ms. Cartelli is admitted to telemetry unit.  Started on Cefepime and doxycycline for antibiotic coverage for community-acquired pneumonia in setting of immunosuppression with chemotherapy and neutropenia.  Antibiotic coverage discussed with pharmacist who agrees with regimen. Incentive spirometer every 1-2 hours while awake.  Encouraged to frequently deep cough to keep lungs expanded and clear sputum. Is not requiring oxygen at this time but if short  status deteriorates or becomes hypoxic will provide supplemental oxygen as needed Check CBC, BMP in morning  Active Problems:   Drug-induced neutropenia  Patient placed on neutropenic precautions    Hypokalemia Potassium being repleted with dose in the emergency room.  We will order further potassium repletion. Check magnesium level and if it is low we will replace before further potassium is provided. 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  Last chemotherapy session was on February 13, 2020    DVT prophylaxis: High [place] score.  Lovenox for DVT prophylaxis Code Status:   Full code Family Communication:  Diagnosis and plan discussed with patient.  Questions answered.  She verbalizes understanding and agrees with plan.  Further recommendations to follow as clinically indicated Disposition Plan:   Patient is from:  Home  Anticipated DC to:  Home  Anticipated DC date:  Anticipate greater than 2 midnight stay in the hospital to treat acute condition  Anticipated DC barriers: No barriers to discharge identified at this time  Admission status:  Inpatient  Yevonne Aline Lissa Rowles MD [facility]  How to contact the [facility] Attending or Consulting provider [place] or covering provider during after hours [place], for this patient?   1. Check the care team in [facility] and look for a) attending/consulting [facility] provider listed and b) the [facility] team listed 2. Log into [email or web address] and use [place]'s universal password to access. If you do not have the password, please contact the hospital operator. 3. Locate the [facility] provider you are looking for under [facility] and page to a number that you can be directly reached. 4. If you still have difficulty reaching the provider, please page the [facility] (Director on Call) for the Hospitalists listed on amion for assistance.  02/19/2020, 9:08 PM

## 2020-02-19 NOTE — ED Notes (Signed)
Informed pt we need a urine sample. 

## 2020-02-19 NOTE — ED Triage Notes (Signed)
Pt complains of fever at night, diarrhea, nausea, since chemo treatment last week. Pt has hx of leukemia.

## 2020-02-19 NOTE — ED Notes (Signed)
Initial contact with pt. Provider is speaking with pt at bedside. Fluids completed . On monitor  X4. Alert and oriented x4. Pt aware she will need to be admitted. Pt calm and has no complaints.

## 2020-02-20 ENCOUNTER — Other Ambulatory Visit: Payer: Self-pay

## 2020-02-20 LAB — CBC
HCT: 26.9 % — ABNORMAL LOW (ref 36.0–46.0)
Hemoglobin: 9.1 g/dL — ABNORMAL LOW (ref 12.0–15.0)
MCH: 32.4 pg (ref 26.0–34.0)
MCHC: 33.8 g/dL (ref 30.0–36.0)
MCV: 95.7 fL (ref 80.0–100.0)
Platelets: 153 10*3/uL (ref 150–400)
RBC: 2.81 MIL/uL — ABNORMAL LOW (ref 3.87–5.11)
RDW: 15.5 % (ref 11.5–15.5)
WBC: 5.7 10*3/uL (ref 4.0–10.5)
nRBC: 0 % (ref 0.0–0.2)

## 2020-02-20 LAB — BASIC METABOLIC PANEL
Anion gap: 11 (ref 5–15)
BUN: 9 mg/dL (ref 8–23)
CO2: 24 mmol/L (ref 22–32)
Calcium: 8.1 mg/dL — ABNORMAL LOW (ref 8.9–10.3)
Chloride: 103 mmol/L (ref 98–111)
Creatinine, Ser: 0.54 mg/dL (ref 0.44–1.00)
GFR, Estimated: >60 mL/min (ref >60)
Glucose, Bld: 77 mg/dL (ref 70–99)
Potassium: 3.4 mmol/L — ABNORMAL LOW (ref 3.5–5.1)
Sodium: 138 mmol/L (ref 135–145)

## 2020-02-20 LAB — MAGNESIUM: Magnesium: 1.5 mg/dL — ABNORMAL LOW (ref 1.7–2.4)

## 2020-02-20 MED ORDER — POTASSIUM CHLORIDE CRYS ER 20 MEQ PO TBCR
40.0000 meq | EXTENDED_RELEASE_TABLET | Freq: Once | ORAL | Status: AC
  Administered 2020-02-20: 40 meq via ORAL
  Filled 2020-02-20: qty 2

## 2020-02-20 MED ORDER — SODIUM CHLORIDE 0.9 % IV SOLN
2.0000 g | Freq: Two times a day (BID) | INTRAVENOUS | Status: DC
  Administered 2020-02-20 – 2020-02-23 (×7): 2 g via INTRAVENOUS
  Filled 2020-02-20: qty 2
  Filled 2020-02-20: qty 0.28
  Filled 2020-02-20 (×4): qty 2
  Filled 2020-02-20: qty 0.28
  Filled 2020-02-20: qty 2

## 2020-02-20 MED ORDER — LOPERAMIDE HCL 2 MG PO CAPS
2.0000 mg | ORAL_CAPSULE | ORAL | Status: DC | PRN
  Administered 2020-02-20 – 2020-02-21 (×4): 2 mg via ORAL
  Filled 2020-02-20 (×4): qty 1

## 2020-02-20 MED ORDER — ROSUVASTATIN CALCIUM 5 MG PO TABS
5.0000 mg | ORAL_TABLET | Freq: Every day | ORAL | Status: DC
  Administered 2020-02-20 – 2020-02-23 (×4): 5 mg via ORAL
  Filled 2020-02-20 (×4): qty 1

## 2020-02-20 MED ORDER — ONDANSETRON HCL 4 MG/2ML IJ SOLN
4.0000 mg | Freq: Three times a day (TID) | INTRAMUSCULAR | Status: DC | PRN
  Administered 2020-02-20 – 2020-02-21 (×3): 4 mg via INTRAVENOUS
  Filled 2020-02-20 (×4): qty 2

## 2020-02-20 MED ORDER — LORAZEPAM 0.5 MG PO TABS: 0.5000 mg | ORAL_TABLET | Freq: Three times a day (TID) | ORAL | Status: DC | PRN

## 2020-02-20 MED ORDER — NYSTATIN 100000 UNIT/ML MT SUSP
5.0000 mL | Freq: Three times a day (TID) | OROMUCOSAL | Status: DC
  Administered 2020-02-20 – 2020-02-23 (×9): 500000 [IU] via ORAL
  Filled 2020-02-20 (×9): qty 5

## 2020-02-20 MED ORDER — ENOXAPARIN SODIUM 40 MG/0.4ML ~~LOC~~ SOLN
40.0000 mg | SUBCUTANEOUS | Status: DC
  Administered 2020-02-20 – 2020-02-22 (×3): 40 mg via SUBCUTANEOUS
  Filled 2020-02-20 (×3): qty 0.4

## 2020-02-20 MED ORDER — ENOXAPARIN SODIUM 40 MG/0.4ML ~~LOC~~ SOLN: 40.0000 mg | SUBCUTANEOUS | Status: DC

## 2020-02-20 MED ORDER — SODIUM CHLORIDE 0.9 % IV SOLN
100.0000 mg | Freq: Two times a day (BID) | INTRAVENOUS | Status: DC
  Administered 2020-02-20 – 2020-02-23 (×7): 100 mg via INTRAVENOUS
  Filled 2020-02-20 (×8): qty 100

## 2020-02-20 MED ORDER — ACETAMINOPHEN 325 MG PO TABS
650.0000 mg | ORAL_TABLET | Freq: Four times a day (QID) | ORAL | Status: DC | PRN
  Administered 2020-02-20 – 2020-02-21 (×3): 650 mg via ORAL
  Filled 2020-02-20 (×5): qty 2

## 2020-02-20 MED ORDER — POTASSIUM CHLORIDE 10 MEQ/100ML IV SOLN
10.0000 meq | INTRAVENOUS | Status: AC
  Administered 2020-02-20 (×4): 10 meq via INTRAVENOUS
  Filled 2020-02-20 (×4): qty 100

## 2020-02-20 MED ORDER — LACTATED RINGERS IV SOLN: INTRAVENOUS | Status: DC

## 2020-02-20 MED ORDER — MAGNESIUM SULFATE 2 GM/50ML IV SOLN
2.0000 g | Freq: Once | INTRAVENOUS | Status: AC
  Administered 2020-02-20: 2 g via INTRAVENOUS
  Filled 2020-02-20: qty 50

## 2020-02-20 NOTE — ED Notes (Signed)
Pt labs have been sent for processing and pt has been medicated per MD orders. Pt on monitor 4, will continue to monitor.

## 2020-02-20 NOTE — ED Notes (Signed)
Pt ambulatory to restroom with steady gait. Awaiting room assignment no change in status.

## 2020-02-20 NOTE — ED Notes (Signed)
Pt has been medicated per MD orders, pt tolerated well. Pt status remains the same. Will continue to monitor.

## 2020-02-20 NOTE — TOC Initial Note (Signed)
Transition of Care [facility]) - Initial/Assessment Note    Patient Details  Name: Brooke [name] MRN: [id number] Date of Birth: 22-Nov-1945  Transition of Care [facility] CM/SW Contact:    Dessa Phi, RN Phone Number: 02/20/2020, 2:56 PM  Clinical Narrative: d/c plan home.                  Expected Discharge Plan: Home/Self Care Barriers to Discharge: Continued Medical Work up   Patient Goals and CMS Choice Patient states their goals for this hospitalization and ongoing recovery are:: go home CMS Medicare.gov Compare Post Acute Care list provided to:: Patient    Expected Discharge Plan and Services Expected Discharge Plan: Home/Self Care   Discharge Planning Services: CM Consult   Living arrangements for the past 2 months: Single Family Home                                      Prior Living Arrangements/Services Living arrangements for the past 2 months: Single Family Home Lives with:: Self Patient language and need for interpreter reviewed:: Yes Do you feel safe going back to the place where you live?: Yes      Need for Family Participation in Patient Care: No (Comment) Care giver support system in place?: Yes (comment)   Criminal Activity/Legal Involvement Pertinent to Current Situation/Hospitalization: No - Comment as needed  Activities of Daily Living Home Assistive Devices/Equipment: Eyeglasses ADL Screening (condition at time of admission) Patient's cognitive ability adequate to safely complete daily activities?: Yes Is the patient deaf or have difficulty hearing?: No Does the patient have difficulty seeing, even when wearing glasses/contacts?: No Does the patient have difficulty concentrating, remembering, or making decisions?: No Patient able to express need for assistance with ADLs?: Yes Does the patient have difficulty dressing or bathing?: No Independently performs ADLs?: Yes (appropriate for developmental age) Does the patient have  difficulty walking or climbing stairs?: No Weakness of Legs: None Weakness of Arms/Hands: None  Permission Sought/Granted Permission sought to share information with : Case Manager Permission granted to share information with : Yes, Verbal Permission Granted  Share Information with NAME: Case manger     Permission granted to share info w Relationship: Dorothe sister [phone]     Emotional Assessment Appearance:: Appears stated age Attitude/Demeanor/Rapport: Gracious Affect (typically observed): Accepting Orientation: : Oriented to Self,Oriented to Place,Oriented to  Time,Oriented to Situation Alcohol / Substance Use: Not Applicable Psych Involvement: No (comment)  Admission diagnosis:  CAP (community acquired pneumonia) [J18.9] Patient Active Problem List   Diagnosis Date Noted  . Hypokalemia 02/19/2020  . CAP (community acquired pneumonia) 02/19/2020  . Neutropenia with fever ([place]) 02/19/2020  . Drug-induced neutropenia ([place]) 12/28/2019  . 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 Counseling regarding advance care planning and goals of care 10/08/2019  . Hx of adenomatous colonic polyps 11/14/2017  . Cholelithiasis with chronic cholecystitis 10/27/2015  . GERD 05/28/2008  . ESOPHAGEAL STRICTURE 05/27/2008   PCP:  Tamsen Roers, MD Pharmacy:   [facility] [address], [place] [address] Phone: [phone] Fax: [place], [place] [address] Phone: [phone] Fax: [phone]     Social Determinants of Health (SDOH) Interventions    Readmission Risk Interventions Readmission Risk  Prevention Plan 02/20/2020  Transportation Screening Complete  PCP or Specialist Appt within 5-7 Days Complete  Home Care Screening Complete  Medication Review (RN CM) Complete  Some recent data  might be hidden

## 2020-02-20 NOTE — Progress Notes (Signed)
PROGRESS NOTE    Brooke [name]  [id number] DOB: [birth year] DOA: 02/19/2020 PCP: Tamsen Roers, MD     Brief Narrative:  Brooke [name] is a 75 y.o. 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 She has been feeling unwell for the last week and developed fever 3 days ago. Had constipation initially and took a laxative followed by diarrhea with the last episode occurring yesterday. She does complain of nonspecific abdominal discomfort. She states she has had fever the past three nights up to 101 F. She has associated chills and night sweats. She has been using tylenol for fever which has controlled fever. She reports having a poor appetite and nausea the past 2-3 days. Her last chemotherapy treatment was a week ago on 02/13/20.  In the emergency department, chest x-ray revealed a left perihilar infiltrate consistent with early pneumonia.  COVID negative.  Patient was started on IV antibiotics and admitted for further treatment and care.  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 Irene Limbo -Last chemotherapy 02/13/2020  HLD -Continue crestor      DVT prophylaxis: Lovenox    Code Status: Full code Family  Communication: No family at bedside Disposition Plan:  Status is: Inpatient  Remains inpatient appropriate because:IV treatments appropriate due to intensity of illness or inability to take PO   Dispo: The patient is from: Home              Anticipated d/c is to: Home              Anticipated d/c date is: 1 day              Patient currently is not medically stable to d/c.  Continue IV antibiotics, replace potassium and magnesium.     Antimicrobials:  Anti-infectives (From admission, onward)   Start     Dose/Rate Route Frequency Ordered Stop   02/20/20 0800  ceFEPIme (MAXIPIME) 2 g in sodium chloride 0.9 % 100 mL IVPB        2 g 200 mL/hr over 30 Minutes Intravenous Every 12 hours 02/20/20 0338     02/20/20 0800  doxycycline (VIBRAMYCIN) 100 mg in sodium chloride 0.9 % 250 mL IVPB        100 mg 125 mL/hr over 120 Minutes Intravenous Every 12 hours 02/20/20 0338     02/19/20 2000  doxycycline (VIBRA-TABS) tablet 100 mg        100 mg Oral  Once 02/19/20 1953 02/19/20 2019   02/19/20 1930  ceFEPIme (MAXIPIME) 2 g in sodium chloride 0.9 % 100 mL IVPB        2 g 200 mL/hr over 30 Minutes Intravenous  Once 02/19/20 1923 02/19/20 2213        Objective: Vitals:   02/20/20 0350 02/20/20 0608 02/20/20 0614 02/20/20 0952  BP:  110/67 110/67 125/78  Pulse:  92 94 89  Resp:  16  20  Temp:  99.1 F (37.3 C) 99.1 F (37.3 C) 98.9 F (37.2 C)  TempSrc:  Oral Oral Oral  SpO2:   97% 95%  Weight: 50.9 kg 50.9 kg    Height:  5\' 3"  (1.6 m)      Intake/Output Summary (Last 24 hours) at 02/20/2020 1151 Last data filed at 02/20/2020 0600 Gross per 24 hour  Intake 1425 ml  Output --  Net 1425 ml   Filed Weights   02/20/20 0350 02/20/20 0608  Weight: 50.9 kg 50.9 kg    Examination:  General exam: Appears calm and comfortable  Respiratory system: Left lower base crackles. Respiratory effort normal. No respiratory distress. No conversational dyspnea.  Cardiovascular system: S1 & S2  heard, RRR. No murmurs. No pedal edema. Gastrointestinal system: Abdomen is nondistended, soft and nontender. Normal bowel sounds heard. Central nervous system: Alert and oriented. No focal neurological deficits. Speech clear.  Extremities: Symmetric in appearance  Skin: No rashes, lesions or ulcers on exposed skin  Psychiatry: Judgement and insight appear normal. Mood & affect appropriate.   Data Reviewed: I have personally reviewed following labs and imaging studies  CBC: Recent Labs  Lab 02/19/20 1728 02/20/20 0949  WBC 2.7* 5.7  NEUTROABS 1.2*  --   HGB 9.9* 9.1*  HCT 29.6* 26.9*  MCV 96.4 95.7  PLT 164 976   Basic Metabolic Panel: Recent Labs  Lab 02/19/20 1728 02/19/20 2352 02/20/20 0949  NA 136  --  138  K 2.6*  --  3.4*  CL 98  --  103  CO2 25  --  24  GLUCOSE 90  --  77  BUN 10  --  9  CREATININE 0.87  --  0.54  CALCIUM 8.6*  --  8.1*  MG  --  1.5*  --    GFR: Estimated Creatinine Clearance: 50.3 mL/min (by C-G formula based on SCr of 0.54 mg/dL). Liver Function Tests: Recent Labs  Lab 02/19/20 1728  AST 15  ALT 10  ALKPHOS 57  BILITOT 0.9  PROT 6.0*  ALBUMIN 3.3*   No results for input(s): LIPASE, AMYLASE in the last 168 hours. No results for input(s): AMMONIA in the last 168 hours. Coagulation Profile: Recent Labs  Lab 02/19/20 1728  INR 0.9   Cardiac Enzymes: No results for input(s): CKTOTAL, CKMB, CKMBINDEX, TROPONINI in the last 168 hours. BNP (last 3 results) No results for input(s): PROBNP in the last 8760 hours. HbA1C: No results for input(s): HGBA1C in the last 72 hours. CBG: No results for input(s): GLUCAP in the last 168 hours. Lipid Profile: No results for input(s): CHOL, HDL, LDLCALC, TRIG, CHOLHDL, LDLDIRECT in the last 72 hours. Thyroid Function Tests: No results for input(s): TSH, T4TOTAL, FREET4, T3FREE, THYROIDAB in the last 72 hours. Anemia Panel: No results for input(s): VITAMINB12, FOLATE, FERRITIN, TIBC, IRON,  RETICCTPCT in the last 72 hours. Sepsis Labs: Recent Labs  Lab 02/19/20 1728  LATICACIDVEN 1.5    Recent Results (from the past 240 hour(s))  Resp Panel by RT-PCR (Flu A&B, Covid) Nasopharyngeal Swab     Status: None   Collection Time: 02/19/20  5:28 PM   Specimen: Nasopharyngeal Swab; Nasopharyngeal(NP) swabs in vial transport medium  Result Value Ref Range Status   SARS Coronavirus 2 by RT PCR NEGATIVE NEGATIVE Final    Comment: (NOTE) SARS-CoV-2 target nucleic acids are NOT DETECTED.  The SARS-CoV-2 RNA is generally detectable in upper  respiratory specimens during the acute phase of infection. The lowest concentration of SARS-CoV-2 viral copies this assay can detect is 138 copies/mL. A negative result does not preclude SARS-Cov-2 infection and should not be used as the sole basis for treatment or other patient management decisions. A negative result may occur with  improper specimen collection/handling, submission of specimen other than nasopharyngeal swab, presence of viral mutation(s) within the areas targeted by this assay, and inadequate number of viral copies(<138 copies/mL). A negative result must be combined with clinical observations, patient history, and epidemiological information. The expected result is Negative.  Fact Sheet for Patients:  EntrepreneurPulse.com.au  Fact Sheet for Healthcare Providers:  IncredibleEmployment.be  This test is no t yet approved or cleared by the [place] FDA and  has been authorized for detection and/or diagnosis of SARS-CoV-2 by FDA under an Emergency Use Authorization (EUA). This EUA will remain  in effect (meaning this test can be used) for the duration of the COVID-19 declaration under Section 564(b)(1) of the Act, 21 U.S.C.section 360bbb-3(b)(1), unless the authorization is terminated  or revoked sooner.       Influenza A by PCR NEGATIVE NEGATIVE Final   Influenza B by PCR NEGATIVE  NEGATIVE Final    Comment: (NOTE) The Xpert Xpress SARS-CoV-2/FLU/RSV plus assay is intended as an aid in the diagnosis of influenza from Nasopharyngeal swab specimens and should not be used as a sole basis for treatment. Nasal washings and aspirates are unacceptable for Xpert Xpress SARS-CoV-2/FLU/RSV testing.  Fact Sheet for Patients: EntrepreneurPulse.com.au  Fact Sheet for Healthcare Providers: IncredibleEmployment.be  This test is not yet approved or cleared by the [place] FDA and has been authorized for detection and/or diagnosis of SARS-CoV-2 by FDA under an Emergency Use Authorization (EUA). This EUA will remain in effect (meaning this test can be used) for the duration of the COVID-19 declaration under Section 564(b)(1) of the Act, 21 U.S.C. section 360bbb-3(b)(1), unless the authorization is terminated or revoked.  Performed at [facility], [place] [address], [place], [place] [address]   Blood Culture (routine x 2)     Status: None (Preliminary result)   Collection Time: 02/19/20  6:05 PM   Specimen: BLOOD  Result Value Ref Range Status   Specimen Description   Final    BLOOD RIGHT ANTECUBITAL Performed at [facility] [address] [place], [place] [address]    Special Requests   Final    BOTTLES DRAWN AEROBIC AND ANAEROBIC Blood Culture adequate volume Performed at [facility] [address], [place], [place] [address]    Culture   Final    NO GROWTH < 12 HOURS Performed at [facility] [address] [place], [place] [address]    Report Status PENDING  Incomplete  Blood Culture (routine x 2)     Status: None (Preliminary result)   Collection Time: 02/19/20  6:06 PM   Specimen: BLOOD  Result Value Ref Range Status   Specimen Description   Final    BLOOD LEFT ANTECUBITAL Performed at [facility] [address] [place],  [place] [address]    Special Requests   Final    BOTTLES DRAWN AEROBIC AND ANAEROBIC Blood Culture adequate volume Performed at [facility] [address], [place], [place] [address]    Culture   Final    NO GROWTH < 12 HOURS Performed at [facility] [address], [place], [place] [address]  Report Status PENDING  Incomplete      Radiology Studies: DG Chest Port 1 View  Result Date: 02/19/2020 CLINICAL DATA:  74 year old female with sepsis. EXAM: PORTABLE CHEST 1 VIEW COMPARISON:  Chest radiograph dated 12/27/2019. FINDINGS: Mild cardiomegaly. Faint left perihilar density may represent mild congestion versus developing infiltrate. There is no pleural effusion pneumothorax. No acute osseous pathology. IMPRESSION: 1. Mild cardiomegaly. 2. Faint left perihilar density may represent mild congestion versus developing infiltrate. Electronically Signed   By: Anner Crete M.D.   On: 02/19/2020 19:13      Scheduled Meds: . rosuvastatin  5 mg Oral Daily   Continuous Infusions: . ceFEPime (MAXIPIME) IV 2 g (02/20/20 0932)  . doxycycline (VIBRAMYCIN) IV 100 mg (02/20/20 1039)  . lactated ringers 75 mL/hr at 02/20/20 0416  . magnesium sulfate bolus IVPB       LOS: 1 day      Time spent: 25 minutes   Dessa Phi, DO [facility] 02/20/2020, 11:51 AM   Available via Epic secure chat 7am-7pm After these hours, please refer to coverage provider listed on [email or web address]

## 2020-02-21 LAB — BASIC METABOLIC PANEL
Anion gap: 9 (ref 5–15)
BUN: 5 mg/dL — ABNORMAL LOW (ref 8–23)
CO2: 23 mmol/L (ref 22–32)
Calcium: 8.1 mg/dL — ABNORMAL LOW (ref 8.9–10.3)
Chloride: 104 mmol/L (ref 98–111)
Creatinine, Ser: 0.72 mg/dL (ref 0.44–1.00)
GFR, Estimated: >60 mL/min (ref >60)
Glucose, Bld: 92 mg/dL (ref 70–99)
Potassium: 3.4 mmol/L — ABNORMAL LOW (ref 3.5–5.1)
Sodium: 136 mmol/L (ref 135–145)

## 2020-02-21 LAB — URINE CULTURE

## 2020-02-21 LAB — CBC
HCT: 27.9 % — ABNORMAL LOW (ref 36.0–46.0)
Hemoglobin: 9.3 g/dL — ABNORMAL LOW (ref 12.0–15.0)
MCH: 32.3 pg (ref 26.0–34.0)
MCHC: 33.3 g/dL (ref 30.0–36.0)
MCV: 96.9 fL (ref 80.0–100.0)
Platelets: 145 10*3/uL — ABNORMAL LOW (ref 150–400)
RBC: 2.88 MIL/uL — ABNORMAL LOW (ref 3.87–5.11)
RDW: 15.9 % — ABNORMAL HIGH (ref 11.5–15.5)
WBC: 14.3 10*3/uL — ABNORMAL HIGH (ref 4.0–10.5)
nRBC: 0 % (ref 0.0–0.2)

## 2020-02-21 LAB — MAGNESIUM: Magnesium: 1.8 mg/dL (ref 1.7–2.4)

## 2020-02-21 MED ORDER — POTASSIUM CHLORIDE CRYS ER 20 MEQ PO TBCR
40.0000 meq | EXTENDED_RELEASE_TABLET | Freq: Once | ORAL | Status: AC
  Administered 2020-02-21: 40 meq via ORAL
  Filled 2020-02-21: qty 2

## 2020-02-21 NOTE — Progress Notes (Signed)
PROGRESS NOTE    Brooke [name]  [id number] DOB: 1945/05/06 DOA: 02/19/2020 PCP: Tamsen Roers, MD     Brief Narrative:  Brooke [name] is a 75 y.o. 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 She has been feeling unwell for the last week and developed fever 3 days ago. Had constipation initially and took a laxative followed by diarrhea with the last episode occurring yesterday. She does complain of nonspecific abdominal discomfort. She states she has had fever the past three nights up to 101 F. She has associated chills and night sweats. She has been using tylenol for fever which has controlled fever. She reports having a poor appetite and nausea the past 2-3 days. Her last chemotherapy treatment was a week ago on 02/13/20.  In the emergency department, chest x-ray revealed a left perihilar infiltrate consistent with early pneumonia.  COVID negative.  Patient was started on IV antibiotics and admitted for further treatment and care.  New events last 24 hours / Subjective: Doing well, but had a fever of 100.7 last night.  States that her symptoms come and go.  Diarrhea improving, denies any abdominal pain.  States that her stomach makes loud noises but is not cramping.  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 Irene Limbo -Last chemotherapy 02/13/2020  HLD -Continue crestor       DVT prophylaxis: Lovenox  enoxaparin (LOVENOX) injection 40 mg Start: 02/20/20 2200  Code Status: Full code Family Communication: No family at bedside Disposition Plan:  Status is: Inpatient  Remains inpatient appropriate because:IV treatments appropriate due to intensity of illness or inability to take PO   Dispo: The patient is from: Home              Anticipated d/c is to: Home              Anticipated d/c date is: 1 day              Patient currently is not medically stable to d/c.  Continue IV antibiotics, replace potassium      Antimicrobials:  Anti-infectives (From admission, onward)   Start     Dose/Rate Route Frequency Ordered Stop   02/20/20 0800  ceFEPIme (MAXIPIME) 2 g in sodium chloride 0.9 % 100 mL IVPB        2 g 200 mL/hr over 30 Minutes Intravenous Every 12 hours 02/20/20 0338     02/20/20 0800  doxycycline (VIBRAMYCIN) 100 mg in sodium chloride 0.9 % 250 mL IVPB        100 mg 125 mL/hr over 120 Minutes Intravenous Every 12 hours 02/20/20 0338     02/19/20 2000  doxycycline (VIBRA-TABS) tablet 100 mg        100 mg Oral  Once 02/19/20 1953 02/19/20 2019   02/19/20 1930  ceFEPIme (MAXIPIME) 2 g in sodium chloride 0.9 % 100 mL IVPB        2 g 200 mL/hr over 30 Minutes Intravenous  Once 02/19/20 1923 02/19/20 2213  Objective: Vitals:   02/21/20 0420 02/21/20 0541 02/21/20 0756 02/21/20 1146  BP: 115/68  115/71 104/76  Pulse: 96  89 89  Resp: 16  18 (!) 22  Temp: 99.2 F (37.3 C) (!) 100.4 F (38 C) 99.2 F (37.3 C) 98.7 F (37.1 C)  TempSrc: Oral  Oral Oral  SpO2: 96%  98% 98%  Weight:      Height:        Intake/Output Summary (Last 24 hours) at 02/21/2020 1223 Last data filed at 02/21/2020 0500 Gross per 24 hour  Intake 1538.75 ml  Output --  Net 1538.75 ml   Filed Weights   02/20/20 0350 02/20/20 0608  Weight: 50.9 kg 50.9 kg    Examination: General exam: Appears calm and comfortable  Respiratory system: Clear to  auscultation. Respiratory effort normal.  On room air Cardiovascular system: S1 & S2 heard, RRR. No pedal edema. Gastrointestinal system: Abdomen is nondistended, soft and nontender. Normal bowel sounds heard. Central nervous system: Alert and oriented. Non focal exam. Speech clear  Extremities: Symmetric in appearance bilaterally  Skin: No rashes, lesions or ulcers on exposed skin  Psychiatry: Judgement and insight appear stable. Mood & affect appropriate.   Data Reviewed: I have personally reviewed following labs and imaging studies  CBC: Recent Labs  Lab 02/19/20 1728 02/20/20 0949 02/21/20 0559  WBC 2.7* 5.7 14.3*  NEUTROABS 1.2*  --   --   HGB 9.9* 9.1* 9.3*  HCT 29.6* 26.9* 27.9*  MCV 96.4 95.7 96.9  PLT 164 153 [id number]*   Basic Metabolic Panel: Recent Labs  Lab 02/19/20 1728 02/19/20 2352 02/20/20 0949 02/21/20 0559  NA 136  --  138 136  K 2.6*  --  3.4* 3.4*  CL 98  --  103 104  CO2 25  --  24 23  GLUCOSE 90  --  77 92  BUN 10  --  9 5*  CREATININE 0.87  --  0.54 0.72  CALCIUM 8.6*  --  8.1* 8.1*  MG  --  1.5*  --  1.8   GFR: Estimated Creatinine Clearance: 50.3 mL/min (by C-G formula based on SCr of 0.72 mg/dL). Liver Function Tests: Recent Labs  Lab 02/19/20 1728  AST 15  ALT 10  ALKPHOS 57  BILITOT 0.9  PROT 6.0*  ALBUMIN 3.3*   No results for input(s): LIPASE, AMYLASE in the last 168 hours. No results for input(s): AMMONIA in the last 168 hours. Coagulation Profile: Recent Labs  Lab 02/19/20 1728  INR 0.9   Cardiac Enzymes: No results for input(s): CKTOTAL, CKMB, CKMBINDEX, TROPONINI in the last 168 hours. BNP (last 3 results) No results for input(s): PROBNP in the last 8760 hours. HbA1C: No results for input(s): HGBA1C in the last 72 hours. CBG: No results for input(s): GLUCAP in the last 168 hours. Lipid Profile: No results for input(s): CHOL, HDL, LDLCALC, TRIG, CHOLHDL, LDLDIRECT in the last 72 hours. Thyroid Function Tests: No  results for input(s): TSH, T4TOTAL, FREET4, T3FREE, THYROIDAB in the last 72 hours. Anemia Panel: No results for input(s): VITAMINB12, FOLATE, FERRITIN, TIBC, IRON, RETICCTPCT in the last 72 hours. Sepsis Labs: Recent Labs  Lab 02/19/20 1728  LATICACIDVEN 1.5    Recent Results (from the past 240 hour(s))  Urine culture     Status: Abnormal   Collection Time: 02/19/20  5:28 PM   Specimen: In/Out Cath Urine  Result Value Ref Range Status   Specimen Description   Final    IN/OUT  CATH URINE Performed at [facility], [place] [address], [place], [place] [address]    Special Requests   Final    NONE Performed at [facility], [place] [address], [place], [place] [address]    Culture MULTIPLE SPECIES PRESENT, SUGGEST RECOLLECTION (A)  Final   Report Status 02/21/2020 FINAL  Final  Resp Panel by RT-PCR (Flu A&B, Covid) Nasopharyngeal Swab     Status: None   Collection Time: 02/19/20  5:28 PM   Specimen: Nasopharyngeal Swab; Nasopharyngeal(NP) swabs in vial transport medium  Result Value Ref Range Status   SARS Coronavirus 2 by RT PCR NEGATIVE NEGATIVE Final    Comment: (NOTE) SARS-CoV-2 target nucleic acids are NOT DETECTED.  The SARS-CoV-2 RNA is generally detectable in upper respiratory specimens during the acute phase of infection. The lowest concentration of SARS-CoV-2 viral copies this assay can detect is 138 copies/mL. A negative result does not preclude SARS-Cov-2 infection and should not be used as the sole basis for treatment or other patient management decisions. A negative result may occur with  improper specimen collection/handling, submission of specimen other than nasopharyngeal swab, presence of viral mutation(s) within the areas targeted by this assay, and inadequate number of viral copies(<138 copies/mL). A negative result must be combined with clinical observations, patient history, and epidemiological information. The expected  result is Negative.  Fact Sheet for Patients:  EntrepreneurPulse.com.au  Fact Sheet for Healthcare Providers:  IncredibleEmployment.be  This test is no t yet approved or cleared by the [place] FDA and  has been authorized for detection and/or diagnosis of SARS-CoV-2 by FDA under an Emergency Use Authorization (EUA). This EUA will remain  in effect (meaning this test can be used) for the duration of the COVID-19 declaration under Section 564(b)(1) of the Act, 21 U.S.C.section 360bbb-3(b)(1), unless the authorization is terminated  or revoked sooner.       Influenza A by PCR NEGATIVE NEGATIVE Final   Influenza B by PCR NEGATIVE NEGATIVE Final    Comment: (NOTE) The Xpert Xpress SARS-CoV-2/FLU/RSV plus assay is intended as an aid in the diagnosis of influenza from Nasopharyngeal swab specimens and should not be used as a sole basis for treatment. Nasal washings and aspirates are unacceptable for Xpert Xpress SARS-CoV-2/FLU/RSV testing.  Fact Sheet for Patients: EntrepreneurPulse.com.au  Fact Sheet for Healthcare Providers: IncredibleEmployment.be  This test is not yet approved or cleared by the [place] FDA and has been authorized for detection and/or diagnosis of SARS-CoV-2 by FDA under an Emergency Use Authorization (EUA). This EUA will remain in effect (meaning this test can be used) for the duration of the COVID-19 declaration under Section 564(b)(1) of the Act, 21 U.S.C. section 360bbb-3(b)(1), unless the authorization is terminated or revoked.  Performed at [facility], [place] [address] [place], [place] [address]   Blood Culture (routine x 2)     Status: None (Preliminary result)   Collection Time: 02/19/20  6:05 PM   Specimen: BLOOD  Result Value Ref Range Status   Specimen Description   Final    BLOOD RIGHT ANTECUBITAL Performed at [facility] [address], [place], [place] [address]    Special Requests   Final    BOTTLES DRAWN AEROBIC AND ANAEROBIC Blood Culture adequate volume Performed at [facility] [address] [place], [place] [address]    Culture   Final    NO GROWTH 2 DAYS Performed at [facility]  [address] [place], [place] [address]    Report Status PENDING  Incomplete  Blood Culture (routine x 2)     Status: None (Preliminary result)   Collection Time: 02/19/20  6:06 PM   Specimen: BLOOD  Result Value Ref Range Status   Specimen Description   Final    BLOOD LEFT ANTECUBITAL Performed at [facility] [address], [place], [place] [address]    Special Requests   Final    BOTTLES DRAWN AEROBIC AND ANAEROBIC Blood Culture adequate volume Performed at [facility] [address] [place], [place] [address]    Culture   Final    NO GROWTH 2 DAYS Performed at [facility] [address] [place], [place] [address]    Report Status PENDING  Incomplete      Radiology Studies: DG Chest Port 1 View  Result Date: 02/19/2020 CLINICAL DATA:  75 year old female with sepsis. EXAM: PORTABLE CHEST 1 VIEW COMPARISON:  Chest radiograph dated 12/27/2019. FINDINGS: Mild cardiomegaly. Faint left perihilar density may represent mild congestion versus developing infiltrate. There is no pleural effusion pneumothorax. No acute osseous pathology. IMPRESSION: 1. Mild cardiomegaly. 2. Faint left perihilar density may represent mild congestion versus developing infiltrate. Electronically Signed   By: Anner Crete M.D.   On: 02/19/2020 19:13      Scheduled Meds: . enoxaparin (LOVENOX) injection  40 mg Subcutaneous Q24H  . nystatin  5 mL Oral TID  . rosuvastatin  5 mg Oral Daily   Continuous Infusions: . ceFEPime (MAXIPIME) IV 2 g (02/21/20 0844)  . doxycycline (VIBRAMYCIN) IV 100 mg (02/20/20 2242)     LOS: 2 days      Time spent:  25 minutes   Dessa Phi, DO [facility] 02/21/2020, 12:23 PM   Available via Epic secure chat 7am-7pm After these hours, please refer to coverage provider listed on [email or web address]

## 2020-02-22 LAB — CBC
HCT: 27 % — ABNORMAL LOW (ref 36.0–46.0)
Hemoglobin: 8.9 g/dL — ABNORMAL LOW (ref 12.0–15.0)
MCH: 31.9 pg (ref 26.0–34.0)
MCHC: 33 g/dL (ref 30.0–36.0)
MCV: 96.8 fL (ref 80.0–100.0)
Platelets: 145 10*3/uL — ABNORMAL LOW (ref 150–400)
RBC: 2.79 MIL/uL — ABNORMAL LOW (ref 3.87–5.11)
RDW: 16.3 % — ABNORMAL HIGH (ref 11.5–15.5)
WBC: 14.3 10*3/uL — ABNORMAL HIGH (ref 4.0–10.5)
nRBC: 0 % (ref 0.0–0.2)

## 2020-02-22 LAB — BASIC METABOLIC PANEL
Anion gap: 8 (ref 5–15)
BUN: 7 mg/dL — ABNORMAL LOW (ref 8–23)
CO2: 25 mmol/L (ref 22–32)
Calcium: 8 mg/dL — ABNORMAL LOW (ref 8.9–10.3)
Chloride: 105 mmol/L (ref 98–111)
Creatinine, Ser: 0.7 mg/dL (ref 0.44–1.00)
GFR, Estimated: >60 mL/min (ref >60)
Glucose, Bld: 91 mg/dL (ref 70–99)
Potassium: 3.4 mmol/L — ABNORMAL LOW (ref 3.5–5.1)
Sodium: 138 mmol/L (ref 135–145)

## 2020-02-22 LAB — MAGNESIUM: Magnesium: 1.7 mg/dL (ref 1.7–2.4)

## 2020-02-22 MED ORDER — POTASSIUM CHLORIDE CRYS ER 20 MEQ PO TBCR
40.0000 meq | EXTENDED_RELEASE_TABLET | Freq: Once | ORAL | Status: AC
  Administered 2020-02-22: 40 meq via ORAL
  Filled 2020-02-22: qty 2

## 2020-02-22 MED ORDER — DICYCLOMINE HCL 10 MG PO CAPS: 10.0000 mg | ORAL_CAPSULE | Freq: Three times a day (TID) | ORAL | Status: DC

## 2020-02-22 MED ORDER — DICYCLOMINE HCL 10 MG PO CAPS
10.0000 mg | ORAL_CAPSULE | Freq: Three times a day (TID) | ORAL | Status: DC
  Administered 2020-02-22 – 2020-02-23 (×2): 10 mg via ORAL
  Filled 2020-02-22 (×2): qty 1

## 2020-02-22 NOTE — Plan of Care (Signed)
  Problem: Nutrition: Goal: Adequate nutrition will be maintained Outcome: Progressing   

## 2020-02-22 NOTE — Progress Notes (Signed)
PROGRESS NOTE    Brooke [name]  STM:[id number] DOB: [birth year] DOA: 02/19/2020 PCP: Tamsen Roers, MD     Brief Narrative:  Brooke [name] is a 75 y.o. 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 She has been feeling unwell for the last week and developed fever 3 days ago. Had constipation initially and took a laxative followed by diarrhea with the last episode occurring yesterday. She does complain of nonspecific abdominal discomfort. She states she has had fever the past three nights up to 101 F. She has associated chills and night sweats. She has been using tylenol for fever which has controlled fever. She reports having a poor appetite and nausea the past 2-3 days. Her last chemotherapy treatment was a week ago on 02/13/20.  In the emergency department, chest x-ray revealed a left perihilar infiltrate consistent with early pneumonia.  COVID negative.  Patient was started on IV antibiotics and admitted for further treatment and care.  New events last 24 hours / Subjective: Afebrile and no respiratory complaints.  Her main complaint includes abdominal cramping and diarrhea.  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 Irene Limbo -Last  chemotherapy 02/13/2020  HLD -Continue crestor   Abdominal cramping -Trial bentyl   Hypokalemia -Replace, trend     DVT prophylaxis: Lovenox  enoxaparin (LOVENOX) injection 40 mg Start: 02/20/20 2200  Code Status: Full code Family Communication: No family at bedside Disposition Plan:  Status is: Inpatient  Remains inpatient appropriate because:Inpatient level of care appropriate due to severity of illness   Dispo: The patient is from: Home              Anticipated d/c is to: Home              Anticipated d/c date is: 1 day              Patient currently is not medically stable to d/c.  Continue IV antibiotics, replace potassium, trial Bentyl for her GI symptoms     Antimicrobials:  Anti-infectives (From admission, onward)   Start     Dose/Rate Route Frequency Ordered Stop   02/20/20 0800  ceFEPIme (MAXIPIME) 2 g in sodium chloride 0.9 % 100 mL IVPB        2 g 200 mL/hr over 30 Minutes Intravenous Every 12 hours 02/20/20 0338     02/20/20 0800  doxycycline (VIBRAMYCIN) 100 mg in sodium chloride 0.9 % 250 mL IVPB        100 mg 125 mL/hr over 120 Minutes Intravenous Every 12 hours 02/20/20 0338     02/19/20 2000  doxycycline (VIBRA-TABS) tablet 100 mg        100 mg Oral  Once 02/19/20 1953 02/19/20 2019   02/19/20 1930  ceFEPIme (MAXIPIME) 2 g in sodium chloride 0.9 % 100 mL IVPB        2 g 200 mL/hr  over 30 Minutes Intravenous  Once 02/19/20 1923 02/19/20 2213       Objective: Vitals:   02/21/20 1146 02/21/20 2120 02/22/20 0400 02/22/20 0553  BP: 104/76 121/79  117/76  Pulse: 89 (!) 110  98  Resp: (!) 22 20 20 18   Temp: 98.7 F (37.1 C) 99.5 F (37.5 C)  98.3 F (36.8 C)  TempSrc: Oral Oral  Oral  SpO2: 98% 96%  94%  Weight:      Height:        Intake/Output Summary (Last 24 hours) at 02/22/2020 1053 Last data filed at 02/21/2020 1600 Gross per 24 hour  Intake 355.8 ml  Output --  Net 355.8 ml   Filed Weights   02/20/20 0350 02/20/20 0608  Weight:  50.9 kg 50.9 kg    Examination: General exam: Appears calm and comfortable  Respiratory system: Clear to auscultation. Respiratory effort normal. Cardiovascular system: S1 & S2 heard, RRR. No pedal edema. Gastrointestinal system: Abdomen is nondistended, soft and nontender. Normal bowel sounds heard. Central nervous system: Alert and oriented. Non focal exam. Speech clear  Extremities: Symmetric in appearance bilaterally  Skin: No rashes, lesions or ulcers on exposed skin  Psychiatry: Judgement and insight appear stable. Mood & affect appropriate.   Data Reviewed: I have personally reviewed following labs and imaging studies  CBC: Recent Labs  Lab 02/19/20 1728 02/20/20 0949 02/21/20 0559 02/22/20 0548  WBC 2.7* 5.7 14.3* 14.3*  NEUTROABS 1.2*  --   --   --   HGB 9.9* 9.1* 9.3* 8.9*  HCT 29.6* 26.9* 27.9* 27.0*  MCV 96.4 95.7 96.9 96.8  PLT 164 153 145* 643*   Basic Metabolic Panel: Recent Labs  Lab 02/19/20 1728 02/19/20 2352 02/20/20 0949 02/21/20 0559 02/22/20 0548  NA 136  --  138 136 138  K 2.6*  --  3.4* 3.4* 3.4*  CL 98  --  103 104 105  CO2 25  --  24 23 25   GLUCOSE 90  --  77 92 91  BUN 10  --  9 5* 7*  CREATININE 0.87  --  0.54 0.72 0.70  CALCIUM 8.6*  --  8.1* 8.1* 8.0*  MG  --  1.5*  --  1.8 1.7   GFR: Estimated Creatinine Clearance: 50.3 mL/min (by C-G formula based on SCr of 0.7 mg/dL). Liver Function Tests: Recent Labs  Lab 02/19/20 1728  AST 15  ALT 10  ALKPHOS 57  BILITOT 0.9  PROT 6.0*  ALBUMIN 3.3*   No results for input(s): LIPASE, AMYLASE in the last 168 hours. No results for input(s): AMMONIA in the last 168 hours. Coagulation Profile: Recent Labs  Lab 02/19/20 1728  INR 0.9   Cardiac Enzymes: No results for input(s): CKTOTAL, CKMB, CKMBINDEX, TROPONINI in the last 168 hours. BNP (last 3 results) No results for input(s): PROBNP in the last 8760 hours. HbA1C: No results for input(s): HGBA1C in the last 72 hours. CBG: No  results for input(s): GLUCAP in the last 168 hours. Lipid Profile: No results for input(s): CHOL, HDL, LDLCALC, TRIG, CHOLHDL, LDLDIRECT in the last 72 hours. Thyroid Function Tests: No results for input(s): TSH, T4TOTAL, FREET4, T3FREE, THYROIDAB in the last 72 hours. Anemia Panel: No results for input(s): VITAMINB12, FOLATE, FERRITIN, TIBC, IRON, RETICCTPCT in the last 72 hours. Sepsis Labs: Recent Labs  Lab 02/19/20 1728  LATICACIDVEN 1.5    Recent Results (from the past 240 hour(s))  Urine culture     Status: Abnormal  Collection Time: 02/19/20  5:28 PM   Specimen: In/Out Cath Urine  Result Value Ref Range Status   Specimen Description   Final    IN/OUT CATH URINE Performed at [facility], [address], [place], [place] [address]    Special Requests   Final    NONE Performed at [facility], [address] [place], [place] [address]    Culture MULTIPLE SPECIES PRESENT, SUGGEST RECOLLECTION (A)  Final   Report Status 02/21/2020 FINAL  Final  Resp Panel by RT-PCR (Flu A&B, Covid) Nasopharyngeal Swab     Status: None   Collection Time: 02/19/20  5:28 PM   Specimen: Nasopharyngeal Swab; Nasopharyngeal(NP) swabs in vial transport medium  Result Value Ref Range Status   SARS Coronavirus 2 by RT PCR NEGATIVE NEGATIVE Final    Comment: (NOTE) SARS-CoV-2 target nucleic acids are NOT DETECTED.  The SARS-CoV-2 RNA is generally detectable in upper respiratory specimens during the acute phase of infection. The lowest concentration of SARS-CoV-2 viral copies this assay can detect is 138 copies/mL. A negative result does not preclude SARS-Cov-2 infection and should not be used as the sole basis for treatment or other patient management decisions. A negative result may occur with  improper specimen collection/handling, submission of specimen other than nasopharyngeal swab, presence of viral mutation(s) within the areas targeted by this  assay, and inadequate number of viral copies(<138 copies/mL). A negative result must be combined with clinical observations, patient history, and epidemiological information. The expected result is Negative.  Fact Sheet for Patients:  BloggerCourse.com  Fact Sheet for Healthcare Providers:  SeriousBroker.it  This test is no t yet approved or cleared by the [place] FDA and  has been authorized for detection and/or diagnosis of SARS-CoV-2 by FDA under an Emergency Use Authorization (EUA). This EUA will remain  in effect (meaning this test can be used) for the duration of the COVID-19 declaration under Section 564(b)(1) of the Act, 21 U.S.C.section 360bbb-3(b)(1), unless the authorization is terminated  or revoked sooner.       Influenza A by PCR NEGATIVE NEGATIVE Final   Influenza B by PCR NEGATIVE NEGATIVE Final    Comment: (NOTE) The Xpert Xpress SARS-CoV-2/FLU/RSV plus assay is intended as an aid in the diagnosis of influenza from Nasopharyngeal swab specimens and should not be used as a sole basis for treatment. Nasal washings and aspirates are unacceptable for Xpert Xpress SARS-CoV-2/FLU/RSV testing.  Fact Sheet for Patients: BloggerCourse.com  Fact Sheet for Healthcare Providers: SeriousBroker.it  This test is not yet approved or cleared by the [place] FDA and has been authorized for detection and/or diagnosis of SARS-CoV-2 by FDA under an Emergency Use Authorization (EUA). This EUA will remain in effect (meaning this test can be used) for the duration of the COVID-19 declaration under Section 564(b)(1) of the Act, 21 U.S.C. section 360bbb-3(b)(1), unless the authorization is terminated or revoked.  Performed at [facility], [address], [place], [place] [address]   Blood Culture (routine x 2)     Status: None (Preliminary result)    Collection Time: 02/19/20  6:05 PM   Specimen: BLOOD  Result Value Ref Range Status   Specimen Description   Final    BLOOD RIGHT ANTECUBITAL Performed at [facility], [address] [place], [place] [address]    Special Requests   Final    BOTTLES DRAWN AEROBIC AND ANAEROBIC Blood Culture adequate volume Performed at [facility],  [place] [address] [place], [place] [address]    Culture   Final    NO GROWTH 3 DAYS Performed at [facility], [place] [address] [place], [place] [address]    Report Status PENDING  Incomplete  Blood Culture (routine x 2)     Status: None (Preliminary result)   Collection Time: 02/19/20  6:06 PM   Specimen: BLOOD  Result Value Ref Range Status   Specimen Description   Final    BLOOD LEFT ANTECUBITAL Performed at [place] [address] [place], [place] [address]    Special Requests   Final    BOTTLES DRAWN AEROBIC AND ANAEROBIC Blood Culture adequate volume Performed at [place] [address], [place], [place] [address]    Culture   Final    NO GROWTH 3 DAYS Performed at [facility], [place] [address], [place], [place] [address]    Report Status PENDING  Incomplete      Radiology Studies: No results found.    Scheduled Meds: . dicyclomine  10 mg Oral TID AC  . enoxaparin (LOVENOX) injection  40 mg Subcutaneous Q24H  . nystatin  5 mL Oral TID  . rosuvastatin  5 mg Oral Daily   Continuous Infusions: . ceFEPime (MAXIPIME) IV 2 g (02/22/20 0934)  . doxycycline (VIBRAMYCIN) IV 100 mg (02/22/20 1009)     LOS: 3 days      Time spent: 25 minutes   Dessa Phi, DO [facility] 02/22/2020, 10:53 AM   Available via Epic secure chat 7am-7pm After these hours, please refer to coverage provider listed on [email or web address]

## 2020-02-22 NOTE — Care Management Important Message (Signed)
Important Message  Patient Details  IM Letter given to the Patient. Name: Brooke [name] MRN: [id number] Date of Birth: Nov 12, 1945   Medicare Important Message Given:  Yes     Kerin Salen 02/22/2020, 12:36 PM

## 2020-02-23 LAB — BASIC METABOLIC PANEL
Anion gap: 11 (ref 5–15)
BUN: 10 mg/dL (ref 8–23)
CO2: 21 mmol/L — ABNORMAL LOW (ref 22–32)
Calcium: 8.1 mg/dL — ABNORMAL LOW (ref 8.9–10.3)
Chloride: 103 mmol/L (ref 98–111)
Creatinine, Ser: 0.65 mg/dL (ref 0.44–1.00)
GFR, Estimated: >60 mL/min (ref >60)
Glucose, Bld: 88 mg/dL (ref 70–99)
Potassium: 3.3 mmol/L — ABNORMAL LOW (ref 3.5–5.1)
Sodium: 135 mmol/L (ref 135–145)

## 2020-02-23 LAB — CBC
HCT: 26.2 % — ABNORMAL LOW (ref 36.0–46.0)
Hemoglobin: 8.7 g/dL — ABNORMAL LOW (ref 12.0–15.0)
MCH: 32.2 pg (ref 26.0–34.0)
MCHC: 33.2 g/dL (ref 30.0–36.0)
MCV: 97 fL (ref 80.0–100.0)
Platelets: 148 10*3/uL — ABNORMAL LOW (ref 150–400)
RBC: 2.7 MIL/uL — ABNORMAL LOW (ref 3.87–5.11)
RDW: 16.6 % — ABNORMAL HIGH (ref 11.5–15.5)
WBC: 12.1 10*3/uL — ABNORMAL HIGH (ref 4.0–10.5)
nRBC: 0 % (ref 0.0–0.2)

## 2020-02-23 LAB — MAGNESIUM: Magnesium: 1.6 mg/dL — ABNORMAL LOW (ref 1.7–2.4)

## 2020-02-23 MED ORDER — CEPHALEXIN 500 MG PO CAPS
500.0000 mg | ORAL_CAPSULE | Freq: Four times a day (QID) | ORAL | 0 refills | Status: AC
Start: 2020-02-23 — End: 2020-02-26

## 2020-02-23 MED ORDER — POTASSIUM CHLORIDE CRYS ER 20 MEQ PO TBCR
40.0000 meq | EXTENDED_RELEASE_TABLET | Freq: Once | ORAL | Status: AC
  Administered 2020-02-23: 40 meq via ORAL
  Filled 2020-02-23: qty 2

## 2020-02-23 MED ORDER — MAGNESIUM SULFATE 2 GM/50ML IV SOLN
2.0000 g | Freq: Once | INTRAVENOUS | Status: AC
  Administered 2020-02-23: 2 g via INTRAVENOUS
  Filled 2020-02-23: qty 50

## 2020-02-23 MED ORDER — DOXYCYCLINE HYCLATE 50 MG PO CAPS: 50.0000 mg | ORAL_CAPSULE | Freq: Two times a day (BID) | ORAL | 0 refills | Status: AC

## 2020-02-23 NOTE — Plan of Care (Signed)
  Problem: Clinical Measurements: Goal: Ability to maintain clinical measurements within normal limits will improve Outcome: Adequate for Discharge Goal: Will remain free from infection Outcome: Adequate for Discharge Goal: Diagnostic test results will improve Outcome: Adequate for Discharge Goal: Respiratory complications will improve Outcome: Adequate for Discharge Goal: Cardiovascular complication will be avoided Outcome: Adequate for Discharge   

## 2020-02-23 NOTE — Discharge Summary (Signed)
Physician Discharge Summary  Brooke [name] [id number] DOB: Sep 27, 1945 DOA: 02/19/2020  PCP: Tamsen Roers, MD  Admit date: 02/19/2020 Discharge date: 02/23/2020  Admitted From: Home Disposition:  Home  Recommendations for Outpatient Follow-up:  1. Follow up with PCP in 1 week 2. Follow up with oncology as scheduled   Discharge Condition: Stable CODE STATUS: Full  Diet recommendation: Regular   Brief/Interim Summary: Brooke Ostrovsky Shoemakeris a 75 y.o.femalewith medical history significant forMantle cell lymphoma on chemotherapy, HLD, GERD who presents with complaint of weakness andfever. She has been feeling unwell for the last week and developed fever 3 days ago. Had constipation initially and took a laxative followed by diarrheawith thelast episodeoccurringyesterday.She does complain of nonspecificabdominal discomfort. Shestates she has had feverthe past three nightsup to 101 F. She has associated chillsandnight sweats.She has been using tylenol for fever which has controlled fever. She reports having apoor appetite and nausea the past 2-3 days.Her last chemotherapy treatment was a week ago on 02/13/20.  In the emergency department, chest x-ray revealed a left perihilar infiltrate consistent with early pneumonia.  COVID negative.  Patient was started on IV antibiotics and admitted for further treatment and care. She continued to improve symptomatically and remained fever free, remained on room air without distress. She was discharged home in stable condition.   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 Irene Limbo -Last chemotherapy 02/13/2020  HLD -Continue crestor   Abdominal cramping -Resolved   Hypokalemia -Replaced  Hypomagnesemia -Replaced   Discharge Instructions  Discharge Instructions    Call MD for:  difficulty breathing, headache or visual disturbances   Complete by: As directed    Call MD for:  extreme fatigue   Complete by: As directed    Call MD for:  persistant dizziness or light-headedness   Complete by: As directed    Call MD for:  persistant nausea and vomiting   Complete by: As directed    Call MD for:  severe uncontrolled pain   Complete by: As directed    Call MD for:  temperature >100.4   Complete by: As directed    Discharge instructions   Complete by: As directed    You were cared for by a hospitalist during your hospital stay. If you have any questions about your discharge medications or the care you received while you were in the hospital after you are discharged, you can call the unit and ask to speak with the hospitalist on call if the hospitalist that took care of you is not available. Once you are discharged, your primary care physician will handle any further medical issues. Please note that NO REFILLS for any discharge medications will be authorized once you are discharged, as it is imperative that you return to your primary care physician (or establish a relationship with a primary care physician if you do not have one) for your aftercare needs so that they can reassess your need for medications and monitor your lab values.   Increase activity slowly   Complete by: As directed      Allergies as  of 02/23/2020   No Known Allergies     Medication List    STOP taking these medications   acyclovir 400 MG tablet Commonly known as: ZOVIRAX   amoxicillin-clavulanate  875-125 MG tablet Commonly known as: AUGMENTIN   pantoprazole 40 MG tablet Commonly known as: PROTONIX   predniSONE 20 MG tablet Commonly known as: Deltasone   prochlorperazine 10 MG tablet Commonly known as: COMPAZINE   promethazine 25 MG tablet Commonly known as: PHENERGAN     TAKE these medications   acetaminophen 500 MG tablet Commonly known as: TYLENOL Take 1,000 mg by mouth every 6 (six) hours as needed for moderate pain.   cephALEXin 500 MG capsule Commonly known as: KEFLEX Take 1 capsule (500 mg total) by mouth 4 (four) times daily for 3 days.   cyclobenzaprine 10 MG tablet Commonly known as: FLEXERIL Take 10 mg by mouth daily as needed for muscle spasms.   dexamethasone 4 MG tablet Commonly known as: DECADRON Take 2 tablets (8 mg total) by mouth daily. Start 3 days before and continue 2 days after bendamustine/Rituxan chemotherapy for 2 days. Take with food.   diclofenac Sodium 1 % Gel Commonly known as: VOLTAREN Apply 1 application topically 4 (four) times daily.   doxycycline 50 MG capsule Commonly known as: VIBRAMYCIN Take 1 capsule (50 mg total) by mouth 2 (two) times daily for 3 days.   dronabinol 2.5 MG capsule Commonly known as: MARINOL Take 1 capsule (2.5 mg total) by mouth 2 (two) times daily before a meal. What changed:   when to take this  reasons to take this   fluticasone 50 MCG/ACT nasal spray Commonly known as: FLONASE Place 1 spray into both nostrils daily as needed for allergies.   HYDROcodone-acetaminophen 5-325 MG tablet Commonly known as: NORCO/VICODIN Take 1-2 tablets by mouth every 4 (four) hours as needed for moderate pain.   LORazepam 0.5 MG tablet Commonly known as: Ativan Take 1 tablet (0.5 mg total) by mouth every 8 (eight) hours as needed for anxiety (Nausea or vomiting).   nystatin 100000 UNIT/ML suspension Commonly known as: MYCOSTATIN TAKE 5MLS BY MOUTH FOUR TIMES DAILY What changed: See the new instructions.    omeprazole 20 MG capsule Commonly known as: PRILOSEC Take 20 mg by mouth every morning.   ondansetron 8 MG tablet Commonly known as: Zofran Take 1 tablet (8 mg total) by mouth 2 (two) times daily as needed for refractory nausea / vomiting. Start on day 2 after bendamustine chemo.   potassium chloride SA 20 MEQ tablet Commonly known as: KLOR-CON Take 1 tablet (20 mEq total) by mouth daily.   rosuvastatin 5 MG tablet Commonly known as: CRESTOR Take 5 mg by mouth daily.   Vitamin D3 125 MCG (5000 UT) Caps Take 1 capsule by mouth daily.       Follow-up Information    Little, James, MD. Schedule an appointment as soon as possible for a visit in 1 week(s).   Specialty: Family Medicine Contact information: [address] [address] [phone]              No Known Allergies  Consultations:  None    Procedures/Studies: DG Chest Port 1 View  Result Date: 02/19/2020 CLINICAL DATA:  75 year old female with sepsis. EXAM: PORTABLE CHEST 1 VIEW COMPARISON:  Chest radiograph dated 12/27/2019. FINDINGS: Mild cardiomegaly. Faint left perihilar density may represent mild congestion versus developing infiltrate. There is no pleural effusion pneumothorax. No acute osseous pathology. IMPRESSION: 1.  Mild cardiomegaly. 2. Faint left perihilar density may represent mild congestion versus developing infiltrate. Electronically Signed   By: Anner Crete M.D.   On: 02/19/2020 19:13       Discharge Exam: Vitals:   02/23/20 0400 02/23/20 0548  BP:  115/75  Pulse:  91  Resp: 19   Temp:  99.3 F (37.4 C)  SpO2:  96%    General: Pt is alert, awake, not in acute distress Cardiovascular: RRR, S1/S2 +, no edema Respiratory: CTA bilaterally, no wheezing, no rhonchi, no respiratory distress, no conversational dyspnea, on room air  Abdominal: Soft, NT, ND, bowel sounds + Extremities: no edema, no cyanosis Psych: Normal mood and affect, stable judgement and insight      The results of significant diagnostics from this hospitalization (including imaging, microbiology, ancillary and laboratory) are listed below for reference.     Microbiology: Recent Results (from the past 240 hour(s))  Urine culture     Status: Abnormal   Collection Time: 02/19/20  5:28 PM   Specimen: In/Out Cath Urine  Result Value Ref Range Status   Specimen Description   Final    IN/OUT CATH URINE Performed at [facility] [address], [place], [place] [address]    Special Requests   Final    NONE Performed at [facility], [place] [address], [place], [place] [address]    Culture MULTIPLE SPECIES PRESENT, SUGGEST RECOLLECTION (A)  Final   Report Status 02/21/2020 FINAL  Final  Resp Panel by RT-PCR (Flu A&B, Covid) Nasopharyngeal Swab     Status: None   Collection Time: 02/19/20  5:28 PM   Specimen: Nasopharyngeal Swab; Nasopharyngeal(NP) swabs in vial transport medium  Result Value Ref Range Status   SARS Coronavirus 2 by RT PCR NEGATIVE NEGATIVE Final    Comment: (NOTE) SARS-CoV-2 target nucleic acids are NOT DETECTED.  The SARS-CoV-2 RNA is generally detectable in upper respiratory specimens during the acute phase of infection. The lowest concentration of SARS-CoV-2 viral copies this assay can detect is 138 copies/mL. A negative result does not preclude SARS-Cov-2 infection and should not be used as the sole basis for treatment or other patient management decisions. A negative result may occur with  improper specimen collection/handling, submission of specimen other than nasopharyngeal swab, presence of viral mutation(s) within the areas targeted by this assay, and inadequate number of viral copies(<138 copies/mL). A negative result must be combined with clinical observations, patient history, and epidemiological information. The expected result is Negative.  Fact Sheet for Patients:   EntrepreneurPulse.com.au  Fact Sheet for Healthcare Providers:  IncredibleEmployment.be  This test is no t yet approved or cleared by the [place] FDA and  has been authorized for detection and/or diagnosis of SARS-CoV-2 by FDA under an Emergency Use Authorization (EUA). This EUA will remain  in effect (meaning this test can be used) for the duration of the COVID-19 declaration under Section 564(b)(1) of the Act, 21 U.S.C.section 360bbb-3(b)(1), unless the authorization is terminated  or revoked sooner.       Influenza A by PCR NEGATIVE NEGATIVE Final   Influenza B by PCR NEGATIVE NEGATIVE Final    Comment: (NOTE) The Xpert Xpress SARS-CoV-2/FLU/RSV plus assay is intended as an aid in the diagnosis of influenza from Nasopharyngeal swab specimens and should not be used as a sole basis for treatment. Nasal washings and aspirates are unacceptable for Xpert Xpress SARS-CoV-2/FLU/RSV testing.  Fact Sheet for Patients: EntrepreneurPulse.com.au  Fact Sheet for Healthcare Providers:  SeriousBroker.ithttps://www.fda.gov/media/152162/download  This test is not yet approved or cleared by the [place] FDA and has been authorized for detection and/or diagnosis of SARS-CoV-2 by FDA under an Emergency Use Authorization (EUA). This EUA will remain in effect (meaning this test can be used) for the duration of the COVID-19 declaration under Section 564(b)(1) of the Act, 21 U.S.C. section 360bbb-3(b)(1), unless the authorization is terminated or revoked.  Performed at [facility], [address] [place], [place] [address]   Blood Culture (routine x 2)     Status: None (Preliminary result)   Collection Time: 02/19/20  6:05 PM   Specimen: BLOOD  Result Value Ref Range Status   Specimen Description   Final    BLOOD RIGHT ANTECUBITAL Performed at [facility], [address] [place], [place] [address]     Special Requests   Final    BOTTLES DRAWN AEROBIC AND ANAEROBIC Blood Culture adequate volume Performed at [facility], [address] [place], [place] [address]    Culture   Final    NO GROWTH 3 DAYS Performed at [facility], [address] [place], [place] [address]    Report Status PENDING  Incomplete  Blood Culture (routine x 2)     Status: None (Preliminary result)   Collection Time: 02/19/20  6:06 PM   Specimen: BLOOD  Result Value Ref Range Status   Specimen Description   Final    BLOOD LEFT ANTECUBITAL Performed at [facility], [address] [place], [place] [address]    Special Requests   Final    BOTTLES DRAWN AEROBIC AND ANAEROBIC Blood Culture adequate volume Performed at [facility], [address] [place], [place] [address]    Culture   Final    NO GROWTH 3 DAYS Performed at [facility], [address] [place], [place] [address]    Report Status PENDING  Incomplete     Labs: BNP (last 3 results) No results for input(s): BNP in the last 8760 hours. Basic Metabolic Panel: Recent Labs  Lab 02/19/20 1728 02/19/20 2352 02/20/20 0949 02/21/20 0559 02/22/20 0548 02/23/20 0722  NA 136  --  138 136 138 135  K 2.6*  --  3.4* 3.4* 3.4* 3.3*  CL 98  --  103 104 105 103  CO2 25  --  24 23 25  21*  GLUCOSE 90  --  77 92 91 88  BUN 10  --  9 5* 7* 10  CREATININE 0.87  --  0.54 0.72 0.70 0.65  CALCIUM 8.6*  --  8.1* 8.1* 8.0* 8.1*  MG  --  1.5*  --  1.8 1.7 1.6*   Liver Function Tests: Recent Labs  Lab 02/19/20 1728  AST 15  ALT 10  ALKPHOS 57  BILITOT 0.9  PROT 6.0*  ALBUMIN 3.3*   No results for input(s): LIPASE, AMYLASE in the last 168 hours. No results for input(s): AMMONIA in the last 168 hours. CBC: Recent Labs  Lab 02/19/20 1728 02/20/20 0949 02/21/20 0559 02/22/20 0548 02/23/20 0722  WBC 2.7* 5.7 14.3* 14.3* 12.1*  NEUTROABS 1.2*  --   --   --   --   HGB 9.9*  9.1* 9.3* 8.9* 8.7*  HCT 29.6* 26.9* 27.9* 27.0* 26.2*  MCV 96.4 95.7 96.9 96.8 97.0  PLT 164 153 145* 145* 148*   Cardiac Enzymes: No results for input(s): CKTOTAL, CKMB, CKMBINDEX, TROPONINI in the last  168 hours. BNP: Invalid input(s): POCBNP CBG: No results for input(s): GLUCAP in the last 168 hours. D-Dimer No results for input(s): DDIMER in the last 72 hours. Hgb A1c No results for input(s): HGBA1C in the last 72 hours. Lipid Profile No results for input(s): CHOL, HDL, LDLCALC, TRIG, CHOLHDL, LDLDIRECT in the last 72 hours. Thyroid function studies No results for input(s): TSH, T4TOTAL, T3FREE, THYROIDAB in the last 72 hours.  Invalid input(s): FREET3 Anemia work up No results for input(s): VITAMINB12, FOLATE, FERRITIN, TIBC, IRON, RETICCTPCT in the last 72 hours. Urinalysis    Component Value Date/Time   COLORURINE STRAW (A) 02/19/2020 1728   APPEARANCEUR CLEAR 02/19/2020 1728   LABSPEC 1.003 (L) 02/19/2020 1728   PHURINE 7.0 02/19/2020 1728   GLUCOSEU NEGATIVE 02/19/2020 1728   HGBUR NEGATIVE 02/19/2020 1728   BILIRUBINUR NEGATIVE 02/19/2020 1728   KETONESUR NEGATIVE 02/19/2020 1728   PROTEINUR NEGATIVE 02/19/2020 1728   NITRITE NEGATIVE 02/19/2020 1728   LEUKOCYTESUR NEGATIVE 02/19/2020 1728   Sepsis Labs Invalid input(s): PROCALCITONIN,  WBC,  LACTICIDVEN Microbiology Recent Results (from the past 240 hour(s))  Urine culture     Status: Abnormal   Collection Time: 02/19/20  5:28 PM   Specimen: In/Out Cath Urine  Result Value Ref Range Status   Specimen Description   Final    IN/OUT CATH URINE Performed at [facility], [place] [address], [place], [place] [address]    Special Requests   Final    NONE Performed at [facility], [place] [address] [place], [place] [address]    Culture MULTIPLE SPECIES PRESENT, SUGGEST RECOLLECTION (A)  Final   Report Status 02/21/2020 FINAL  Final  Resp Panel by RT-PCR (Flu A&B, Covid)  Nasopharyngeal Swab     Status: None   Collection Time: 02/19/20  5:28 PM   Specimen: Nasopharyngeal Swab; Nasopharyngeal(NP) swabs in vial transport medium  Result Value Ref Range Status   SARS Coronavirus 2 by RT PCR NEGATIVE NEGATIVE Final    Comment: (NOTE) SARS-CoV-2 target nucleic acids are NOT DETECTED.  The SARS-CoV-2 RNA is generally detectable in upper respiratory specimens during the acute phase of infection. The lowest concentration of SARS-CoV-2 viral copies this assay can detect is 138 copies/mL. A negative result does not preclude SARS-Cov-2 infection and should not be used as the sole basis for treatment or other patient management decisions. A negative result may occur with  improper specimen collection/handling, submission of specimen other than nasopharyngeal swab, presence of viral mutation(s) within the areas targeted by this assay, and inadequate number of viral copies(<138 copies/mL). A negative result must be combined with clinical observations, patient history, and epidemiological information. The expected result is Negative.  Fact Sheet for Patients:  EntrepreneurPulse.com.au  Fact Sheet for Healthcare Providers:  IncredibleEmployment.be  This test is no t yet approved or cleared by the [place] FDA and  has been authorized for detection and/or diagnosis of SARS-CoV-2 by FDA under an Emergency Use Authorization (EUA). This EUA will remain  in effect (meaning this test can be used) for the duration of the COVID-19 declaration under Section 564(b)(1) of the Act, 21 U.S.C.section 360bbb-3(b)(1), unless the authorization is terminated  or revoked sooner.       Influenza A by PCR NEGATIVE NEGATIVE Final   Influenza B by PCR NEGATIVE NEGATIVE Final    Comment: (NOTE) The Xpert Xpress SARS-CoV-2/FLU/RSV plus assay is intended as an aid in the diagnosis of influenza from Nasopharyngeal swab specimens and should not  be  used as a sole basis for treatment. Nasal washings and aspirates are unacceptable for Xpert Xpress SARS-CoV-2/FLU/RSV testing.  Fact Sheet for Patients: EntrepreneurPulse.com.au  Fact Sheet for Healthcare Providers: IncredibleEmployment.be  This test is not yet approved or cleared by the [place] FDA and has been authorized for detection and/or diagnosis of SARS-CoV-2 by FDA under an Emergency Use Authorization (EUA). This EUA will remain in effect (meaning this test can be used) for the duration of the COVID-19 declaration under Section 564(b)(1) of the Act, 21 U.S.C. section 360bbb-3(b)(1), unless the authorization is terminated or revoked.  Performed at [facility], [place] [address], [place], [place] [address]   Blood Culture (routine x 2)     Status: None (Preliminary result)   Collection Time: 02/19/20  6:05 PM   Specimen: BLOOD  Result Value Ref Range Status   Specimen Description   Final    BLOOD RIGHT ANTECUBITAL Performed at [facility] [address], [place], [place] [address]    Special Requests   Final    BOTTLES DRAWN AEROBIC AND ANAEROBIC Blood Culture adequate volume Performed at [facility] [address], [place], [place] [address]    Culture   Final    NO GROWTH 3 DAYS Performed at [facility], [place] [address], [place], [place] [address]    Report Status PENDING  Incomplete  Blood Culture (routine x 2)     Status: None (Preliminary result)   Collection Time: 02/19/20  6:06 PM   Specimen: BLOOD  Result Value Ref Range Status   Specimen Description   Final    BLOOD LEFT ANTECUBITAL Performed at [facility] [address] [place], [place] [address]    Special Requests   Final    BOTTLES DRAWN AEROBIC AND ANAEROBIC Blood Culture adequate volume Performed at [facility] [address], [place], [place]  [address]    Culture   Final    NO GROWTH 3 DAYS Performed at [facility], [place] [address] [place], [place] [address]    Report Status PENDING  Incomplete     Patient was seen and examined on the day of discharge and was found to be in stable condition. Time coordinating discharge: 20 minutes including assessment and coordination of care, as well as examination of the patient.   SIGNED:  Dessa Phi, DO [facility] 02/23/2020, 9:21 AM

## 2020-02-23 NOTE — Progress Notes (Signed)
Patient given discharge, medication, and follow up instructions, verbalized understanding, IV and telemetry monitor removed, personal belongings with patient, family to transport home  °

## 2020-02-24 LAB — CULTURE, BLOOD (ROUTINE X 2)
Culture: NO GROWTH
Culture: NO GROWTH
Special Requests: ADEQUATE
Special Requests: ADEQUATE

## 2020-03-10 NOTE — Progress Notes (Signed)
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 is a wonderful woman who is here for evaluation and management of Mantle Cell Lymphoma. The patient's last visit with [place] was on 02/12/2020. The pt reports that she is doing well overall.  The pt reports that she recently had a hospitalization with Pneumonia on 02/19/2020-1/15-2022, but is feeling better now. She no longer has fevers or SOB. The pt notes that she is very fatigued and weak following the last cycle of chemo. She notes that her appetite is slowly increasing, currently at under half her baseline.  Lab results today 03/11/2020 of CBC w/diff and CMP is as follows: all values are WNL except for RBC at 2.83, Hgb of 9.2, HCT of 28.7, MCV of 101.4, RDW of 16.7, Plt of 111K, Lymph Abs of 0.6K, Monocytes Absolute of 1.2K, Abs Immature Granulocytes at 0.10K, Glucose of 100, Total protein of 5.9, Albumin at 3.3. 03/11/2020 Immature Retic Fract of 26.7. Retic Ct Pct normal at 2.3.  On review of systems, pt reports fatigue, weakness and denies fevers, unexplained SOB, leg swelling, abdominal pain, back pain, changes in bowel function and any other symptoms.  REVIEW OF SYSTEMS:   10 Point review of Systems was done is negative except as noted above.  Past Medical History:  Diagnosis Date  . Allergy   . Arthritis   . Back pain   . Cancer ([place])   . Complication of anesthesia    bp dropped yrs ago, recent surgeries went ok  . Dysrhythmia    occ, no meds for  . GERD  (gastroesophageal reflux disease)   . H. pylori infection   . Headache    migraines occ  . Hiatal hernia   . Hx of adenomatous colonic polyps 11/30/2017  . Hyperlipidemia   . Hypothyroidism 1970's   hx of years ago, no meds for   . Shortness of breath dyspnea    with exertion, pt told comes from acid reflux    . Past Surgical History:  Procedure Laterality Date  . APPENDECTOMY    . COLONOSCOPY  12/07/2013  . DILATION AND CURETTAGE OF UTERUS    . LAPAROSCOPIC CHOLECYSTECTOMY SINGLE SITE WITH INTRAOPERATIVE CHOLANGIOGRAM N/A 10/28/2015   Procedure: LAPAROSCOPIC CHOLECYSTECTOMY WITH INTRAOPERATIVE CHOLANGIOGRAM;  Surgeon: Armandina Gemma, MD;  Location: [facility];  Service: General;  Laterality: N/A;  . LAPAROSCOPY  yrs ago   x 2  . TMJ ARTHROSCOPY    . TOTAL ABDOMINAL HYSTERECTOMY     complete    . Social History   Tobacco Use  . Smoking status: Never Smoker  . Smokeless tobacco: Never Used  Vaping Use  . Vaping Use: Never used  Substance Use Topics  . Alcohol use: Yes    Alcohol/week: 4.0 - 5.0 standard drinks    Types: 4 - 5 Cans of beer per week    Comment: 4-5 beers weekly-occ per pt  . Drug use: No    ALLERGIES:  has No Known Allergies.  MEDICATIONS:  Current  Outpatient Medications  Medication Sig Dispense Refill  . acetaminophen (TYLENOL) 500 MG tablet Take 1,000 mg by mouth every 6 (six) hours as needed for moderate pain.    . Cholecalciferol (VITAMIN D3) 5000 units CAPS Take 1 capsule by mouth daily.    . cyclobenzaprine (FLEXERIL) 10 MG tablet Take 10 mg by mouth daily as needed for muscle spasms.    [name] Kitchen dexamethasone (DECADRON) 4 MG tablet Take 2 tablets (8 mg total) by mouth daily. Start 3 days before and continue 2 days after bendamustine/Rituxan chemotherapy for 2 days. Take with food. 30 tablet 1  . diclofenac Sodium (VOLTAREN) 1 % GEL Apply 1 application topically 4 (four) times daily.    [name] Kitchen dronabinol (MARINOL) 2.5 MG capsule Take 1 capsule (2.5 mg total) by  mouth 2 (two) times daily before a meal. (Patient taking differently: Take 2.5 mg by mouth 2 (two) times daily as needed (nausea).) 60 capsule 0  . fluticasone (FLONASE) 50 MCG/ACT nasal spray Place 1 spray into both nostrils daily as needed for allergies.    [name] Kitchen HYDROcodone-acetaminophen (NORCO/VICODIN) 5-325 MG tablet Take 1-2 tablets by mouth every 4 (four) hours as needed for moderate pain. 20 tablet 0  . LORazepam (ATIVAN) 0.5 MG tablet Take 1 tablet (0.5 mg total) by mouth every 8 (eight) hours as needed for anxiety (Nausea or vomiting). 30 tablet 0  . nystatin (MYCOSTATIN) 100000 UNIT/ML suspension TAKE 5MLS BY MOUTH FOUR TIMES DAILY (Patient taking differently: Take 5 mLs by mouth 3 (three) times daily as needed (infection).) 180 mL 0  . omeprazole (PRILOSEC) 20 MG capsule Take 20 mg by mouth every morning.    . ondansetron (ZOFRAN) 8 MG tablet Take 1 tablet (8 mg total) by mouth 2 (two) times daily as needed for refractory nausea / vomiting. Start on day 2 after bendamustine chemo. 30 tablet 1  . potassium chloride SA (KLOR-CON) 20 MEQ tablet Take 1 tablet (20 mEq total) by mouth daily. 30 tablet 1  . rosuvastatin (CRESTOR) 5 MG tablet Take 5 mg by mouth daily.     No current facility-administered medications for this visit.    PHYSICAL EXAMINATION:   GENERAL:alert, in no acute distress and comfortable SKIN: no acute rashes, no significant lesions EYES: conjunctiva are pink and non-injected, sclera anicteric OROPHARYNX: MMM, no exudates, no oropharyngeal erythema or ulceration NECK: supple, no JVD LYMPH:  no palpable lymphadenopathy in the cervical, axillary or inguinal regions LUNGS: clear to auscultation b/l with normal respiratory effort HEART: regular rate & rhythm ABDOMEN:  normoactive bowel sounds , non tender, not distended. Extremity: no pedal edema PSYCH: alert & oriented x 3 with fluent speech NEURO: no focal motor/sensory deficits  LABORATORY DATA:   I have reviewed  the data as listed  . CBC Latest Ref Rng & Units 03/11/2020 02/23/2020 02/22/2020  WBC 4.0 - 10.5 K/uL 4.9 12.1(H) 14.3(H)  Hemoglobin 12.0 - 15.0 g/dL 9.2(L) 8.7(L) 8.9(L)  Hematocrit 36.0 - 46.0 % 28.7(L) 26.2(L) 27.0(L)  Platelets 150 - 400 K/uL 111(L) 148(L) 145(L)    . CMP Latest Ref Rng & Units 03/11/2020 02/23/2020 02/22/2020  Glucose 70 - 99 mg/dL 100(H) 88 91  BUN 8 - 23 mg/dL 11 10 7(L)  Creatinine 0.44 - 1.00 mg/dL 0.97 0.65 0.70  Sodium 135 - 145 mmol/L 139 135 138  Potassium 3.5 - 5.1 mmol/L 4.3 3.3(L) 3.4(L)  Chloride 98 - 111 mmol/L 106 103 105  CO2 22 - 32 mmol/L 27 21(L) 25  Calcium 8.9 -  10.3 mg/dL 9.4 8.1(L) 8.0(L)  Total Protein 6.5 - 8.1 g/dL 5.9(L) - -  Total Bilirubin 0.3 - 1.2 mg/dL 0.4 - -  Alkaline Phos 38 - 126 U/L 54 - -  AST 15 - 41 U/L 16 - -  ALT 0 - 44 U/L 9 - -   . Lab Results  Component Value Date   LDH 159 01/07/2020      RADIOGRAPHIC STUDIES: I have personally reviewed the radiological images as listed and agreed with the findings in the report. DG Chest Port 1 View  Result Date: 02/19/2020 CLINICAL DATA:  75 year old female with sepsis. EXAM: PORTABLE CHEST 1 VIEW COMPARISON:  Chest radiograph dated 12/27/2019. FINDINGS: Mild cardiomegaly. Faint left perihilar density may represent mild congestion versus developing infiltrate. There is no pleural effusion pneumothorax. No acute osseous pathology. IMPRESSION: 1. Mild cardiomegaly. 2. Faint left perihilar density may represent mild congestion versus developing infiltrate. Electronically Signed   By: Anner Crete M.D.   On: 02/19/2020 19:13    ASSESSMENT & PLAN:   75 yo with   1) Stage III/IV Mantle cell lymphoma 01/11/2020 PET/CT ([id number]) revealed no visible disease, continued resolution. 2) s/p Allergic reaction to Rapid Rituxan. - rash and low grade fever -  resolved with solumedrol/tylenol, benadryl and famotidine  PLAN: -Discussed pt labwork today, 03/11/2020; Hgb beginning to  improving, WBC normal, Plt borderline low, monocytes elevated,blood chemistries stable.  -Advised pt the next 2 months are for recovery and eating well. Will get repeat scans in 3 months. -Discussed potential plan with patient following scan results-- maintenance Rituxan for 2-3 years versus just monitoring.  -Recommend pt eat well, drink 48-64 oz water daily, and walk 20-30 min daily. Get restful sleep. -The pt has no prohibitive toxicities from continuing BR at this time. The pt has completed her five cycles.  -Will hold C6 Bendamustine/Rituxan due to increased risk of infection and pt's exceptional response so far.  -Recommend pt continue Lorazepam and hold Xanax at this time. -Recommend yogurt with live cultures or OTC Probiotic. -Hold Potassium pill at this time. -Take B-complex vitamin daily. -Continue 2000 IU Vitamin D daily. -Continue Claritin nightly -Will see back in 10 weeks with labs.   FOLLOW UP: Pet/ct in 9 weeks RTC with Dr Irene Limbo with labs in 10 weeks    The total time spent in the appointment was 30 minutes and more than 50% was on counseling and direct patient cares.  All of the patient's questions were answered with apparent satisfaction. The patient knows to call the clinic with any problems, questions or concerns.    Sullivan Lone MD [place] AAHIVMS [facility] Hematology/Oncology Physician [facility]  (Office):       [phone] (Work cell):  [phone] (Fax):           [phone]  I, Reinaldo Raddle, am acting as scribe for Dr. Sullivan Lone, MD.     .I have reviewed the above documentation for accuracy and completeness, and I agree with the above. Brunetta Genera MD

## 2020-03-11 ENCOUNTER — Other Ambulatory Visit: Payer: Self-pay

## 2020-03-11 ENCOUNTER — Inpatient Hospital Stay: Payer: Medicare Other | Admitting: Hematology

## 2020-03-11 ENCOUNTER — Inpatient Hospital Stay: Payer: Medicare Other | Attending: Hematology

## 2020-03-11 ENCOUNTER — Ambulatory Visit: Payer: Medicare HMO

## 2020-03-11 VITALS — BP 104/69 | HR 98 | Temp 97.0°F | Resp 18 | Ht 63.0 in | Wt 111.3 lb

## 2020-03-11 DIAGNOSIS — C8318 Mantle cell lymphoma, lymph nodes of multiple sites: Secondary | ICD-10-CM | POA: Diagnosis not present

## 2020-03-11 DIAGNOSIS — C8594 Non-Hodgkin lymphoma, unspecified, lymph nodes of axilla and upper limb: Secondary | ICD-10-CM | POA: Diagnosis not present

## 2020-03-11 LAB — CBC WITH DIFFERENTIAL/PLATELET
Abs Immature Granulocytes: 0.1 10*3/uL — ABNORMAL HIGH (ref 0.00–0.07)
Basophils Absolute: 0.1 10*3/uL (ref 0.0–0.1)
Basophils Relative: 1 %
Eosinophils Absolute: 0.1 10*3/uL (ref 0.0–0.5)
Eosinophils Relative: 3 %
HCT: 28.7 % — ABNORMAL LOW (ref 36.0–46.0)
Hemoglobin: 9.2 g/dL — ABNORMAL LOW (ref 12.0–15.0)
Immature Granulocytes: 2 %
Lymphocytes Relative: 12 %
Lymphs Abs: 0.6 10*3/uL — ABNORMAL LOW (ref 0.7–4.0)
MCH: 32.5 pg (ref 26.0–34.0)
MCHC: 32.1 g/dL (ref 30.0–36.0)
MCV: 101.4 fL — ABNORMAL HIGH (ref 80.0–100.0)
Monocytes Absolute: 1.2 10*3/uL — ABNORMAL HIGH (ref 0.1–1.0)
Monocytes Relative: 24 %
Neutro Abs: 2.9 10*3/uL (ref 1.7–7.7)
Neutrophils Relative %: 58 %
Platelets: 111 10*3/uL — ABNORMAL LOW (ref 150–400)
RBC: 2.83 MIL/uL — ABNORMAL LOW (ref 3.87–5.11)
RDW: 16.7 % — ABNORMAL HIGH (ref 11.5–15.5)
WBC: 4.9 10*3/uL (ref 4.0–10.5)
nRBC: 0 % (ref 0.0–0.2)

## 2020-03-11 LAB — CMP (CANCER CENTER ONLY)
ALT: 9 U/L (ref 0–44)
AST: 16 U/L (ref 15–41)
Albumin: 3.3 g/dL — ABNORMAL LOW (ref 3.5–5.0)
Alkaline Phosphatase: 54 U/L (ref 38–126)
Anion gap: 6 (ref 5–15)
BUN: 11 mg/dL (ref 8–23)
CO2: 27 mmol/L (ref 22–32)
Calcium: 9.4 mg/dL (ref 8.9–10.3)
Chloride: 106 mmol/L (ref 98–111)
Creatinine: 0.97 mg/dL (ref 0.44–1.00)
GFR, Estimated: >60 mL/min (ref >60)
Glucose, Bld: 100 mg/dL — ABNORMAL HIGH (ref 70–99)
Potassium: 4.3 mmol/L (ref 3.5–5.1)
Sodium: 139 mmol/L (ref 135–145)
Total Bilirubin: 0.4 mg/dL (ref 0.3–1.2)
Total Protein: 5.9 g/dL — ABNORMAL LOW (ref 6.5–8.1)

## 2020-03-11 LAB — RETICULOCYTES
Immature Retic Fract: 26.7 % — ABNORMAL HIGH (ref 2.3–15.9)
RBC.: 2.88 MIL/uL — ABNORMAL LOW (ref 3.87–5.11)
Retic Count, Absolute: 65.4 10*3/uL (ref 19.0–186.0)
Retic Ct Pct: 2.3 % (ref 0.4–3.1)

## 2020-03-12 ENCOUNTER — Ambulatory Visit: Payer: Medicare HMO

## 2020-03-14 ENCOUNTER — Ambulatory Visit: Payer: Medicare HMO

## 2020-03-17 NOTE — Progress Notes (Incomplete)
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 Brager is a wonderful woman who is here for evaluation and management of Mantle Cell Lymphoma. The patient's last visit with [place] was on 02/12/2020. The pt reports that she is doing well overall.  The pt reports that she recently had a hospitalization with Pneumonia on 02/19/2020-1/15-2022, but is feeling better now. She no longer has fevers or SOB. The pt notes that she is very fatigued and weak following the last cycle of chemo. She notes that her appetite is slowly increasing, currently at under half her baseline.  Lab results today 03/11/2020 of CBC w/diff and CMP is as follows: all values are WNL except for RBC at 2.83, Hgb of 9.2, HCT of 28.7, MCV of 101.4, RDW of 16.7, Plt of 111K, Lymph Abs of 0.6K, Monocytes Absolute of 1.2K, Abs Immature Granulocytes at 0.10K, Glucose of 100, Total protein of 5.9, Albumin at 3.3. 03/11/2020 Immature Retic Fract of 26.7. Retic Ct Pct normal at 2.3.  On review of systems, pt reports fatigue, weakness and denies fevers, unexplained SOB, leg swelling, abdominal pain, back pain, changes in bowel function and any other symptoms.  REVIEW OF SYSTEMS:   10 Point review of Systems was done is negative except as noted above.  Past Medical History:  Diagnosis Date  . Allergy   . Arthritis   . Back pain   . Cancer ([place])   . Complication of anesthesia    bp dropped yrs ago, recent surgeries went ok  . Dysrhythmia    occ, no meds for  . GERD  (gastroesophageal reflux disease)   . H. pylori infection   . Headache    migraines occ  . Hiatal hernia   . Hx of adenomatous colonic polyps 11/30/2017  . Hyperlipidemia   . Hypothyroidism 1970's   hx of years ago, no meds for   . Shortness of breath dyspnea    with exertion, pt told comes from acid reflux    . Past Surgical History:  Procedure Laterality Date  . APPENDECTOMY    . COLONOSCOPY  12/07/2013  . DILATION AND CURETTAGE OF UTERUS    . LAPAROSCOPIC CHOLECYSTECTOMY SINGLE SITE WITH INTRAOPERATIVE CHOLANGIOGRAM N/A 10/28/2015   Procedure: LAPAROSCOPIC CHOLECYSTECTOMY WITH INTRAOPERATIVE CHOLANGIOGRAM;  Surgeon: Armandina Gemma, MD;  Location: [facility];  Service: General;  Laterality: N/A;  . LAPAROSCOPY  yrs ago   x 2  . TMJ ARTHROSCOPY    . TOTAL ABDOMINAL HYSTERECTOMY     complete    . Social History   Tobacco Use  . Smoking status: Never Smoker  . Smokeless tobacco: Never Used  Vaping Use  . Vaping Use: Never used  Substance Use Topics  . Alcohol use: Yes    Alcohol/week: 4.0 - 5.0 standard drinks    Types: 4 - 5 Cans of beer per week    Comment: 4-5 beers weekly-occ per pt  . Drug use: No    ALLERGIES:  has No Known Allergies.  MEDICATIONS:  Current  Outpatient Medications  Medication Sig Dispense Refill  . acetaminophen (TYLENOL) 500 MG tablet Take 1,000 mg by mouth every 6 (six) hours as needed for moderate pain.    . Cholecalciferol (VITAMIN D3) 5000 units CAPS Take 1 capsule by mouth daily.    . cyclobenzaprine (FLEXERIL) 10 MG tablet Take 10 mg by mouth daily as needed for muscle spasms.    [name] Kitchen dexamethasone (DECADRON) 4 MG tablet Take 2 tablets (8 mg total) by mouth daily. Start 3 days before and continue 2 days after bendamustine/Rituxan chemotherapy for 2 days. Take with food. 30 tablet 1  . diclofenac Sodium (VOLTAREN) 1 % GEL Apply 1 application topically 4 (four) times daily.    [name] Kitchen dronabinol (MARINOL) 2.5 MG capsule Take 1 capsule (2.5 mg total) by  mouth 2 (two) times daily before a meal. (Patient taking differently: Take 2.5 mg by mouth 2 (two) times daily as needed (nausea).) 60 capsule 0  . fluticasone (FLONASE) 50 MCG/ACT nasal spray Place 1 spray into both nostrils daily as needed for allergies.    [name] Kitchen HYDROcodone-acetaminophen (NORCO/VICODIN) 5-325 MG tablet Take 1-2 tablets by mouth every 4 (four) hours as needed for moderate pain. 20 tablet 0  . LORazepam (ATIVAN) 0.5 MG tablet Take 1 tablet (0.5 mg total) by mouth every 8 (eight) hours as needed for anxiety (Nausea or vomiting). 30 tablet 0  . nystatin (MYCOSTATIN) 100000 UNIT/ML suspension TAKE 5MLS BY MOUTH FOUR TIMES DAILY (Patient taking differently: Take 5 mLs by mouth 3 (three) times daily as needed (infection).) 180 mL 0  . omeprazole (PRILOSEC) 20 MG capsule Take 20 mg by mouth every morning.    . ondansetron (ZOFRAN) 8 MG tablet Take 1 tablet (8 mg total) by mouth 2 (two) times daily as needed for refractory nausea / vomiting. Start on day 2 after bendamustine chemo. 30 tablet 1  . potassium chloride SA (KLOR-CON) 20 MEQ tablet Take 1 tablet (20 mEq total) by mouth daily. 30 tablet 1  . rosuvastatin (CRESTOR) 5 MG tablet Take 5 mg by mouth daily.     No current facility-administered medications for this visit.    PHYSICAL EXAMINATION:   GENERAL:alert, in no acute distress and comfortable SKIN: no acute rashes, no significant lesions EYES: conjunctiva are pink and non-injected, sclera anicteric OROPHARYNX: MMM, no exudates, no oropharyngeal erythema or ulceration NECK: supple, no JVD LYMPH:  no palpable lymphadenopathy in the cervical, axillary or inguinal regions LUNGS: clear to auscultation b/l with normal respiratory effort HEART: regular rate & rhythm ABDOMEN:  normoactive bowel sounds , non tender, not distended. Extremity: no pedal edema PSYCH: alert & oriented x 3 with fluent speech NEURO: no focal motor/sensory deficits  LABORATORY DATA:   I have reviewed  the data as listed  . CBC Latest Ref Rng & Units 03/11/2020 02/23/2020 02/22/2020  WBC 4.0 - 10.5 K/uL 4.9 12.1(H) 14.3(H)  Hemoglobin 12.0 - 15.0 g/dL 9.2(L) 8.7(L) 8.9(L)  Hematocrit 36.0 - 46.0 % 28.7(L) 26.2(L) 27.0(L)  Platelets 150 - 400 K/uL 111(L) 148(L) 145(L)    . CMP Latest Ref Rng & Units 03/11/2020 02/23/2020 02/22/2020  Glucose 70 - 99 mg/dL 100(H) 88 91  BUN 8 - 23 mg/dL 11 10 7(L)  Creatinine 0.44 - 1.00 mg/dL 0.97 0.65 0.70  Sodium 135 - 145 mmol/L 139 135 138  Potassium 3.5 - 5.1 mmol/L 4.3 3.3(L) 3.4(L)  Chloride 98 - 111 mmol/L 106 103 105  CO2 22 - 32 mmol/L 27 21(L) 25  Calcium 8.9 -  10.3 mg/dL 9.4 8.1(L) 8.0(L)  Total Protein 6.5 - 8.1 g/dL 5.9(L) - -  Total Bilirubin 0.3 - 1.2 mg/dL 0.4 - -  Alkaline Phos 38 - 126 U/L 54 - -  AST 15 - 41 U/L 16 - -  ALT 0 - 44 U/L 9 - -   . Lab Results  Component Value Date   LDH 159 01/07/2020      RADIOGRAPHIC STUDIES: I have personally reviewed the radiological images as listed and agreed with the findings in the report. DG Chest Port 1 View  Result Date: 02/19/2020 CLINICAL DATA:  75 year old female with sepsis. EXAM: PORTABLE CHEST 1 VIEW COMPARISON:  Chest radiograph dated 12/27/2019. FINDINGS: Mild cardiomegaly. Faint left perihilar density may represent mild congestion versus developing infiltrate. There is no pleural effusion pneumothorax. No acute osseous pathology. IMPRESSION: 1. Mild cardiomegaly. 2. Faint left perihilar density may represent mild congestion versus developing infiltrate. Electronically Signed   By: Anner Crete M.D.   On: 02/19/2020 19:13    ASSESSMENT & PLAN:   75 yo with   1) Stage III/IV Mantle cell lymphoma 01/11/2020 PET/CT ([id number]) revealed no visible disease, continued resolution. 2) s/p Allergic reaction to Rapid Rituxan. - rash and low grade fever -  resolved with solumedrol/tylenol, benadryl and famotidine  PLAN: -Discussed pt labwork today, 03/11/2020; Hgb beginning to  improving, WBC normal, Plt borderline low, monocytes elevated,blood chemistries stable.  -Advised pt the next 2 months are for recovery and eating well. Will get repeat scans in 3 months. -Discussed potential plan with patient following scan results-- maintenance Rituxan for 2-3 years versus just monitoring.  -Recommend pt eat well, drink 48-64 oz water daily, and walk 20-30 min daily. Get restful sleep. -The pt has no prohibitive toxicities from continuing BR at this time. The pt has completed her five cycles.  -Will hold C6 Bendamustine/Rituxan due to increased risk of infection and pt's exceptional response so far.  -Recommend pt continue Lorazepam and hold Xanax at this time. -Recommend yogurt with live cultures or OTC Probiotic. -Hold Potassium pill at this time. -Take B-complex vitamin daily. -Continue 2000 IU Vitamin D daily. -Continue Claritin nightly -Will see back in 10 weeks with labs.   FOLLOW UP: Pet/ct in 9 weeks RTC with Dr Irene Limbo with labs in 10 weeks    The total time spent in the appointment was 20 minutes and more than 50% was on counseling and direct patient cares.  All of the patient's questions were answered with apparent satisfaction. The patient knows to call the clinic with any problems, questions or concerns.    Sullivan Lone MD [place] AAHIVMS [facility] Hematology/Oncology Physician [facility]  (Office):       [phone] (Work cell):  [phone] (Fax):           [phone]  I, Reinaldo Raddle, am acting as scribe for Dr. Sullivan Lone, MD.

## 2020-04-28 ENCOUNTER — Ambulatory Visit (INDEPENDENT_AMBULATORY_CARE_PROVIDER_SITE_OTHER): Payer: Medicare Other

## 2020-04-28 ENCOUNTER — Other Ambulatory Visit: Payer: Self-pay

## 2020-04-28 ENCOUNTER — Encounter: Payer: Self-pay | Admitting: Internal Medicine

## 2020-04-28 ENCOUNTER — Ambulatory Visit (INDEPENDENT_AMBULATORY_CARE_PROVIDER_SITE_OTHER): Payer: Medicare Other | Admitting: Internal Medicine

## 2020-04-28 VITALS — BP 118/80 | HR 80 | Temp 98.2°F | Resp 16 | Ht 63.0 in | Wt 117.0 lb

## 2020-04-28 DIAGNOSIS — I700 Atherosclerosis of aorta: Secondary | ICD-10-CM

## 2020-04-28 DIAGNOSIS — Z0000 Encounter for general adult medical examination without abnormal findings: Secondary | ICD-10-CM

## 2020-04-28 DIAGNOSIS — Z23 Encounter for immunization: Secondary | ICD-10-CM | POA: Insufficient documentation

## 2020-04-28 DIAGNOSIS — E785 Hyperlipidemia, unspecified: Secondary | ICD-10-CM | POA: Insufficient documentation

## 2020-04-28 DIAGNOSIS — R059 Cough, unspecified: Secondary | ICD-10-CM | POA: Diagnosis not present

## 2020-04-28 DIAGNOSIS — J189 Pneumonia, unspecified organism: Secondary | ICD-10-CM | POA: Diagnosis not present

## 2020-04-28 DIAGNOSIS — Z1231 Encounter for screening mammogram for malignant neoplasm of breast: Secondary | ICD-10-CM

## 2020-04-28 DIAGNOSIS — D539 Nutritional anemia, unspecified: Secondary | ICD-10-CM | POA: Diagnosis not present

## 2020-04-28 DIAGNOSIS — Z0001 Encounter for general adult medical examination with abnormal findings: Secondary | ICD-10-CM

## 2020-04-28 HISTORY — DX: Encounter for screening mammogram for malignant neoplasm of breast: Z12.31

## 2020-04-28 HISTORY — DX: Atherosclerosis of aorta: I70.0

## 2020-04-28 LAB — CBC WITH DIFFERENTIAL/PLATELET
Basophils Absolute: 0 10*3/uL (ref 0.0–0.1)
Basophils Relative: 0.8 % (ref 0.0–3.0)
Eosinophils Absolute: 0 10*3/uL (ref 0.0–0.7)
Eosinophils Relative: 1.2 % (ref 0.0–5.0)
HCT: 29.8 % — ABNORMAL LOW (ref 36.0–46.0)
Hemoglobin: 10.2 g/dL — ABNORMAL LOW (ref 12.0–15.0)
Lymphocytes Relative: 10.2 % — ABNORMAL LOW (ref 12.0–46.0)
Lymphs Abs: 0.4 10*3/uL — ABNORMAL LOW (ref 0.7–4.0)
MCHC: 34.4 g/dL (ref 30.0–36.0)
MCV: 99.4 fl (ref 78.0–100.0)
Monocytes Absolute: 0.9 10*3/uL (ref 0.1–1.0)
Monocytes Relative: 22.3 % — ABNORMAL HIGH (ref 3.0–12.0)
Neutro Abs: 2.6 10*3/uL (ref 1.4–7.7)
Neutrophils Relative %: 65.5 % (ref 43.0–77.0)
Platelets: 196 10*3/uL (ref 150.0–400.0)
RBC: 2.99 Mil/uL — ABNORMAL LOW (ref 3.87–5.11)
RDW: 15.3 % (ref 11.5–15.5)
WBC: 4 10*3/uL (ref 4.0–10.5)

## 2020-04-28 LAB — LIPID PANEL
Cholesterol: 161 mg/dL (ref 0–200)
HDL: 62.3 mg/dL (ref >39.00)
LDL Cholesterol: 73 mg/dL (ref 0–99)
NonHDL: 98.46
Total CHOL/HDL Ratio: 3
Triglycerides: 128 mg/dL (ref 0.0–149.0)
VLDL: 25.6 mg/dL (ref 0.0–40.0)

## 2020-04-28 LAB — IRON: Iron: 76 ug/dL (ref 42–145)

## 2020-04-28 LAB — FOLATE: Folate: >23.6 ng/mL (ref >5.9)

## 2020-04-28 LAB — FERRITIN: Ferritin: 340.5 ng/mL — ABNORMAL HIGH (ref 10.0–291.0)

## 2020-04-28 LAB — VITAMIN B12: Vitamin B-12: 751 pg/mL (ref 211–911)

## 2020-04-28 LAB — TSH: TSH: 3.35 u[IU]/mL (ref 0.35–4.50)

## 2020-04-28 NOTE — Patient Instructions (Signed)
[employer] medicine (25th ed., pp. [phone]). [place], [place]: Elsevier.">  Anemia  Anemia is a condition in which there is not enough red blood cells or hemoglobin in the blood. Hemoglobin is a substance in red blood cells that carries oxygen. When you do not have enough red blood cells or hemoglobin (are anemic), your body cannot get enough oxygen and your organs may not work properly. As a result, you may feel very tired or have other problems. What are the causes? Common causes of anemia include:  Excessive bleeding. Anemia can be caused by excessive bleeding inside or outside the body, including bleeding from the intestines or from heavy menstrual periods in females.  Poor nutrition.  Long-lasting (chronic) kidney, thyroid, and liver disease.  Bone marrow disorders, spleen problems, and blood disorders.  Cancer and treatments for cancer.  HIV (human immunodeficiency virus) and AIDS (acquired immunodeficiency syndrome).  Infections, medicines, and autoimmune disorders that destroy red blood cells. What are the signs or symptoms? Symptoms of this condition include:  Minor weakness.  Dizziness.  Headache, or difficulties concentrating and sleeping.  Heartbeats that feel irregular or faster than normal (palpitations).  Shortness of breath, especially with exercise.  Pale skin, lips, and nails, or cold hands and feet.  Indigestion and nausea. Symptoms may occur suddenly or develop slowly. If your anemia is mild, you may not have symptoms. How is this diagnosed? This condition is diagnosed based on blood tests, your medical history, and a physical exam. In some cases, a test may be needed in which cells are removed from the soft tissue inside of a bone and looked at under a microscope (bone marrow biopsy). Your health care provider may also check your stool (feces) for blood and may do additional testing to look for the cause of your bleeding. Other tests may  include:  Imaging tests, such as a CT scan or MRI.  A procedure to see inside your esophagus and stomach (endoscopy).  A procedure to see inside your colon and rectum (colonoscopy). How is this treated? Treatment for this condition depends on the cause. If you continue to lose a lot of blood, you may need to be treated at a hospital. Treatment may include:  Taking supplements of iron, vitamin Q68, or folic acid.  Taking a hormone medicine (erythropoietin) that can help to stimulate red blood cell growth.  Having a blood transfusion. This may be needed if you lose a lot of blood.  Making changes to your diet.  Having surgery to remove your spleen. Follow these instructions at home:  Take over-the-counter and prescription medicines only as told by your health care provider.  Take supplements only as told by your health care provider.  Follow any diet instructions that you were given by your health care provider.  Keep all follow-up visits as told by your health care provider. This is important. Contact a health care provider if:  You develop new bleeding anywhere in the body. Get help right away if:  You are very weak.  You are short of breath.  You have pain in your abdomen or chest.  You are dizzy or feel faint.  You have trouble concentrating.  You have bloody stools, black stools, or tarry stools.  You vomit repeatedly or you vomit up blood. These symptoms may represent a serious problem that is an emergency. Do not wait to see if the symptoms will go away. Get medical help right away. Call your local emergency services (911 in the [place] Do not  drive yourself to the hospital. Summary  Anemia is a condition in which you do not have enough red blood cells or enough of a substance in your red blood cells that carries oxygen (hemoglobin).  Symptoms may occur suddenly or develop slowly.  If your anemia is mild, you may not have symptoms.  This condition is  diagnosed with blood tests, a medical history, and a physical exam. Other tests may be needed.  Treatment for this condition depends on the cause of the anemia. This information is not intended to replace advice given to you by your health care provider. Make sure you discuss any questions you have with your health care provider. Document Revised: 01/02/2019 Document Reviewed: 01/02/2019 Elsevier Patient Education  2021 [employer].  

## 2020-04-28 NOTE — Progress Notes (Unsigned)
Subjective:  Patient ID: Brooke [name], female    DOB: 05/23/1945  Age: 75 y.o. MRN: [id number]  CC: Anemia and Annual Exam  This visit occurred during the SARS-CoV-2 public health emergency.  Safety protocols were in place, including screening questions prior to the visit, additional usage of staff PPE, and extensive cleaning of exam room while observing appropriate contact time as indicated for disinfecting solutions.    HPI Brooke [name] presents for a CPX.  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  She was admitted about 2 months ago for pneumonia.  She tells me she feels much better but continues to have a mild, intermittent nonproductive cough and shortness of breath.  During her admission she was found to be anemic.  History Brooke [name] has a past medical history of Allergy, Arthritis, Back pain, Cancer ([place]), Complication of anesthesia, Dysrhythmia, GERD (gastroesophageal reflux disease), H. pylori infection, Headache, Hiatal hernia, adenomatous colonic polyps (11/30/2017), Hyperlipidemia, Hypothyroidism (1970's), and Shortness of breath dyspnea.   She has a past surgical history that includes TMJ Arthroscopy; Appendectomy; Dilation and curettage of uterus; Total abdominal hysterectomy; laparoscopy (yrs ago); Laparoscopic cholecystectomy single site with intraoperative cholangiogram (N/A, 10/28/2015); and Colonoscopy (12/07/2013).   Her family history includes Breast cancer in her cousin and maternal aunt; Colon cancer in her father and paternal grandfather; Colon polyps in her brother and sister; Kidney cancer in her brother; Lung cancer in her brother; Ovarian cancer in her sister.She reports that she has never smoked. She has never used smokeless tobacco. She reports current alcohol use of about 4.0 - 5.0 standard drinks of alcohol per week. She reports that she does not use drugs.  Outpatient  Medications Prior to Visit  Medication Sig Dispense Refill  . acetaminophen (TYLENOL) 500 MG tablet Take 1,000 mg by mouth every 6 (six) hours as needed for moderate pain.    . Cholecalciferol (VITAMIN D3) 5000 units CAPS Take 1 capsule by mouth daily.    . cyclobenzaprine (FLEXERIL) 10 MG tablet Take 10 mg by mouth daily as needed for muscle spasms.    [name] Kitchen dexamethasone (DECADRON) 4 MG tablet Take 2 tablets (8 mg total) by mouth daily. Start 3 days before and continue 2 days after bendamustine/Rituxan chemotherapy for 2 days. Take with food. 30 tablet 1  . diclofenac Sodium (VOLTAREN) 1 % GEL Apply 1 application topically 4 (four) times daily.    [name] Kitchen dronabinol (MARINOL) 2.5 MG capsule Take 1 capsule (2.5 mg total) by mouth 2 (two) times daily before a meal. (Patient taking differently: Take 2.5 mg by mouth 2 (two) times daily as needed (nausea).) 60 capsule 0  . fluticasone (FLONASE) 50 MCG/ACT nasal spray Place 1 spray into both nostrils daily as needed for allergies.    [name] Kitchen HYDROcodone-acetaminophen (NORCO/VICODIN) 5-325 MG tablet Take 1-2 tablets by mouth every 4 (four) hours as needed for moderate pain. 20 tablet 0  . LORazepam (ATIVAN) 0.5 MG tablet Take 1 tablet (0.5 mg total) by mouth every 8 (eight) hours as needed for anxiety (Nausea or vomiting). 30 tablet 0  . nystatin (MYCOSTATIN) 100000 UNIT/ML suspension TAKE 5MLS BY MOUTH FOUR TIMES DAILY (Patient taking differently: Take 5 mLs by mouth 3 (three) times daily as needed (infection).) 180 mL 0  . omeprazole (PRILOSEC) 20 MG capsule Take 20 mg by mouth every morning.    . ondansetron (ZOFRAN) 8 MG tablet Take 1 tablet (8 mg total) by  mouth 2 (two) times daily as needed for refractory nausea / vomiting. Start on day 2 after bendamustine chemo. 30 tablet 1  . potassium chloride SA (KLOR-CON) 20 MEQ tablet Take 1 tablet (20 mEq total) by mouth daily. 30 tablet 1  . rosuvastatin (CRESTOR) 5 MG tablet Take 5 mg by mouth daily.     No  facility-administered medications prior to visit.    ROS Review of Systems  Constitutional: Negative for appetite change, chills, diaphoresis, fatigue and fever.  HENT: Negative.   Respiratory: Positive for cough and shortness of breath. Negative for chest tightness and wheezing.   Cardiovascular: Negative for chest pain, palpitations and leg swelling.  Gastrointestinal: Negative for abdominal pain, constipation and diarrhea.  Genitourinary: Negative.  Negative for difficulty urinating.  Musculoskeletal: Negative.  Negative for arthralgias.  Skin: Negative.   Neurological: Negative.  Negative for dizziness, weakness and light-headedness.  Hematological: Negative for adenopathy. Does not bruise/bleed easily.  Psychiatric/Behavioral: Negative.     Objective:  BP 118/80   Pulse 80   Temp 98.2 F (36.8 C) (Oral)   Resp 16   Ht 5\' 3"  (1.6 m)   Wt 117 lb (53.1 kg)   SpO2 98%   BMI 20.73 kg/m   Physical Exam Vitals reviewed.  Constitutional:      Appearance: Normal appearance.  HENT:     Nose: Nose normal.     Mouth/Throat:     Mouth: Mucous membranes are moist.  Eyes:     General: No scleral icterus.    Conjunctiva/sclera: Conjunctivae normal.  Cardiovascular:     Rate and Rhythm: Normal rate and regular rhythm.     Heart sounds: No murmur heard.   Pulmonary:     Effort: Pulmonary effort is normal.     Breath sounds: No stridor. No wheezing, rhonchi or rales.  Abdominal:     General: Abdomen is flat. Bowel sounds are normal. There is no distension.     Palpations: Abdomen is soft. There is no hepatomegaly, splenomegaly or mass.     Tenderness: There is no abdominal tenderness.  Musculoskeletal:        General: Normal range of motion.     Cervical back: Neck supple.     Right lower leg: No edema.     Left lower leg: No edema.  Lymphadenopathy:     Cervical: No cervical adenopathy.  Skin:    General: Skin is warm and dry.     Coloration: Skin is not pale.   Neurological:     General: No focal deficit present.     Mental Status: She is alert.     Lab Results  Component Value Date   WBC 4.0 04/28/2020   HGB 10.2 (L) 04/28/2020   HCT 29.8 (L) 04/28/2020   PLT 196.0 04/28/2020   GLUCOSE 100 (H) 03/11/2020   CHOL 161 04/28/2020   TRIG 128.0 04/28/2020   HDL 62.30 04/28/2020   LDLCALC 73 04/28/2020   ALT 9 03/11/2020   AST 16 03/11/2020   NA 139 03/11/2020   K 4.3 03/11/2020   CL 106 03/11/2020   CREATININE 0.97 03/11/2020   BUN 11 03/11/2020   CO2 27 03/11/2020   TSH 3.35 04/28/2020   INR 0.9 02/19/2020   DG Chest 2 View  Result Date: 04/29/2020 CLINICAL DATA:  Pneumonia follow-up. Pneumonia 2 months ago. Mild lingering cough. EXAM: CHEST - 2 VIEW COMPARISON:  Chest radiograph 02/19/2020. FINDINGS: Previous left perihilar airspace disease has resolved. No definite residual.  No new or progressive consolidation. Heart is normal in size. No pleural fluid, pneumothorax or pulmonary edema. Exaggerated thoracic kyphosis. Mild chronic midthoracic and lower thoracic compression fractures. IMPRESSION: Resolution of left perihilar pneumonia.  No residual. Electronically Signed   By: Keith Rake M.D.   On: 04/29/2020 16:42    Assessment & Plan:   Brooke [name] was seen today for anemia and annual exam.  Diagnoses and all orders for this visit:  Atherosclerosis of aorta ([place])- She has a low ASCVD risk score so I did not recommend a statin for CV risk reduction. -     Lipid panel; Future -     Lipid panel  Hyperlipidemia with target LDL less than 130- See above. 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  I will screen her for vitamin deficiencies. -     Vitamin B12; Future -     Folate; Future -     Ferritin; Future -     Iron; Future -     CBC with Differential/Platelet; Future -     CBC with Differential/Platelet -     Iron -     Ferritin -     Folate -      Vitamin B12  Need for vaccination -     Pneumococcal conjugate vaccine 20-valent (Prevnar 20)  Community acquired pneumonia of left lower lobe of lung- Based on her symptoms, exam, and chest x-ray this has resolved. -     DG Chest 2 View; Future  Cough -     DG Chest 2 View; Future  Visit for screening mammogram -     MM DIGITAL SCREENING BILATERAL; Future  Encounter for general adult medical examination with abnormal findings- Exam completed, labs reviewed, vaccines reviewed and updated, cancer screenings addressed, patient education was given.  Other orders -     Tdap vaccine greater than or equal to 7yo IM   I am having Brooke Cal. Conyer "Brooke [name]" maintain her rosuvastatin, Vitamin D3, HYDROcodone-acetaminophen, cyclobenzaprine, ondansetron, dexamethasone, omeprazole, fluticasone, diclofenac Sodium, potassium chloride SA, dronabinol, LORazepam, nystatin, and acetaminophen.  No orders of the defined types were placed in this encounter.    Follow-up: Return in about 3 months (around 07/29/2020).  Scarlette Calico, MD

## 2020-05-01 DIAGNOSIS — Z0001 Encounter for general adult medical examination with abnormal findings: Secondary | ICD-10-CM

## 2020-05-01 HISTORY — DX: Encounter for general adult medical examination with abnormal findings: Z00.01

## 2020-05-15 ENCOUNTER — Ambulatory Visit (HOSPITAL_COMMUNITY)
Admission: RE | Admit: 2020-05-15 | Discharge: 2020-05-15 | Disposition: A | Discharge status: Home / Self Care | Payer: Medicare Other | Source: Ambulatory Visit | Attending: Hematology | Admitting: Hematology

## 2020-05-15 ENCOUNTER — Other Ambulatory Visit: Payer: Self-pay

## 2020-05-15 DIAGNOSIS — C8318 Mantle cell lymphoma, lymph nodes of multiple sites: Secondary | ICD-10-CM | POA: Diagnosis not present

## 2020-05-15 DIAGNOSIS — C969 Malignant neoplasm of lymphoid, hematopoietic and related tissue, unspecified: Secondary | ICD-10-CM | POA: Diagnosis not present

## 2020-05-15 LAB — GLUCOSE, CAPILLARY: Glucose-Capillary: 100 mg/dL — ABNORMAL HIGH (ref 70–99)

## 2020-05-15 MED ORDER — FLUDEOXYGLUCOSE F - 18 (FDG) INJECTION
5.9000 | Freq: Once | INTRAVENOUS | Status: AC | PRN
  Administered 2020-05-15: 5.9 via INTRAVENOUS

## 2020-05-19 NOTE — Progress Notes (Signed)
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 Scorza is a wonderful woman who is here for evaluation and management of Mantle Cell Lymphoma. The patient's last visit with [place] was on 03/11/2020. The pt reports that she is doing well overall.  The pt reports that she has been gradually improving and eating better. The pt notes that she has been gaining weight. The pt notes that Dr. Hilarie Fredrickson is her GI and she was cleared for five years during her last endoscopy three years ago.   Of note since the patient's last visit, pt has had PET Skull Base to Thigh on 05/15/2020, which revealed "No FDG avid disease in the neck, chest, abdomen or pelvis. Segmental uptake in the RIGHT hemicolon with colonic under distension is nonspecific, associated with questionable surrounding stranding. Correlate with any signs of colitis. Small free fluid in the pelvis, unusual in a patient of this age but only minimally increased when compared to the previous exam. Tiny 4 mm LEFT upper lobe pulmonary nodule.  Attention on follow-up. Calcified coronary artery disease and aortic atherosclerosis. Post cholecystectomy and hysterectomy. Aortic Atherosclerosis (ICD10-I70.0)."  Lab results today 05/20/2020 of CBC w/diff and CMP is as follows: all values are WNL except for RBC of 3.20, Hgb of 10.7, Hct of 32.0, Lymphs Abs of 0.5K. 05/20/2020 LDH of 240.  On review of systems, pt reports weight gain, chronic loose bowels and denies new lumps/bumps, fever, chills,  night sweats, abdominal cramping, abdominal pain, inflammation issues, acute back pain, changes in breathing, and any other symptoms.  REVIEW OF SYSTEMS:   10 Point review of Systems was done is negative except as noted above.  Past Medical History:  Diagnosis Date  . Allergy   . Arthritis   . Back pain   . Cancer ([place])   . Complication of anesthesia    bp dropped yrs ago, recent surgeries went ok  . Dysrhythmia    occ, no meds for  . GERD (gastroesophageal reflux disease)   . H. pylori infection   . Headache    migraines occ  . Hiatal hernia   . Hx of adenomatous colonic polyps 11/30/2017  . Hyperlipidemia   . Hypothyroidism 1970's   hx of years ago, no meds for   . Shortness of breath dyspnea    with exertion, pt told comes from acid reflux    . Past Surgical History:  Procedure Laterality Date  . APPENDECTOMY    . COLONOSCOPY  12/07/2013  . DILATION AND CURETTAGE OF UTERUS    . LAPAROSCOPIC CHOLECYSTECTOMY SINGLE SITE WITH INTRAOPERATIVE CHOLANGIOGRAM N/A 10/28/2015   Procedure: LAPAROSCOPIC CHOLECYSTECTOMY WITH INTRAOPERATIVE CHOLANGIOGRAM;  Surgeon: Armandina Gemma, MD;  Location: [facility];  Service: General;  Laterality: N/A;  . LAPAROSCOPY  yrs ago   x 2  . TMJ ARTHROSCOPY    . TOTAL ABDOMINAL HYSTERECTOMY     complete    . Social History   Tobacco Use  . Smoking status: Never Smoker  . Smokeless tobacco: Never Used  Vaping Use  . Vaping Use: Never used  Substance Use Topics  . Alcohol use: Yes    Alcohol/week: 4.0 - 5.0 standard drinks    Types: 4 - 5 Cans of beer per week    Comment: 4-5 beers weekly-occ per pt  . Drug use: No    ALLERGIES:  has No Known Allergies.  MEDICATIONS:  Current Outpatient Medications  Medication Sig Dispense Refill  . acetaminophen (TYLENOL) 500 MG tablet Take 1,000 mg by mouth every 6 (six) hours as needed for moderate pain.    . Cholecalciferol (VITAMIN D3) 5000 units CAPS Take 1 capsule by mouth daily.    .  cyclobenzaprine (FLEXERIL) 10 MG tablet Take 10 mg by mouth daily as needed for muscle spasms.    [name] Kitchen dexamethasone (DECADRON) 4 MG tablet Take 2 tablets (8 mg total) by mouth daily. Start 3 days before and continue 2 days after bendamustine/Rituxan chemotherapy for 2 days. Take with food. 30 tablet 1  . diclofenac Sodium (VOLTAREN) 1 % GEL Apply 1 application topically 4 (four) times daily.    [name] Kitchen dronabinol (MARINOL) 2.5 MG capsule TAKE 1 CAPSULE BY MOUTH 2 TIMES DAILY BEFORE A MEAL (Patient taking differently: Take 2.5 mg by mouth 2 (two) times daily as needed (nausea).) 60 capsule 0  . fluticasone (FLONASE) 50 MCG/ACT nasal spray Place 1 spray into both nostrils daily as needed for allergies.    [name] Kitchen HYDROcodone-acetaminophen (NORCO/VICODIN) 5-325 MG tablet Take 1-2 tablets by mouth every 4 (four) hours as needed for moderate pain. 20 tablet 0  . levofloxacin (LEVAQUIN) 750 MG tablet TAKE 1 TABLET BY MOUTH ONCE A DAY FOR 10 DAYS 10 tablet 0  . LORazepam (ATIVAN) 0.5 MG tablet TAKE 1 TABLET BY MOUTH EVERY 8 HOURS AS NEEDED FOR ANXIETY (NAUSEA OR VOMITING). 30 tablet 0  . nystatin (MYCOSTATIN) 100000 UNIT/ML suspension TAKE 5MLS BY MOUTH FOUR TIMES DAILY (Patient taking differently: Take 5 mLs by mouth 3 (three) times daily as needed (infection).) 180 mL 0  . omeprazole (PRILOSEC) 20 MG capsule Take 20 mg by mouth every morning.    . ondansetron (ZOFRAN) 8 MG tablet Take 1 tablet (8 mg total) by mouth 2 (two) times daily as needed for refractory nausea / vomiting. Start on day 2 after bendamustine chemo. 30 tablet 1  . potassium chloride SA (KLOR-CON) 20 MEQ tablet TAKE 1 TABLET BY MOUTH ONCE A DAY 30 tablet 1  . rosuvastatin (CRESTOR) 5 MG tablet Take 5 mg by mouth daily.     No current facility-administered medications for this visit.    PHYSICAL EXAMINATION:  NAD. GENERAL:alert, in no acute distress and comfortable SKIN: no acute rashes, no significant lesions EYES: conjunctiva are pink and  non-injected, sclera anicteric OROPHARYNX: MMM, no exudates, no oropharyngeal erythema or ulceration NECK: supple, no JVD LYMPH:  no palpable lymphadenopathy in the cervical, axillary or inguinal regions LUNGS: clear to auscultation b/l with normal respiratory effort HEART: regular rate & rhythm ABDOMEN:  normoactive bowel sounds , non tender, not distended. Extremity: no pedal edema PSYCH: alert & oriented x 3 with fluent speech NEURO: no focal motor/sensory deficits   LABORATORY DATA:   I have reviewed the data as listed  . CBC Latest Ref Rng & Units 05/20/2020 04/28/2020 03/11/2020  WBC 4.0 - 10.5 K/uL 4.7 4.0 4.9  Hemoglobin 12.0 - 15.0 g/dL 10.7(L) 10.2(L) 9.2(L)  Hematocrit 36.0 - 46.0 % 32.0(L) 29.8(L) 28.7(L)  Platelets 150 - 400 K/uL 174 196.0 111(L)    .  CMP Latest Ref Rng & Units 05/20/2020 03/11/2020 02/23/2020  Glucose 70 - 99 mg/dL 94 100(H) 88  BUN 8 - 23 mg/dL 11 11 10   Creatinine 0.44 - 1.00 mg/dL 0.91 0.97 0.65  Sodium 135 - 145 mmol/L 143 139 135  Potassium 3.5 - 5.1 mmol/L 4.7 4.3 3.3(L)  Chloride 98 - 111 mmol/L 106 106 103  CO2 22 - 32 mmol/L 26 27 21(L)  Calcium 8.9 - 10.3 mg/dL 9.6 9.4 8.1(L)  Total Protein 6.5 - 8.1 g/dL 6.7 5.9(L) -  Total Bilirubin 0.3 - 1.2 mg/dL 0.5 0.4 -  Alkaline Phos 38 - 126 U/L 62 54 -  AST 15 - 41 U/L 23 16 -  ALT 0 - 44 U/L 11 9 -   . Lab Results  Component Value Date   LDH 240 (H) 05/20/2020      RADIOGRAPHIC STUDIES: I have personally reviewed the radiological images as listed and agreed with the findings in the report. DG Chest 2 View  Result Date: 04/29/2020 CLINICAL DATA:  Pneumonia follow-up. Pneumonia 2 months ago. Mild lingering cough. EXAM: CHEST - 2 VIEW COMPARISON:  Chest radiograph 02/19/2020. FINDINGS: Previous left perihilar airspace disease has resolved. No definite residual. No new or progressive consolidation. Heart is normal in size. No pleural fluid, pneumothorax or pulmonary edema. Exaggerated  thoracic kyphosis. Mild chronic midthoracic and lower thoracic compression fractures. IMPRESSION: Resolution of left perihilar pneumonia.  No residual. Electronically Signed   By: Keith Rake M.D.   On: 04/29/2020 16:42   NM PET Image Restag (PS) Skull Base To Thigh  Result Date: 05/16/2020 CLINICAL DATA:  Subsequent treatment strategy for hematologic malignancy, history of mantle cell lymphoma in a 75 year old female. EXAM: NUCLEAR MEDICINE PET SKULL BASE TO THIGH TECHNIQUE: 5.9 mCi F-18 FDG was injected intravenously. Full-ring PET imaging was performed from the skull base to thigh after the radiotracer. CT data was obtained and used for attenuation correction and anatomic localization. Fasting blood glucose: 100 mg/dl COMPARISON:  Multiple prior studies most recent of January 11, 2020 FINDINGS: Mediastinal blood pool activity: SUV max 1.79 Liver activity: SUV max 3.48 NECK: No hypermetabolic lymph nodes in the neck. Incidental CT findings: none CHEST: No hypermetabolic mediastinal or hilar nodes. No suspicious pulmonary nodules on the CT scan. Tiny 4 mm nodule in the LEFT upper lobe stable since August of 2021. No hypermetabolic nodule in the chest. Incidental CT findings: Calcified coronary artery disease, three-vessel disease. Mitral annular calcifications. Normal heart size without pericardial effusion. Normal caliber thoracic aorta. Normal caliber central pulmonary vasculature. Limited assessment of cardiovascular structures given lack of intravenous contrast. Esophagus grossly normal. Mild atelectasis. ABDOMEN/PELVIS: No abnormal hypermetabolic activity within the liver, pancreas, adrenal glands, or spleen. No hypermetabolic lymph nodes in the abdomen or pelvis. Incidental CT findings: Small presumed hepatic cyst LEFT hepatic lobe unchanged. Post cholecystectomy. Pancreas, spleen, adrenal glands and kidneys without acute process. No acute no definite acute gastrointestinal findings. RIGHT colon is  collapsed. Mildly indistinct fat about the RIGHT colon and segmental increased FDG uptake greater than that of surrounding bowel. Trace free fluid in the pelvis following hysterectomy, nonspecific and while unusual in a patient of this age only minimally increased compared to prior imaging calcified atheromatous plaque of the abdominal aorta without aneurysmal dilation. There is no gastrohepatic or hepatoduodenal ligament lymphadenopathy. No retroperitoneal or mesenteric lymphadenopathy. Groin lymph nodes seen on previous imaging have resolved. SKELETON: No focal hypermetabolic activity to suggest skeletal metastasis. Incidental CT findings: Spinal degenerative changes.  Glenohumeral degenerative changes on the LEFT. IMPRESSION: No FDG avid disease in the neck, chest, abdomen or pelvis. Segmental uptake in the RIGHT hemicolon with colonic under distension is nonspecific, associated with questionable surrounding stranding. Correlate with any signs of colitis. Small free fluid in the pelvis, unusual in a patient of this age but only minimally increased when compared to the previous exam. Tiny 4 mm LEFT upper lobe pulmonary nodule.  Attention on follow-up. Calcified coronary artery disease and aortic atherosclerosis. Post cholecystectomy and hysterectomy. These results will be called to the ordering clinician or representative by the Radiologist Assistant, and communication documented in the PACS or [employer]. Aortic Atherosclerosis (ICD10-I70.0). Electronically Signed   By: Zetta Bills M.D.   On: 05/16/2020 15:23    ASSESSMENT & PLAN:   75 yo with   1) Stage III/IV Mantle cell lymphoma 01/11/2020 PET/CT ([id number]) revealed no visible disease, continued resolution. 2) s/p Allergic reaction to Rapid Rituxan. - rash and low grade fever -  resolved with solumedrol/tylenol, benadryl and famotidine   PLAN: -Discussed pt labwork today, 05/20/2020; blood counts normalizing, chemistries normal. LDH  elevated unrelated. -Discussed pt PET Skull Base to Thigh on 05/15/2020; pt is in remission and no findings related to lymphoma. -Recommended pt f/u w Dr. Hilarie Fredrickson regarding potential earlier colonoscopy and to get a GI opinion on results. -Advised pt that borderline elevated LDh can be due to inflammation or many other reactive non-specific findings. -Advised pt that it would be okay to return to church with a mask or family beach trip. -Discussed CDC guidelines and recommended pt receive the second COVID booster. -Discussed Ev[facility] and pt's eligibility. Will send referral. Recommended pt get this prior to second booster. -Discussed maintenance Rituxan versus just monitoring. The pt is agreeable to Rituxan q44months. -Will get repeat scans in 6 months. -Take B-complex vitamin daily. -Continue 2000 IU Vitamin D daily. -Will see back in 2.5 months with labs (early July).   FOLLOW UP: Plz schedule to start Maintenance Rituxan , labs and MD visit in 3 months Ambulatory referral for Ev[facility]   The total time spent in the appointment was 20 minutes and more than 50% was on counseling and direct patient cares.  All of the patient's questions were answered with apparent satisfaction. The patient knows to call the clinic with any problems, questions or concerns.    Sullivan Lone MD [place] AAHIVMS [facility] Hematology/Oncology Physician [facility]  (Office):       [phone] (Work cell):  [phone] (Fax):           [phone]  I, Reinaldo Raddle, am acting as scribe for Dr. Sullivan Lone, MD.    .I have reviewed the above documentation for accuracy and completeness, and I agree with the above. Brunetta Genera MD

## 2020-05-20 ENCOUNTER — Inpatient Hospital Stay: Payer: Medicare Other | Attending: Hematology

## 2020-05-20 ENCOUNTER — Inpatient Hospital Stay: Payer: Medicare Other | Admitting: Hematology

## 2020-05-20 ENCOUNTER — Other Ambulatory Visit: Payer: Self-pay

## 2020-05-20 VITALS — BP 126/68 | HR 83 | Temp 98.7°F | Resp 17 | Ht 63.0 in | Wt 117.8 lb

## 2020-05-20 DIAGNOSIS — C8318 Mantle cell lymphoma, lymph nodes of multiple sites: Secondary | ICD-10-CM

## 2020-05-20 DIAGNOSIS — C8594 Non-Hodgkin lymphoma, unspecified, lymph nodes of axilla and upper limb: Secondary | ICD-10-CM | POA: Insufficient documentation

## 2020-05-20 LAB — CBC WITH DIFFERENTIAL/PLATELET
Abs Immature Granulocytes: 0.07 10*3/uL (ref 0.00–0.07)
Basophils Absolute: 0 10*3/uL (ref 0.0–0.1)
Basophils Relative: 1 %
Eosinophils Absolute: 0.1 10*3/uL (ref 0.0–0.5)
Eosinophils Relative: 2 %
HCT: 32 % — ABNORMAL LOW (ref 36.0–46.0)
Hemoglobin: 10.7 g/dL — ABNORMAL LOW (ref 12.0–15.0)
Immature Granulocytes: 2 %
Lymphocytes Relative: 11 %
Lymphs Abs: 0.5 10*3/uL — ABNORMAL LOW (ref 0.7–4.0)
MCH: 33.4 pg (ref 26.0–34.0)
MCHC: 33.4 g/dL (ref 30.0–36.0)
MCV: 100 fL (ref 80.0–100.0)
Monocytes Absolute: 0.9 10*3/uL (ref 0.1–1.0)
Monocytes Relative: 20 %
Neutro Abs: 3.1 10*3/uL (ref 1.7–7.7)
Neutrophils Relative %: 64 %
Platelets: 174 10*3/uL (ref 150–400)
RBC: 3.2 MIL/uL — ABNORMAL LOW (ref 3.87–5.11)
RDW: 12.8 % (ref 11.5–15.5)
WBC: 4.7 10*3/uL (ref 4.0–10.5)
nRBC: 0 % (ref 0.0–0.2)

## 2020-05-20 LAB — CMP (CANCER CENTER ONLY)
ALT: 11 U/L (ref 0–44)
AST: 23 U/L (ref 15–41)
Albumin: 4.3 g/dL (ref 3.5–5.0)
Alkaline Phosphatase: 62 U/L (ref 38–126)
Anion gap: 11 (ref 5–15)
BUN: 11 mg/dL (ref 8–23)
CO2: 26 mmol/L (ref 22–32)
Calcium: 9.6 mg/dL (ref 8.9–10.3)
Chloride: 106 mmol/L (ref 98–111)
Creatinine: 0.91 mg/dL (ref 0.44–1.00)
GFR, Estimated: >60 mL/min (ref >60)
Glucose, Bld: 94 mg/dL (ref 70–99)
Potassium: 4.7 mmol/L (ref 3.5–5.1)
Sodium: 143 mmol/L (ref 135–145)
Total Bilirubin: 0.5 mg/dL (ref 0.3–1.2)
Total Protein: 6.7 g/dL (ref 6.5–8.1)

## 2020-05-20 LAB — LACTATE DEHYDROGENASE: LDH: 240 U/L — ABNORMAL HIGH (ref 98–192)

## 2020-05-30 ENCOUNTER — Other Ambulatory Visit: Payer: Self-pay | Admitting: Physician Assistant

## 2020-05-30 ENCOUNTER — Telehealth: Payer: Self-pay | Admitting: Hematology

## 2020-05-30 DIAGNOSIS — C8318 Mantle cell lymphoma, lymph nodes of multiple sites: Secondary | ICD-10-CM

## 2020-05-30 NOTE — Telephone Encounter (Signed)
Scheduled appt per 4/22 sch msg. Pt aware.  

## 2020-05-30 NOTE — Progress Notes (Signed)
I connected by phone with [name] on 05/30/2020, 9:40 AM to discuss the potential use of a new treatment, tixagevimab/cilgavimab, for pre-exposure prophylaxis for prevention of coronavirus disease 2019 (COVID-19) caused by the SARS-CoV-2 virus.  This patient is a 75 y.o. female that meets the FDA criteria for Emergency Use Authorization of tixagevimab/cilgavimab for pre-exposure prophylaxis of COVID-19 disease. Pt meets following criteria:  Age >12 yr and weight > 40kg  Not currently infected with SARS-CoV-2 and has no known recent exposure to an individual infected with SARS-CoV-2 AND o Who has moderate to severe immune compromise due to a medical condition or receipt of immunosuppressive medications or treatments and may not mount an adequate immune response to COVID-19 vaccination or  o Vaccination with any available COVID-19 vaccine, according to the approved or authorized schedule, is not recommended due to a history of severe adverse reaction (e.g., severe allergic reaction) to a COVID-19 vaccine(s) and/or COVID-19 vaccine component(s).  o Patient meets the following definition of mod-severe immune compromised status: 6. Other actively treated hematologic malignancies or severe congenital immunodeficiency syndromes  I have spoken and communicated the following to the patient or parent/caregiver regarding COVID monoclonal antibody treatment:  1. FDA has authorized the emergency use of tixagevimab/cilgavimab for the pre-exposure prophylaxis of COVID-19 in patients with moderate-severe immunocompromised status, who meet above EUA criteria.  2. The significant known and potential risks and benefits of COVID monoclonal antibody, and the extent to which such potential risks and benefits are unknown.  3. Information on available alternative treatments and the risks and benefits of those alternatives, including clinical trials.  4. The patient or parent/caregiver has the option to  accept or refuse COVID monoclonal antibody treatment.  After reviewing this information with the patient, agree to receive tixagevimab/cilgavimab  Brooke Form, PA-C, 05/30/2020, 9:40 AM

## 2020-06-04 ENCOUNTER — Telehealth: Payer: Self-pay | Admitting: Hematology

## 2020-06-04 NOTE — Telephone Encounter (Signed)
Scheduled follow-up appointment per 4/22 los. Patient is aware. 

## 2020-06-06 ENCOUNTER — Other Ambulatory Visit: Payer: Self-pay

## 2020-06-06 ENCOUNTER — Other Ambulatory Visit: Payer: Self-pay | Admitting: Physician Assistant

## 2020-06-06 ENCOUNTER — Inpatient Hospital Stay: Payer: Medicare Other

## 2020-06-06 DIAGNOSIS — C8318 Mantle cell lymphoma, lymph nodes of multiple sites: Secondary | ICD-10-CM

## 2020-06-06 MED ORDER — CILGAVIMAB (PART OF EVUSHELD) INJECTION
300.0000 mg | Freq: Once | INTRAMUSCULAR | Status: DC
  Filled 2020-06-06: qty 3

## 2020-06-06 MED ORDER — CILGAVIMAB (PART OF EVUSHELD) INJECTION
300.0000 mg | Freq: Once | INTRAMUSCULAR | Status: AC
Start: 2020-06-06 — End: 2020-06-06
  Administered 2020-06-06: 300 mg via INTRAMUSCULAR
  Filled 2020-06-06: qty 3

## 2020-06-06 MED ORDER — TIXAGEVIMAB (PART OF EVUSHELD) INJECTION
300.0000 mg | Freq: Once | INTRAMUSCULAR | Status: AC
  Administered 2020-06-06: 300 mg via INTRAMUSCULAR
  Filled 2020-06-06: qty 3

## 2020-06-06 NOTE — Progress Notes (Signed)
Evusheld handout given and reviewed with pt.

## 2020-06-16 ENCOUNTER — Telehealth: Payer: Self-pay

## 2020-06-16 NOTE — Telephone Encounter (Signed)
Patient will call back to schedule AWS appointment

## 2020-07-29 ENCOUNTER — Ambulatory Visit
Admission: RE | Admit: 2020-07-29 | Discharge: 2020-07-29 | Disposition: A | Discharge status: Home / Self Care | Payer: Medicare Other | Source: Ambulatory Visit | Attending: Internal Medicine | Admitting: Internal Medicine

## 2020-07-29 ENCOUNTER — Other Ambulatory Visit: Payer: Self-pay

## 2020-07-29 DIAGNOSIS — Z1231 Encounter for screening mammogram for malignant neoplasm of breast: Secondary | ICD-10-CM

## 2020-07-30 ENCOUNTER — Encounter: Payer: Self-pay | Admitting: *Deleted

## 2020-08-05 DIAGNOSIS — D2261 Melanocytic nevi of right upper limb, including shoulder: Secondary | ICD-10-CM | POA: Diagnosis not present

## 2020-08-05 DIAGNOSIS — L570 Actinic keratosis: Secondary | ICD-10-CM | POA: Diagnosis not present

## 2020-08-05 DIAGNOSIS — L821 Other seborrheic keratosis: Secondary | ICD-10-CM | POA: Diagnosis not present

## 2020-08-05 DIAGNOSIS — L650 Telogen effluvium: Secondary | ICD-10-CM | POA: Diagnosis not present

## 2020-08-13 ENCOUNTER — Encounter: Payer: Self-pay | Admitting: Hematology

## 2020-08-21 ENCOUNTER — Other Ambulatory Visit: Payer: Self-pay | Admitting: *Deleted

## 2020-08-21 DIAGNOSIS — C8318 Mantle cell lymphoma, lymph nodes of multiple sites: Secondary | ICD-10-CM

## 2020-08-21 NOTE — Progress Notes (Signed)
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 The patient's last visit with [place] was on 05/20/2020. The pt reports that she is doing well overall.  The pt reports that she has been doing much better since stopping the induction treatment. She is gaining weight, feeling very well, and eating well. She continues to supplement with the Boost or ensures. She stays busy around her house and in her yard. She has been much improved over the last three months. She received the Evusheld as well.  The patient notes she is still dealing with much emotional stress and sadness regarding her husband since she is feeling better herself now. She is dealing with this through talking to people in her church and pastor.  Lab results today 08/22/2020 of CBC w/diff and CMP is as follows: all values are WNL except for RBC of 3.48, Hgb of 11.5, HCT of 33.4, Lymphs Abs of 0.4K. 08/22/2020 [facility] . Lab Results  Component Value Date   LDH 224 (H) 08/22/2020   On review of systems, pt reports weight gain and denies fevers, chills, infection issues, weight loss, decreased appetite, hair loss, fatigue, leg swelling, abdominal pain, and any other symptoms.   REVIEW OF SYSTEMS:   10 Point review of Systems was done is negative except as noted above.  Past Medical History:  Diagnosis Date   Allergy    Arthritis    Back pain    Complication of anesthesia    bp  dropped yrs ago, recent surgeries went ok   Dysrhythmia    occ, no meds for   Gastritis    GERD (gastroesophageal reflux disease)    H. pylori infection    Headache    migraines occ   Hiatal hernia    Hx of adenomatous colonic polyps 11/30/2017   Hyperlipidemia    Hypothyroidism 1970's   hx of years ago, no meds for    mantle cell lymphoma    Pulmonary nodule    Shortness of breath dyspnea    with exertion, pt told comes from acid reflux    . Past Surgical History:  Procedure Laterality Date   APPENDECTOMY     COLONOSCOPY  12/07/2013   DILATION AND CURETTAGE OF UTERUS     LAPAROSCOPIC CHOLECYSTECTOMY SINGLE SITE WITH INTRAOPERATIVE CHOLANGIOGRAM N/A 10/28/2015   Procedure: LAPAROSCOPIC CHOLECYSTECTOMY WITH INTRAOPERATIVE CHOLANGIOGRAM;  Surgeon: Armandina Gemma, MD;  Location: [facility];  Service: General;  Laterality: N/A;   LAPAROSCOPY  yrs ago   x 2   TMJ ARTHROSCOPY     TOTAL ABDOMINAL HYSTERECTOMY     complete    . Social History   Tobacco Use   Smoking status: Never   Smokeless tobacco: Never  Vaping Use   Vaping Use: Never used  Substance Use Topics   Alcohol use: Yes    Alcohol/week: 4.0 - 5.0 standard drinks    Types: 4 -  5 Cans of beer per week    Comment: 4-5 beers weekly-occ per pt   Drug use: No    ALLERGIES:  has No Known Allergies.  MEDICATIONS:  Current Outpatient Medications  Medication Sig Dispense Refill   acetaminophen (TYLENOL) 500 MG tablet Take 1,000 mg by mouth every 6 (six) hours as needed for moderate pain.     Cholecalciferol (VITAMIN D3) 5000 units CAPS Take 1 capsule by mouth daily.     fluticasone (FLONASE) 50 MCG/ACT nasal spray Place 1 spray into both nostrils daily as needed for allergies.     HYDROcodone-acetaminophen (NORCO/VICODIN) 5-325 MG tablet Take 1-2 tablets by mouth every 4 (four) hours as needed for moderate pain. 20 tablet 0   omeprazole (PRILOSEC) 20 MG capsule Take 20 mg by mouth every morning.     rosuvastatin  (CRESTOR) 5 MG tablet Take 5 mg by mouth daily.     vitamin B-12 (CYANOCOBALAMIN) 500 MCG tablet Take 500 mcg by mouth daily.     cyclobenzaprine (FLEXERIL) 10 MG tablet Take 10 mg by mouth daily as needed for muscle spasms. (Patient not taking: Reported on 08/22/2020)     dexamethasone (DECADRON) 4 MG tablet Take 2 tablets (8 mg total) by mouth daily. Start 3 days before and continue 2 days after bendamustine/Rituxan chemotherapy for 2 days. Take with food. (Patient not taking: Reported on 08/22/2020) 30 tablet 1   diclofenac Sodium (VOLTAREN) 1 % GEL Apply 1 application topically 4 (four) times daily. (Patient not taking: Reported on 08/22/2020)     levofloxacin (LEVAQUIN) 750 MG tablet TAKE 1 TABLET BY MOUTH ONCE A DAY FOR 10 DAYS (Patient not taking: Reported on 08/22/2020) 10 tablet 0   nystatin (MYCOSTATIN) 100000 UNIT/ML suspension TAKE 5MLS BY MOUTH FOUR TIMES DAILY (Patient not taking: Reported on 08/22/2020) 180 mL 0   ondansetron (ZOFRAN) 8 MG tablet Take 1 tablet (8 mg total) by mouth 2 (two) times daily as needed for refractory nausea / vomiting. Start on day 2 after bendamustine chemo. (Patient not taking: Reported on 08/22/2020) 30 tablet 1   potassium chloride SA (KLOR-CON) 20 MEQ tablet TAKE 1 TABLET BY MOUTH ONCE A DAY (Patient not taking: Reported on 08/22/2020) 30 tablet 1   No current facility-administered medications for this visit.    PHYSICAL EXAMINATION:  NAD. GENERAL:alert, in no acute distress and comfortable SKIN: no acute rashes, no significant lesions EYES: conjunctiva are pink and non-injected, sclera anicteric OROPHARYNX: MMM, no exudates, no oropharyngeal erythema or ulceration NECK: supple, no JVD LYMPH:  no palpable lymphadenopathy in the cervical, axillary or inguinal regions LUNGS: clear to auscultation b/l with normal respiratory effort HEART: regular rate & rhythm ABDOMEN:  normoactive bowel sounds , non tender, not distended. Extremity: no pedal edema PSYCH:  alert & oriented x 3 with fluent speech NEURO: no focal motor/sensory deficits   LABORATORY DATA:   I have reviewed the data as listed   CBC Latest Ref Rng & Units 08/22/2020 05/20/2020 04/28/2020  WBC 4.0 - 10.5 K/uL 5.7 4.7 4.0  Hemoglobin 12.0 - 15.0 g/dL 11.5(L) 10.7(L) 10.2(L)  Hematocrit 36.0 - 46.0 % 33.4(L) 32.0(L) 29.8(L)  Platelets 150 - 400 K/uL 209 174 196.0    . CMP Latest Ref Rng & Units 08/22/2020 05/20/2020 03/11/2020  Glucose 70 - 99 mg/dL 85 94 100(H)  BUN 8 - 23 mg/dL 18 11 11   Creatinine 0.44 - 1.00 mg/dL 0.87 0.91 0.97  Sodium 135 - 145 mmol/L 135 143 139  Potassium 3.5 -  5.1 mmol/L 4.1 4.7 4.3  Chloride 98 - 111 mmol/L 102 106 106  CO2 22 - 32 mmol/L 24 26 27   Calcium 8.9 - 10.3 mg/dL 10.1 9.6 9.4  Total Protein 6.5 - 8.1 g/dL 6.9 6.7 5.9(L)  Total Bilirubin 0.3 - 1.2 mg/dL 0.6 0.5 0.4  Alkaline Phos 38 - 126 U/L 73 62 54  AST 15 - 41 U/L 23 23 16   ALT 0 - 44 U/L 14 11 9    . Lab Results  Component Value Date   LDH 240 (H) 05/20/2020   . Lab Results  Component Value Date   LDH 224 (H) 08/22/2020     RADIOGRAPHIC STUDIES: I have personally reviewed the radiological images as listed and agreed with the findings in the report. MM DIGITAL SCREENING BILATERAL  Result Date: 07/30/2020 CLINICAL DATA:  Screening. EXAM: DIGITAL SCREENING BILATERAL MAMMOGRAM WITH CAD TECHNIQUE: Bilateral screening digital craniocaudal and mediolateral oblique mammograms were obtained. The images were evaluated with computer-aided detection. COMPARISON:  Previous exam(s). ACR Breast Density Category d: The breast tissue is extremely dense, which lowers the sensitivity of mammography FINDINGS: There are no findings suspicious for malignancy. IMPRESSION: No mammographic evidence of malignancy. A result letter of this screening mammogram will be mailed directly to the patient. RECOMMENDATION: Screening mammogram in one year. (Code:[id number]-01Y) BI-RADS CATEGORY  1: Negative. Electronically  Signed   By: Abelardo Diesel M.D.   On: 07/30/2020 13:08     ASSESSMENT & PLAN:   75 yo with   1) Stage III/IV Mantle cell lymphoma 01/11/2020 PET/CT ([id number]) revealed no visible disease, continued resolution. 2) s/p Allergic reaction to Rapid Rituxan. - rash and low grade fever -  resolved with solumedrol/tylenol, benadryl and famotidine   PLAN: -Discussed pt labwork today, 08/22/2020; counts stable and improved. Chemistries completely normal. -Advised pt that the maintenance standard regiment is every two months for three years. Possibility of every three months after one full year. -Advised pt that she is currently in remission of her Mantle Cell Lymphoma at this time. -There are no lab or clinical indication of Mantle Cell Lymphoma progression at this time.  -Recommended pt stay up to date with all vaccines (Prevnar, Pneumovax, Shingrix, second booster). -Will start maintenance Rituxan today. -Continue Vitamin B-complex daily. -Continue 2000 IU Vitamin D daily. -Will see back in 2 months with labs.   FOLLOW UP: Plz schedule next maintenance Rituxan , labs and MD visit in 2 months    The total time spent in the appointment was 30 minutes and more than 50% was on counseling and direct patient cares, ordering and mx of Rituxan immunotherapy  All of the patient's questions were answered with apparent satisfaction. The patient knows to call the clinic with any problems, questions or concerns.   Sullivan Lone MD [place] AAHIVMS [facility] Hematology/Oncology Physician [facility]  (Office):       [phone] (Work cell):  [phone] (Fax):           [phone]  I, Reinaldo Raddle, am acting as scribe for Dr. Sullivan Lone, MD.     .I have reviewed the above documentation for accuracy and completeness, and I agree with the above. Brunetta Genera MD

## 2020-08-22 ENCOUNTER — Inpatient Hospital Stay (HOSPITAL_BASED_OUTPATIENT_CLINIC_OR_DEPARTMENT_OTHER): Payer: Medicare Other | Admitting: Hematology

## 2020-08-22 ENCOUNTER — Inpatient Hospital Stay: Payer: Medicare Other | Attending: Hematology

## 2020-08-22 ENCOUNTER — Inpatient Hospital Stay: Payer: Medicare Other

## 2020-08-22 ENCOUNTER — Other Ambulatory Visit: Payer: Self-pay

## 2020-08-22 VITALS — BP 131/75 | HR 78 | Temp 99.0°F | Resp 18 | Ht 63.0 in | Wt 121.5 lb

## 2020-08-22 VITALS — BP 139/82 | HR 85 | Temp 98.6°F | Resp 17

## 2020-08-22 DIAGNOSIS — C8318 Mantle cell lymphoma, lymph nodes of multiple sites: Secondary | ICD-10-CM

## 2020-08-22 DIAGNOSIS — C8594 Non-Hodgkin lymphoma, unspecified, lymph nodes of axilla and upper limb: Secondary | ICD-10-CM | POA: Insufficient documentation

## 2020-08-22 DIAGNOSIS — D702 Other drug-induced agranulocytosis: Secondary | ICD-10-CM

## 2020-08-22 DIAGNOSIS — Z23 Encounter for immunization: Secondary | ICD-10-CM

## 2020-08-22 DIAGNOSIS — Z7189 Other specified counseling: Secondary | ICD-10-CM

## 2020-08-22 DIAGNOSIS — Z5112 Encounter for antineoplastic immunotherapy: Secondary | ICD-10-CM | POA: Diagnosis not present

## 2020-08-22 LAB — CBC WITH DIFFERENTIAL (CANCER CENTER ONLY)
Abs Immature Granulocytes: 0.17 10*3/uL — ABNORMAL HIGH (ref 0.00–0.07)
Basophils Absolute: 0 10*3/uL (ref 0.0–0.1)
Basophils Relative: 0 %
Eosinophils Absolute: 0 10*3/uL (ref 0.0–0.5)
Eosinophils Relative: 1 %
HCT: 33.4 % — ABNORMAL LOW (ref 36.0–46.0)
Hemoglobin: 11.5 g/dL — ABNORMAL LOW (ref 12.0–15.0)
Immature Granulocytes: 3 %
Lymphocytes Relative: 7 %
Lymphs Abs: 0.4 10*3/uL — ABNORMAL LOW (ref 0.7–4.0)
MCH: 33 pg (ref 26.0–34.0)
MCHC: 34.4 g/dL (ref 30.0–36.0)
MCV: 96 fL (ref 80.0–100.0)
Monocytes Absolute: 0.9 10*3/uL (ref 0.1–1.0)
Monocytes Relative: 16 %
Neutro Abs: 4.2 10*3/uL (ref 1.7–7.7)
Neutrophils Relative %: 73 %
Platelet Count: 209 10*3/uL (ref 150–400)
RBC: 3.48 MIL/uL — ABNORMAL LOW (ref 3.87–5.11)
RDW: 12.7 % (ref 11.5–15.5)
WBC Count: 5.7 10*3/uL (ref 4.0–10.5)
nRBC: 0 % (ref 0.0–0.2)

## 2020-08-22 LAB — CMP (CANCER CENTER ONLY)
ALT: 14 U/L (ref 0–44)
AST: 23 U/L (ref 15–41)
Albumin: 4.3 g/dL (ref 3.5–5.0)
Alkaline Phosphatase: 73 U/L (ref 38–126)
Anion gap: 9 (ref 5–15)
BUN: 18 mg/dL (ref 8–23)
CO2: 24 mmol/L (ref 22–32)
Calcium: 10.1 mg/dL (ref 8.9–10.3)
Chloride: 102 mmol/L (ref 98–111)
Creatinine: 0.87 mg/dL (ref 0.44–1.00)
GFR, Estimated: >60 mL/min (ref >60)
Glucose, Bld: 85 mg/dL (ref 70–99)
Potassium: 4.1 mmol/L (ref 3.5–5.1)
Sodium: 135 mmol/L (ref 135–145)
Total Bilirubin: 0.6 mg/dL (ref 0.3–1.2)
Total Protein: 6.9 g/dL (ref 6.5–8.1)

## 2020-08-22 LAB — LACTATE DEHYDROGENASE: LDH: 224 U/L — ABNORMAL HIGH (ref 98–192)

## 2020-08-22 MED ORDER — DIPHENHYDRAMINE HCL 25 MG PO CAPS
50.0000 mg | ORAL_CAPSULE | Freq: Once | ORAL | Status: AC
  Administered 2020-08-22: 50 mg via ORAL

## 2020-08-22 MED ORDER — SODIUM CHLORIDE 0.9 % IV SOLN
375.0000 mg/m2 | Freq: Once | INTRAVENOUS | Status: AC
  Administered 2020-08-22: 600 mg via INTRAVENOUS
  Filled 2020-08-22: qty 50

## 2020-08-22 MED ORDER — MONTELUKAST SODIUM 10 MG PO TABS
ORAL_TABLET | ORAL | Status: AC
  Filled 2020-08-22: qty 1

## 2020-08-22 MED ORDER — SODIUM CHLORIDE 0.9 % IV SOLN
10.0000 mg | Freq: Once | INTRAVENOUS | Status: AC
  Administered 2020-08-22: 10 mg via INTRAVENOUS
  Filled 2020-08-22: qty 10

## 2020-08-22 MED ORDER — SODIUM CHLORIDE 0.9 % IV SOLN
Freq: Once | INTRAVENOUS | Status: DC
  Filled 2020-08-22: qty 250

## 2020-08-22 MED ORDER — FAMOTIDINE 20 MG IN NS 100 ML IVPB
INTRAVENOUS | Status: AC
  Filled 2020-08-22: qty 100

## 2020-08-22 MED ORDER — ACETAMINOPHEN 325 MG PO TABS
650.0000 mg | ORAL_TABLET | Freq: Once | ORAL | Status: AC
  Administered 2020-08-22: 650 mg via ORAL

## 2020-08-22 MED ORDER — MONTELUKAST SODIUM 10 MG PO TABS
10.0000 mg | ORAL_TABLET | Freq: Once | ORAL | Status: AC
  Administered 2020-08-22: 10 mg via ORAL

## 2020-08-22 MED ORDER — DIPHENHYDRAMINE HCL 25 MG PO CAPS
ORAL_CAPSULE | ORAL | Status: AC
  Filled 2020-08-22: qty 2

## 2020-08-22 MED ORDER — ACETAMINOPHEN 325 MG PO TABS
ORAL_TABLET | ORAL | Status: AC
  Filled 2020-08-22: qty 2

## 2020-08-22 MED ORDER — FAMOTIDINE 20 MG IN NS 100 ML IVPB
20.0000 mg | Freq: Once | INTRAVENOUS | Status: AC
  Administered 2020-08-22: 20 mg via INTRAVENOUS

## 2020-08-22 NOTE — Patient Instructions (Signed)
[facility] Discharge Instructions for Patients Receiving Chemotherapy  Today you received the following chemotherapy agents: rituximab.  To help prevent nausea and vomiting after your treatment, we encourage you to take your nausea medication as directed.   If you develop nausea and vomiting that is not controlled by your nausea medication, call the clinic.   BELOW ARE SYMPTOMS THAT SHOULD BE REPORTED IMMEDIATELY:  *FEVER GREATER THAN 100.5 F  *CHILLS WITH OR WITHOUT FEVER  NAUSEA AND VOMITING THAT IS NOT CONTROLLED WITH YOUR NAUSEA MEDICATION  *UNUSUAL SHORTNESS OF BREATH  *UNUSUAL BRUISING OR BLEEDING  TENDERNESS IN MOUTH AND THROAT WITH OR WITHOUT PRESENCE OF ULCERS  *URINARY PROBLEMS  *BOWEL PROBLEMS  UNUSUAL RASH Items with * indicate a potential emergency and should be followed up as soon as possible.  Feel free to call the clinic should you have any questions or concerns. The clinic phone number is [phone].  Please show the CHEMO ALERT CARD at check-in to the Emergency Department and triage nurse.   

## 2020-08-28 ENCOUNTER — Encounter: Payer: Self-pay | Admitting: Hematology

## 2020-09-01 ENCOUNTER — Encounter: Payer: Self-pay | Admitting: Internal Medicine

## 2020-09-01 ENCOUNTER — Ambulatory Visit (INDEPENDENT_AMBULATORY_CARE_PROVIDER_SITE_OTHER): Payer: Medicare Other | Admitting: Internal Medicine

## 2020-09-01 VITALS — BP 122/64 | HR 64 | Ht 63.0 in | Wt 121.0 lb

## 2020-09-01 DIAGNOSIS — R933 Abnormal findings on diagnostic imaging of other parts of digestive tract: Secondary | ICD-10-CM | POA: Diagnosis not present

## 2020-09-01 DIAGNOSIS — K219 Gastro-esophageal reflux disease without esophagitis: Secondary | ICD-10-CM | POA: Diagnosis not present

## 2020-09-01 DIAGNOSIS — Z86010 Personal history of colonic polyps: Secondary | ICD-10-CM

## 2020-09-01 NOTE — Progress Notes (Signed)
Subjective:    Patient ID: Brooke [name], female    DOB: 07-Feb-1946, 75 y.o.   MRN: [id number]  HPI Brooke [name] is a 75 year old female with a past medical history of adenomatous colon polyps, family history of colon cancer, history of GERD, stage III/IV Mantle cell lymphoma treated with chemotherapy under the direction of Dr. Irene Limbo who is seen to evaluate possible thickening in the right colon seen by CT scan.  She is here today with her daughter.  She was diagnosed with mantle cell lymphoma in August 2021 treated with chemotherapy and is now felt to be in remission.  She had a PET scan from the skull base to mid thigh on 05/15/2020.  This showed no evidence of active mantle cell lymphoma.  There was questionable surrounding stranding in the right hemicolon and segmental uptake.  This area was under distended.  Radiology recommended correlating with any signs of colitis.  Patient reports that she is feeling well.  She had lost weight when she was diagnosed and treated for her mantle cell lymphoma.  She lost down to a low weight of 109 pounds and is now gained back to 121 pounds.  Her energy levels are returning to normal as is her appetite.  She denies nausea and vomiting.  She is having 1 to sometimes 2 bowel movements per day.  They can be urgent at times but for the most part are mostly formed.  No diarrhea.  No blood in stool or melena.  No nausea or vomiting.  She rarely has heartburn and she continues the omeprazole 20 mg daily.  Her last colonoscopy was performed on 11/14/2017 by Dr. Carlean Purl.  The bowel preparation was excellent and the exam complete.  3 polyps were removed the largest being 4 mm.  2 of these polyps were adenomatous the other was benign colonic mucosa.   Review of Systems As per HPI, otherwise negative  Current Medications, Allergies, Past Medical History, Past Surgical History, Family History and Social History were reviewed in [employer] record.    Objective:   Physical Exam BP 122/64   Pulse 64   Ht '5\' 3"'$  (1.6 m)   Wt 121 lb (54.9 kg)   BMI 21.43 kg/m  Gen: awake, alert, NAD HEENT: anicteric CV: RRR, no mrg Pulm: CTA b/l Abd: soft, NT/ND, +BS throughout Ext: no c/c/e Neuro: nonfocal  NUCLEAR MEDICINE PET SKULL BASE TO THIGH   TECHNIQUE: 5.9 mCi F-18 FDG was injected intravenously. Full-ring PET imaging was performed from the skull base to thigh after the radiotracer. CT data was obtained and used for attenuation correction and anatomic localization.   Fasting blood glucose: 100 mg/dl   COMPARISON:  Multiple prior studies most recent of January 11, 2020   FINDINGS: Mediastinal blood pool activity: SUV max 1.79   Liver activity: SUV max 3.48   NECK: No hypermetabolic lymph nodes in the neck.   Incidental CT findings: none   CHEST: No hypermetabolic mediastinal or hilar nodes. No suspicious pulmonary nodules on the CT scan. Tiny 4 mm nodule in the LEFT upper lobe stable since August of 2021. No hypermetabolic nodule in the chest.   Incidental CT findings: Calcified coronary artery disease, three-vessel disease. Mitral annular calcifications. Normal heart size without pericardial effusion. Normal caliber thoracic aorta. Normal caliber central pulmonary vasculature. Limited assessment of cardiovascular structures given lack of intravenous contrast. Esophagus grossly normal.   Mild atelectasis.   ABDOMEN/PELVIS: No abnormal hypermetabolic activity within the liver,  pancreas, adrenal glands, or spleen. No hypermetabolic lymph nodes in the abdomen or pelvis.   Incidental CT findings: Small presumed hepatic cyst LEFT hepatic lobe unchanged. Post cholecystectomy. Pancreas, spleen, adrenal glands and kidneys without acute process. No acute no definite acute gastrointestinal findings. RIGHT colon is collapsed. Mildly indistinct fat about the RIGHT colon and segmental increased FDG uptake  greater than that of surrounding bowel.   Trace free fluid in the pelvis following hysterectomy, nonspecific and while unusual in a patient of this age only minimally increased compared to prior imaging calcified atheromatous plaque of the abdominal aorta without aneurysmal dilation. There is no gastrohepatic or hepatoduodenal ligament lymphadenopathy. No retroperitoneal or mesenteric lymphadenopathy.   Groin lymph nodes seen on previous imaging have resolved.   SKELETON: No focal hypermetabolic activity to suggest skeletal metastasis.   Incidental CT findings: Spinal degenerative changes. Glenohumeral degenerative changes on the LEFT.   IMPRESSION: No FDG avid disease in the neck, chest, abdomen or pelvis.   Segmental uptake in the RIGHT hemicolon with colonic under distension is nonspecific, associated with questionable surrounding stranding. Correlate with any signs of colitis.   Small free fluid in the pelvis, unusual in a patient of this age but only minimally increased when compared to the previous exam.   Tiny 4 mm LEFT upper lobe pulmonary nodule.  Attention on follow-up.   Calcified coronary artery disease and aortic atherosclerosis.   Post cholecystectomy and hysterectomy.   These results will be called to the ordering clinician or representative by the Radiologist Assistant, and communication documented in the PACS or [employer].   Aortic Atherosclerosis (ICD10-I70.0).     Electronically Signed   By: Zetta Bills M.D.   On: 05/16/2020 15:23       Assessment & Plan:  75 year old female with a past medical history of adenomatous colon polyps, family history of colon cancer, history of GERD, stage III/IV Mantle cell lymphoma treated with chemotherapy under the direction of Dr. Irene Limbo who is seen to evaluate possible thickening in the right colon seen by CT scan.   Questionable colonic thickening by PET scan --she is not having any clinical symptoms of  colitis and in fact is feeling well.  Bowel movements have not significantly changed and there is been no evidence of blood in stool or melena.  She has been able to gain weight since being treated for mantle cell lymphoma.  The PET scan findings are very nonspecific and likely related to underdistention.  Her last colonoscopy was less than 2 years ago and there was no evidence for colitis at that time.  I recommended observation only and not colonoscopy for now.  She will be getting further imaging for surveillance per Dr. Irene Limbo.  I asked that she let me know if she develops change in bowel habits, diarrhea, abdominal pain or blood in stool.  She is happy with this plan. --Observation and attention on neck surveillance imaging  2.  History of colon polyps and family history of colon cancer --surveillance colonoscopy recommended October 2024  3.  GERD --currently well controlled and continue omeprazole 20 mg daily  30 minutes total spent today including patient facing time, coordination of care, reviewing medical history/procedures/pertinent radiology studies, and documentation of the encounter.

## 2020-09-01 NOTE — Patient Instructions (Signed)
If you are age 75 or older, your body mass index should be between 23-30. Your Body mass index is 21.43 kg/m. If this is out of the aforementioned range listed, please consider follow up with your Primary Care Provider. __________________________________________________________  The [facility] providers would like to encourage you to use [facility] to communicate with providers for non-urgent requests or questions.  Due to long hold times on the telephone, sending your provider a message by [facility] may be a faster and more efficient way to get a response.  Please allow 48 business hours for a response.  Please remember that this is for non-urgent requests.    If you develop the following symptoms please contact the office: Colitis like symptoms, abdominal pain, blood in stools/rectal bleeding.  Follow up as needed for now.   Due to recent changes in healthcare laws, you may see the results of your imaging and laboratory studies on MyChart before your provider has had a chance to review them.  We understand that in some cases there may be results that are confusing or concerning to you. Not all laboratory results come back in the same time frame and the provider may be waiting for multiple results in order to interpret others.  Please give [place] 48 hours in order for your provider to thoroughly review all the results before contacting the office for clarification of your results.

## 2020-10-23 ENCOUNTER — Other Ambulatory Visit: Payer: Self-pay

## 2020-10-23 DIAGNOSIS — C8318 Mantle cell lymphoma, lymph nodes of multiple sites: Secondary | ICD-10-CM

## 2020-10-24 ENCOUNTER — Inpatient Hospital Stay: Payer: Medicare Other

## 2020-10-24 ENCOUNTER — Other Ambulatory Visit: Payer: Self-pay

## 2020-10-24 ENCOUNTER — Inpatient Hospital Stay: Payer: Medicare Other | Attending: Hematology | Admitting: Hematology

## 2020-10-24 VITALS — BP 129/84 | HR 80 | Temp 98.8°F | Resp 16

## 2020-10-24 VITALS — BP 126/77 | HR 89 | Temp 98.3°F | Resp 16 | Ht 63.0 in | Wt 125.4 lb

## 2020-10-24 DIAGNOSIS — C8318 Mantle cell lymphoma, lymph nodes of multiple sites: Secondary | ICD-10-CM

## 2020-10-24 DIAGNOSIS — E7800 Pure hypercholesterolemia, unspecified: Secondary | ICD-10-CM | POA: Insufficient documentation

## 2020-10-24 DIAGNOSIS — Z5112 Encounter for antineoplastic immunotherapy: Secondary | ICD-10-CM | POA: Insufficient documentation

## 2020-10-24 DIAGNOSIS — Z5111 Encounter for antineoplastic chemotherapy: Secondary | ICD-10-CM

## 2020-10-24 DIAGNOSIS — D702 Other drug-induced agranulocytosis: Secondary | ICD-10-CM

## 2020-10-24 DIAGNOSIS — Z7189 Other specified counseling: Secondary | ICD-10-CM

## 2020-10-24 DIAGNOSIS — L299 Pruritus, unspecified: Secondary | ICD-10-CM | POA: Insufficient documentation

## 2020-10-24 LAB — CBC WITH DIFFERENTIAL (CANCER CENTER ONLY)
Abs Immature Granulocytes: 0.1 10*3/uL — ABNORMAL HIGH (ref 0.00–0.07)
Basophils Absolute: 0 10*3/uL (ref 0.0–0.1)
Basophils Relative: 1 %
Eosinophils Absolute: 0.1 10*3/uL (ref 0.0–0.5)
Eosinophils Relative: 1 %
HCT: 32.4 % — ABNORMAL LOW (ref 36.0–46.0)
Hemoglobin: 10.9 g/dL — ABNORMAL LOW (ref 12.0–15.0)
Immature Granulocytes: 2 %
Lymphocytes Relative: 6 %
Lymphs Abs: 0.4 10*3/uL — ABNORMAL LOW (ref 0.7–4.0)
MCH: 32.7 pg (ref 26.0–34.0)
MCHC: 33.6 g/dL (ref 30.0–36.0)
MCV: 97.3 fL (ref 80.0–100.0)
Monocytes Absolute: 1.1 10*3/uL — ABNORMAL HIGH (ref 0.1–1.0)
Monocytes Relative: 16 %
Neutro Abs: 4.9 10*3/uL (ref 1.7–7.7)
Neutrophils Relative %: 74 %
Platelet Count: 190 10*3/uL (ref 150–400)
RBC: 3.33 MIL/uL — ABNORMAL LOW (ref 3.87–5.11)
RDW: 13 % (ref 11.5–15.5)
WBC Count: 6.5 10*3/uL (ref 4.0–10.5)
nRBC: 0 % (ref 0.0–0.2)

## 2020-10-24 LAB — CMP (CANCER CENTER ONLY)
ALT: 16 U/L (ref 0–44)
AST: 24 U/L (ref 15–41)
Albumin: 4.3 g/dL (ref 3.5–5.0)
Alkaline Phosphatase: 73 U/L (ref 38–126)
Anion gap: 10 (ref 5–15)
BUN: 26 mg/dL — ABNORMAL HIGH (ref 8–23)
CO2: 21 mmol/L — ABNORMAL LOW (ref 22–32)
Calcium: 9.9 mg/dL (ref 8.9–10.3)
Chloride: 106 mmol/L (ref 98–111)
Creatinine: 0.88 mg/dL (ref 0.44–1.00)
GFR, Estimated: >60 mL/min (ref >60)
Glucose, Bld: 99 mg/dL (ref 70–99)
Potassium: 4 mmol/L (ref 3.5–5.1)
Sodium: 137 mmol/L (ref 135–145)
Total Bilirubin: 0.5 mg/dL (ref 0.3–1.2)
Total Protein: 6.7 g/dL (ref 6.5–8.1)

## 2020-10-24 LAB — LACTATE DEHYDROGENASE: LDH: 258 U/L — ABNORMAL HIGH (ref 98–192)

## 2020-10-24 MED ORDER — SODIUM CHLORIDE 0.9 % IV SOLN
10.0000 mg | Freq: Once | INTRAVENOUS | Status: AC
  Administered 2020-10-24: 10 mg via INTRAVENOUS
  Filled 2020-10-24: qty 10

## 2020-10-24 MED ORDER — SODIUM CHLORIDE 0.9 % IV SOLN
375.0000 mg/m2 | Freq: Once | INTRAVENOUS | Status: AC
  Administered 2020-10-24: 600 mg via INTRAVENOUS
  Filled 2020-10-24: qty 50

## 2020-10-24 MED ORDER — ACETAMINOPHEN 325 MG PO TABS
650.0000 mg | ORAL_TABLET | Freq: Once | ORAL | Status: AC
  Administered 2020-10-24: 650 mg via ORAL

## 2020-10-24 MED ORDER — MONTELUKAST SODIUM 10 MG PO TABS
10.0000 mg | ORAL_TABLET | Freq: Once | ORAL | Status: AC
  Administered 2020-10-24: 10 mg via ORAL

## 2020-10-24 MED ORDER — DIPHENHYDRAMINE HCL 25 MG PO CAPS
50.0000 mg | ORAL_CAPSULE | Freq: Once | ORAL | Status: AC
  Administered 2020-10-24: 50 mg via ORAL

## 2020-10-24 MED ORDER — FAMOTIDINE 20 MG IN NS 100 ML IVPB
20.0000 mg | Freq: Once | INTRAVENOUS | Status: AC
  Administered 2020-10-24: 20 mg via INTRAVENOUS

## 2020-10-24 MED ORDER — SODIUM CHLORIDE 0.9 % IV SOLN: Freq: Once | INTRAVENOUS | Status: AC

## 2020-10-24 NOTE — Patient Instructions (Signed)
[facility] MEDICAL [facility]COLOGY  Discharge Instructions: Thank you for choosing [facility] to provide your oncology and hematology care.   If you have a lab appointment with the Cancer Center, please go directly to the Cancer Center and check in at the registration area.   Wear comfortable clothing and clothing appropriate for easy access to any Portacath or PICC line.   We strive to give you quality time with your provider. You may need to reschedule your appointment if you arrive late (15 or more minutes).  Arriving late affects you and other patients whose appointments are after yours.  Also, if you miss three or more appointments without notifying the office, you may be dismissed from the clinic at the provider's discretion.      For prescription refill requests, have your pharmacy contact our office and allow 72 hours for refills to be completed.    Today you received the following chemotherapy and/or immunotherapy agents rituxan      To help prevent nausea and vomiting after your treatment, we encourage you to take your nausea medication as directed.  BELOW ARE SYMPTOMS THAT SHOULD BE REPORTED IMMEDIATELY: *FEVER GREATER THAN 100.4 F (38 C) OR HIGHER *CHILLS OR SWEATING *NAUSEA AND VOMITING THAT IS NOT CONTROLLED WITH YOUR NAUSEA MEDICATION *UNUSUAL SHORTNESS OF BREATH *UNUSUAL BRUISING OR BLEEDING *URINARY PROBLEMS (pain or burning when urinating, or frequent urination) *BOWEL PROBLEMS (unusual diarrhea, constipation, pain near the anus) TENDERNESS IN MOUTH AND THROAT WITH OR WITHOUT PRESENCE OF ULCERS (sore throat, sores in mouth, or a toothache) UNUSUAL RASH, SWELLING OR PAIN  UNUSUAL VAGINAL DISCHARGE OR ITCHING   Items with * indicate a potential emergency and should be followed up as soon as possible or go to the Emergency Department if any problems should occur.  Please show the CHEMOTHERAPY ALERT CARD or IMMUNOTHERAPY ALERT CARD at check-in to the  Emergency Department and triage nurse.  Should you have questions after your visit or need to cancel or reschedule your appointment, please contact [facility]  Dept: [phone]  and follow the prompts.  Office hours are 8:00 a.m. to 4:30 p.m. Monday - Friday. Please note that voicemails left after 4:00 p.m. may not be returned until the following business day.  We are closed weekends and major holidays. You have access to a nurse at all times for urgent questions. Please call the main number to the clinic Dept: [phone] and follow the prompts.   For any non-urgent questions, you may also contact your provider using MyChart. We now offer e-Visits for anyone 18 and older to request care online for non-urgent symptoms. For details visit [email or web address].   Also download the MyChart app! Go to the app store, search "MyChart", open the app, select [facility], and log in with your MyChart username and password.  Due to Covid, a mask is required upon entering the hospital/clinic. If you do not have a mask, one will be given to you upon arrival. For doctor visits, patients may have 1 support person aged 18 or older with them. For treatment visits, patients cannot have anyone with them due to current Covid guidelines and our immunocompromised population.   

## 2020-10-30 ENCOUNTER — Encounter: Payer: Self-pay | Admitting: Hematology

## 2020-10-30 NOTE — Progress Notes (Signed)
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 is here for evaluation and continued management of Mantle Cell Lymphoma and next dose of maintenance Rituxan. The patient's last visit with [place] was on 08/22/2020. The pt reports that she is doing well overall. She noted a little bit of itching for 1 to 2 days after her last dose of Rituxan which resolved quickly with Benadryl.  She was recommended to take Claritin and Pepcid for 2 to 3 days after each dose of her Rituxan. She is not getting rapid Rituxan. Patient notes no new fatigue.  Is emotionally feeling a little better but notes that she still feels a little sad during the holidays from her husband's demise.  Patient notes no fevers chills night sweats new lumps or bumps. P.o. intake has been stable and he she has gained 4 pounds since her last clinic visit 2 months ago.  Lab results today 10/24/2020 of CBC w/diff mild anemia with a hemoglobin of 10.9 normal WBC count of 6.5k and normal platelets of 190k CMP unremarkable LDH stable at 258.   REVIEW OF SYSTEMS:   10 Point review of Systems was done is negative except as noted above.  Past Medical History:  Diagnosis Date   Allergy    Arthritis    Back pain    Complication of anesthesia    bp dropped yrs ago, recent surgeries went ok   Dysrhythmia    occ, no meds for   Gastritis    GERD (gastroesophageal reflux disease)    H. pylori infection    Headache    migraines occ   Hiatal hernia    Hx of  adenomatous colonic polyps 11/30/2017   Hyperlipidemia    Hypothyroidism 1970's   hx of years ago, no meds for    mantle cell lymphoma    Pulmonary nodule    Shortness of breath dyspnea    with exertion, pt told comes from acid reflux    . Past Surgical History:  Procedure Laterality Date   APPENDECTOMY     COLONOSCOPY  12/07/2013   DILATION AND CURETTAGE OF UTERUS     LAPAROSCOPIC CHOLECYSTECTOMY SINGLE SITE WITH INTRAOPERATIVE CHOLANGIOGRAM N/A 10/28/2015   Procedure: LAPAROSCOPIC CHOLECYSTECTOMY WITH INTRAOPERATIVE CHOLANGIOGRAM;  Surgeon: Armandina Gemma, MD;  Location: [facility];  Service: General;  Laterality: N/A;   LAPAROSCOPY  yrs ago   x 2   TMJ ARTHROSCOPY     TOTAL ABDOMINAL HYSTERECTOMY     complete    . Social History   Tobacco Use   Smoking status: Never   Smokeless tobacco: Never  Vaping Use   Vaping Use: Never used  Substance Use Topics   Alcohol use: Yes    Alcohol/week: 4.0 - 5.0 standard drinks    Types: 4 - 5 Cans of beer per week    Comment: 4-5 beers weekly-occ per pt   Drug use: No    ALLERGIES:  has No Known Allergies.  MEDICATIONS:  Current Outpatient  Medications  Medication Sig Dispense Refill   acetaminophen (TYLENOL) 500 MG tablet Take 1,000 mg by mouth every 6 (six) hours as needed for moderate pain.     Cholecalciferol (VITAMIN D3) 5000 units CAPS Take 1 capsule by mouth daily.     cyclobenzaprine (FLEXERIL) 10 MG tablet Take 10 mg by mouth daily as needed for muscle spasms.     fluticasone (FLONASE) 50 MCG/ACT nasal spray Place 1 spray into both nostrils daily as needed for allergies.     HYDROcodone-acetaminophen (NORCO/VICODIN) 5-325 MG tablet Take 1-2 tablets by mouth every 4 (four) hours as needed for moderate pain. 20 tablet 0   omeprazole (PRILOSEC) 20 MG capsule Take 20 mg by mouth every morning.     rosuvastatin (CRESTOR) 5 MG tablet Take 5 mg by mouth daily.     vitamin B-12 (CYANOCOBALAMIN) 500 MCG tablet Take 500 mcg by mouth  daily.     No current facility-administered medications for this visit.    PHYSICAL EXAMINATION:  NAD . GENERAL:alert, in no acute distress and comfortable SKIN: no acute rashes, no significant lesions EYES: conjunctiva are pink and non-injected, sclera anicteric OROPHARYNX: MMM, no exudates, no oropharyngeal erythema or ulceration NECK: supple, no JVD LYMPH:  no palpable lymphadenopathy in the cervical, axillary or inguinal regions LUNGS: clear to auscultation b/l with normal respiratory effort HEART: regular rate & rhythm ABDOMEN:  normoactive bowel sounds , non tender, not distended. Extremity: no pedal edema PSYCH: alert & oriented x 3 with fluent speech NEURO: no focal motor/sensory deficits    LABORATORY DATA:   I have reviewed the data as listed   CBC Latest Ref Rng & Units 10/24/2020 08/22/2020 05/20/2020  WBC 4.0 - 10.5 K/uL 6.5 5.7 4.7  Hemoglobin 12.0 - 15.0 g/dL 10.9(L) 11.5(L) 10.7(L)  Hematocrit 36.0 - 46.0 % 32.4(L) 33.4(L) 32.0(L)  Platelets 150 - 400 K/uL 190 209 174    . CMP Latest Ref Rng & Units 10/24/2020 08/22/2020 05/20/2020  Glucose 70 - 99 mg/dL 99 85 94  BUN 8 - 23 mg/dL 26(H) 18 11  Creatinine 0.44 - 1.00 mg/dL 0.88 0.87 0.91  Sodium 135 - 145 mmol/L 137 135 143  Potassium 3.5 - 5.1 mmol/L 4.0 4.1 4.7  Chloride 98 - 111 mmol/L 106 102 106  CO2 22 - 32 mmol/L 21(L) 24 26  Calcium 8.9 - 10.3 mg/dL 9.9 10.1 9.6  Total Protein 6.5 - 8.1 g/dL 6.7 6.9 6.7  Total Bilirubin 0.3 - 1.2 mg/dL 0.5 0.6 0.5  Alkaline Phos 38 - 126 U/L 73 73 62  AST 15 - 41 U/L 24 23 23   ALT 0 - 44 U/L 16 14 11    . Lab Results  Component Value Date   LDH 258 (H) 10/24/2020   RADIOGRAPHIC STUDIES: I have personally reviewed the radiological images as listed and agreed with the findings in the report. No results found.   ASSESSMENT & PLAN:   75 yo with   1) Stage III/IV Mantle cell lymphoma 01/11/2020 PET/CT ([id number]) revealed no visible disease, continued  resolution. 2) s/p Allergic reaction to Rapid Rituxan. - rash and low grade fever -  resolved with solumedrol/tylenol, benadryl and famotidine   PLAN: -Discussed pt labwork today, 10/24/2020; counts stable and improved. Chemistries unremarkable. -LDL slightly elevated with stable. -Patient has no overt lab or clinical evidence of mantle cell lymphoma progression at this time. -Recommended pt stay up to date with vaccination and recommended that unless previous history of reactions or allergies she  should get her annual flu shot and also consider bivalent COVID-19 vaccination if it has been 2 months since her last booster vaccine. -No regular toxicities from continuing maintenance Rituxan at this time. -Grade 1 itching recommended taking Claritin 10 mg p.o. daily and Pepcid 20 mg p.o. twice daily for 3 to 5 days after each dose of Rituxan.  Might use Benadryl as needed as well. -Continue Vitamin B-complex daily. -Continue 2000 IU Vitamin D daily. -Will see back in 2 months with labs.   FOLLOW UP: Plz schedule next 2 doses of maintenance Rituxan(every 60 days) with labs and MD visits.  . The total time spent in the appointment was 30 minutes and more than 50% was on counseling and direct patient care, toxicity assessment, ordering and management of Rituxan treatment.  All of the patient's questions were answered with apparent satisfaction. The patient knows to call the clinic with any problems, questions or concerns.   Sullivan Lone MD [place] AAHIVMS [facility] Hematology/Oncology Physician [facility]

## 2020-11-19 DIAGNOSIS — M545 Low back pain, unspecified: Secondary | ICD-10-CM | POA: Diagnosis not present

## 2020-11-19 DIAGNOSIS — C8310 Mantle cell lymphoma, unspecified site: Secondary | ICD-10-CM | POA: Diagnosis not present

## 2020-11-27 DIAGNOSIS — Z01419 Encounter for gynecological examination (general) (routine) without abnormal findings: Secondary | ICD-10-CM | POA: Diagnosis not present

## 2020-11-27 DIAGNOSIS — Z6822 Body mass index (BMI) 22.0-22.9, adult: Secondary | ICD-10-CM | POA: Diagnosis not present

## 2020-11-27 DIAGNOSIS — Z124 Encounter for screening for malignant neoplasm of cervix: Secondary | ICD-10-CM | POA: Diagnosis not present

## 2020-12-22 DIAGNOSIS — G8929 Other chronic pain: Secondary | ICD-10-CM | POA: Diagnosis not present

## 2020-12-22 DIAGNOSIS — C8590 Non-Hodgkin lymphoma, unspecified, unspecified site: Secondary | ICD-10-CM | POA: Diagnosis not present

## 2020-12-23 ENCOUNTER — Other Ambulatory Visit: Payer: Self-pay

## 2020-12-23 DIAGNOSIS — C8318 Mantle cell lymphoma, lymph nodes of multiple sites: Secondary | ICD-10-CM

## 2020-12-24 ENCOUNTER — Other Ambulatory Visit: Payer: Self-pay

## 2020-12-24 ENCOUNTER — Inpatient Hospital Stay: Payer: Medicare Other | Attending: Hematology

## 2020-12-24 ENCOUNTER — Inpatient Hospital Stay: Payer: Medicare Other

## 2020-12-24 ENCOUNTER — Inpatient Hospital Stay (HOSPITAL_BASED_OUTPATIENT_CLINIC_OR_DEPARTMENT_OTHER): Payer: Medicare Other | Admitting: Hematology

## 2020-12-24 VITALS — BP 134/88 | HR 84 | Temp 98.4°F | Resp 18

## 2020-12-24 VITALS — BP 142/84 | HR 81 | Temp 97.5°F | Resp 18 | Wt 127.5 lb

## 2020-12-24 DIAGNOSIS — C8318 Mantle cell lymphoma, lymph nodes of multiple sites: Secondary | ICD-10-CM

## 2020-12-24 DIAGNOSIS — D702 Other drug-induced agranulocytosis: Secondary | ICD-10-CM

## 2020-12-24 DIAGNOSIS — Z5112 Encounter for antineoplastic immunotherapy: Secondary | ICD-10-CM | POA: Insufficient documentation

## 2020-12-24 DIAGNOSIS — Z5111 Encounter for antineoplastic chemotherapy: Secondary | ICD-10-CM

## 2020-12-24 DIAGNOSIS — Z7189 Other specified counseling: Secondary | ICD-10-CM

## 2020-12-24 LAB — CMP (CANCER CENTER ONLY)
ALT: 15 U/L (ref 0–44)
AST: 22 U/L (ref 15–41)
Albumin: 4.2 g/dL (ref 3.5–5.0)
Alkaline Phosphatase: 64 U/L (ref 38–126)
Anion gap: 9 (ref 5–15)
BUN: 17 mg/dL (ref 8–23)
CO2: 26 mmol/L (ref 22–32)
Calcium: 9.4 mg/dL (ref 8.9–10.3)
Chloride: 107 mmol/L (ref 98–111)
Creatinine: 0.94 mg/dL (ref 0.44–1.00)
GFR, Estimated: >60 mL/min (ref >60)
Glucose, Bld: 79 mg/dL (ref 70–99)
Potassium: 3.8 mmol/L (ref 3.5–5.1)
Sodium: 142 mmol/L (ref 135–145)
Total Bilirubin: 0.5 mg/dL (ref 0.3–1.2)
Total Protein: 6.6 g/dL (ref 6.5–8.1)

## 2020-12-24 LAB — CBC WITH DIFFERENTIAL (CANCER CENTER ONLY)
Abs Immature Granulocytes: 0.04 10*3/uL (ref 0.00–0.07)
Basophils Absolute: 0 10*3/uL (ref 0.0–0.1)
Basophils Relative: 1 %
Eosinophils Absolute: 0.1 10*3/uL (ref 0.0–0.5)
Eosinophils Relative: 2 %
HCT: 31.6 % — ABNORMAL LOW (ref 36.0–46.0)
Hemoglobin: 10.9 g/dL — ABNORMAL LOW (ref 12.0–15.0)
Immature Granulocytes: 1 %
Lymphocytes Relative: 17 %
Lymphs Abs: 0.9 10*3/uL (ref 0.7–4.0)
MCH: 33.1 pg (ref 26.0–34.0)
MCHC: 34.5 g/dL (ref 30.0–36.0)
MCV: 96 fL (ref 80.0–100.0)
Monocytes Absolute: 0.8 10*3/uL (ref 0.1–1.0)
Monocytes Relative: 16 %
Neutro Abs: 3.2 10*3/uL (ref 1.7–7.7)
Neutrophils Relative %: 63 %
Platelet Count: 185 10*3/uL (ref 150–400)
RBC: 3.29 MIL/uL — ABNORMAL LOW (ref 3.87–5.11)
RDW: 13.2 % (ref 11.5–15.5)
WBC Count: 5 10*3/uL (ref 4.0–10.5)
nRBC: 0 % (ref 0.0–0.2)

## 2020-12-24 LAB — LACTATE DEHYDROGENASE: LDH: 220 U/L — ABNORMAL HIGH (ref 98–192)

## 2020-12-24 MED ORDER — FAMOTIDINE 20 MG IN NS 100 ML IVPB
20.0000 mg | Freq: Once | INTRAVENOUS | Status: AC
  Administered 2020-12-24: 20 mg via INTRAVENOUS
  Filled 2020-12-24: qty 100

## 2020-12-24 MED ORDER — ACETAMINOPHEN 325 MG PO TABS
650.0000 mg | ORAL_TABLET | Freq: Once | ORAL | Status: AC
  Administered 2020-12-24: 650 mg via ORAL
  Filled 2020-12-24: qty 2

## 2020-12-24 MED ORDER — SODIUM CHLORIDE 0.9 % IV SOLN: Freq: Once | INTRAVENOUS | Status: AC

## 2020-12-24 MED ORDER — SODIUM CHLORIDE 0.9 % IV SOLN
375.0000 mg/m2 | Freq: Once | INTRAVENOUS | Status: AC
  Administered 2020-12-24: 600 mg via INTRAVENOUS
  Filled 2020-12-24: qty 50

## 2020-12-24 MED ORDER — SODIUM CHLORIDE 0.9 % IV SOLN
10.0000 mg | Freq: Once | INTRAVENOUS | Status: AC
  Administered 2020-12-24: 10 mg via INTRAVENOUS
  Filled 2020-12-24: qty 10

## 2020-12-24 MED ORDER — DIPHENHYDRAMINE HCL 25 MG PO CAPS
50.0000 mg | ORAL_CAPSULE | Freq: Once | ORAL | Status: AC
  Administered 2020-12-24: 50 mg via ORAL
  Filled 2020-12-24: qty 2

## 2020-12-24 MED ORDER — MONTELUKAST SODIUM 10 MG PO TABS
10.0000 mg | ORAL_TABLET | Freq: Once | ORAL | Status: AC
  Administered 2020-12-24: 10 mg via ORAL
  Filled 2020-12-24: qty 1

## 2020-12-24 NOTE — Patient Instructions (Signed)
[facility]  Discharge Instructions: Thank you for choosing [facility] to provide your oncology and hematology care.   If you have a lab appointment with the Cancer Center, please go directly to the Cancer Center and check in at the registration area.   Wear comfortable clothing and clothing appropriate for easy access to any Portacath or PICC line.   We strive to give you quality time with your provider. You may need to reschedule your appointment if you arrive late (15 or more minutes).  Arriving late affects you and other patients whose appointments are after yours.  Also, if you miss three or more appointments without notifying the office, you may be dismissed from the clinic at the provider's discretion.      For prescription refill requests, have your pharmacy contact our office and allow 72 hours for refills to be completed.    Today you received the following chemotherapy and/or immunotherapy agents Rituximab      To help prevent nausea and vomiting after your treatment, we encourage you to take your nausea medication as directed.  BELOW ARE SYMPTOMS THAT SHOULD BE REPORTED IMMEDIATELY: *FEVER GREATER THAN 100.4 F (38 C) OR HIGHER *CHILLS OR SWEATING *NAUSEA AND VOMITING THAT IS NOT CONTROLLED WITH YOUR NAUSEA MEDICATION *UNUSUAL SHORTNESS OF BREATH *UNUSUAL BRUISING OR BLEEDING *URINARY PROBLEMS (pain or burning when urinating, or frequent urination) *BOWEL PROBLEMS (unusual diarrhea, constipation, pain near the anus) TENDERNESS IN MOUTH AND THROAT WITH OR WITHOUT PRESENCE OF ULCERS (sore throat, sores in mouth, or a toothache) UNUSUAL RASH, SWELLING OR PAIN  UNUSUAL VAGINAL DISCHARGE OR ITCHING   Items with * indicate a potential emergency and should be followed up as soon as possible or go to the Emergency Department if any problems should occur.  Please show the CHEMOTHERAPY ALERT CARD or IMMUNOTHERAPY ALERT CARD at check-in to  the Emergency Department and triage nurse.  Should you have questions after your visit or need to cancel or reschedule your appointment, please contact [facility]  Dept: [phone]  and follow the prompts.  Office hours are 8:00 a.m. to 4:30 p.m. Monday - Friday. Please note that voicemails left after 4:00 p.m. may not be returned until the following business day.  We are closed weekends and major holidays. You have access to a nurse at all times for urgent questions. Please call the main number to the clinic Dept: [phone] and follow the prompts.   For any non-urgent questions, you may also contact your provider using MyChart. We now offer e-Visits for anyone 18 and older to request care online for non-urgent symptoms. For details visit [email or web address].   Also download the MyChart app! Go to the app store, search "MyChart", open the app, select [facility], and log in with your MyChart username and password.  Due to Covid, a mask is required upon entering the hospital/clinic. If you do not have a mask, one will be given to you upon arrival. For doctor visits, patients may have 1 support person aged 18 or older with them. For treatment visits, patients cannot have anyone with them due to current Covid guidelines and our immunocompromised population.   

## 2020-12-30 ENCOUNTER — Encounter: Payer: Self-pay | Admitting: Hematology

## 2021-01-05 NOTE — Progress Notes (Signed)
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 is here for evaluation and continued management of Mantle Cell Lymphoma and next dose of maintenance Rituxan. The patient's last visit with [place] was on 10/24/2020. The pt reports that she is doing well overall. She noted a little bit of itching and some tingling around her lips with no swelling after her last dose of Rituxan which resolved quickly with Benadryl.  She was recommended to take Claritin and Pepcid for 3 to 4 days after each dose of her Rituxan. She is not getting rapid Rituxan. Patient notes no new fatigue.  Is emotionally feeling a little better but notes that she still feels a little sad during the holidays from her husband's demise.  Patient notes no fevers chills night sweats new lumps or bumps. P.o. intake has been stable and he she has gained 4 pounds since her last clinic visit 2 months ago.  Lab results today11/16/2022 of CBC w/diff mild anemia with a hemoglobin of 10.9 normal WBC count of 5k and normal platelets of 1185k CMP unremarkable LDH stable at 220<---258  REVIEW OF SYSTEMS:   .10 Point review of Systems was done is negative except as noted above.   Past Medical History:  Diagnosis Date   Allergy    Arthritis    Back pain    Complication of anesthesia    bp dropped yrs ago, recent surgeries went ok   Dysrhythmia    occ, no meds for   Gastritis    GERD (gastroesophageal reflux disease)    H. pylori infection    Headache    migraines occ    Hiatal hernia    Hx of adenomatous colonic polyps 11/30/2017   Hyperlipidemia    Hypothyroidism 1970's   hx of years ago, no meds for    mantle cell lymphoma    Pulmonary nodule    Shortness of breath dyspnea    with exertion, pt told comes from acid reflux    . Past Surgical History:  Procedure Laterality Date   APPENDECTOMY     COLONOSCOPY  12/07/2013   DILATION AND CURETTAGE OF UTERUS     LAPAROSCOPIC CHOLECYSTECTOMY SINGLE SITE WITH INTRAOPERATIVE CHOLANGIOGRAM N/A 10/28/2015   Procedure: LAPAROSCOPIC CHOLECYSTECTOMY WITH INTRAOPERATIVE CHOLANGIOGRAM;  Surgeon: Armandina Gemma, MD;  Location: [facility];  Service: General;  Laterality: N/A;   LAPAROSCOPY  yrs ago   x 2   TMJ ARTHROSCOPY     TOTAL ABDOMINAL HYSTERECTOMY     complete    . Social History   Tobacco Use   Smoking status: Never   Smokeless tobacco: Never  Vaping Use   Vaping Use: Never used  Substance Use Topics   Alcohol use: Yes    Alcohol/week: 4.0 - 5.0 standard drinks    Types: 4 - 5 Cans of beer per week    Comment: 4-5 beers weekly-occ per pt   Drug use: No    ALLERGIES:  has No Known Allergies.  MEDICATIONS:  Current  Outpatient Medications  Medication Sig Dispense Refill   acetaminophen (TYLENOL) 500 MG tablet Take 1,000 mg by mouth every 6 (six) hours as needed for moderate pain.     Cholecalciferol (VITAMIN D3) 5000 units CAPS Take 1 capsule by mouth daily.     cyclobenzaprine (FLEXERIL) 10 MG tablet Take 10 mg by mouth daily as needed for muscle spasms.     fluticasone (FLONASE) 50 MCG/ACT nasal spray Place 1 spray into both nostrils daily as needed for allergies.     HYDROcodone-acetaminophen (NORCO/VICODIN) 5-325 MG tablet Take 1-2 tablets by mouth every 4 (four) hours as needed for moderate pain. 20 tablet 0   omeprazole (PRILOSEC) 20 MG capsule Take 20 mg by mouth every morning.     rosuvastatin (CRESTOR) 5 MG tablet Take 5 mg by mouth daily.     vitamin B-12 (CYANOCOBALAMIN) 500 MCG  tablet Take 500 mcg by mouth daily.     No current facility-administered medications for this visit.    PHYSICAL EXAMINATION: .BP (!) 142/84   Pulse 81   Temp (!) 97.5 F (36.4 C)   Resp 18   Wt 127 lb 8 oz (57.8 kg)   SpO2 100%   BMI 22.59 kg/m   NAD . GENERAL:alert, in no acute distress and comfortable SKIN: no acute rashes, no significant lesions EYES: conjunctiva are pink and non-injected, sclera anicteric OROPHARYNX: MMM, no exudates, no oropharyngeal erythema or ulceration NECK: supple, no JVD LYMPH:  no palpable lymphadenopathy in the cervical, axillary or inguinal regions LUNGS: clear to auscultation b/l with normal respiratory effort HEART: regular rate & rhythm ABDOMEN:  normoactive bowel sounds , non tender, not distended. Extremity: no pedal edema PSYCH: alert & oriented x 3 with fluent speech NEURO: no focal motor/sensory deficits     LABORATORY DATA:   I have reviewed the data as listed   CBC Latest Ref Rng & Units 12/24/2020 10/24/2020 08/22/2020  WBC 4.0 - 10.5 K/uL 5.0 6.5 5.7  Hemoglobin 12.0 - 15.0 g/dL 10.9(L) 10.9(L) 11.5(L)  Hematocrit 36.0 - 46.0 % 31.6(L) 32.4(L) 33.4(L)  Platelets 150 - 400 K/uL 185 190 209    . CMP Latest Ref Rng & Units 12/24/2020 10/24/2020 08/22/2020  Glucose 70 - 99 mg/dL 79 99 85  BUN 8 - 23 mg/dL 17 26(H) 18  Creatinine 0.44 - 1.00 mg/dL 0.94 0.88 0.87  Sodium 135 - 145 mmol/L 142 137 135  Potassium 3.5 - 5.1 mmol/L 3.8 4.0 4.1  Chloride 98 - 111 mmol/L 107 106 102  CO2 22 - 32 mmol/L 26 21(L) 24  Calcium 8.9 - 10.3 mg/dL 9.4 9.9 10.1  Total Protein 6.5 - 8.1 g/dL 6.6 6.7 6.9  Total Bilirubin 0.3 - 1.2 mg/dL 0.5 0.5 0.6  Alkaline Phos 38 - 126 U/L 64 73 73  AST 15 - 41 U/L 22 24 23   ALT 0 - 44 U/L 15 16 14    . Lab Results  Component Value Date   LDH 220 (H) 12/24/2020   RADIOGRAPHIC STUDIES: I have personally reviewed the radiological images as listed and agreed with the findings in the report. No  results found.   ASSESSMENT & PLAN:   75 yo with   1) Stage III/IV Mantle cell lymphoma 01/11/2020 PET/CT ([id number]) revealed no visible disease, continued resolution. 2) s/p Allergic reaction to Rapid Rituxan. - rash and low grade fever -  resolved with solumedrol/tylenol, benadryl and famotidine   PLAN: -Discussed pt labwork today, 12/24/2020; counts stable and improved. Chemistries unremarkable. -  LDL slightly elevated with improved/stable. -patient has no overt lab or clinical evidence of mantle cell lymphoma recurrence/progression at this time. -She notes -No prohibitive toxicities from continuing maintenance Rituxan at this time. -Grade 1 itching recommended taking Claritin 10 mg p.o. daily and Pepcid 20 mg p.o. twice daily for 3 to 5 days after each dose of Rituxan.  Might use Benadryl as needed as well. -Continue Vitamin B-complex daily. -Continue 2000 IU Vitamin D daily. -Will see back in 2 months with labs.   FOLLOW UP: Follow-up as per neck schedule Rituxan infusion appointments on 02/23/2021  . The total time spent in the appointment was 30 minutes and more than 50% was on counseling and direct patient cares, ordering and management of Rituxan immunotherapy.  All of the patient's questions were answered with apparent satisfaction. The patient knows to call the clinic with any problems, questions or concerns.   Sullivan Lone MD [place] AAHIVMS [facility] Hematology/Oncology Physician [facility]

## 2021-01-19 DIAGNOSIS — G8929 Other chronic pain: Secondary | ICD-10-CM | POA: Diagnosis not present

## 2021-01-22 DIAGNOSIS — H524 Presbyopia: Secondary | ICD-10-CM | POA: Diagnosis not present

## 2021-01-22 DIAGNOSIS — H52223 Regular astigmatism, bilateral: Secondary | ICD-10-CM | POA: Diagnosis not present

## 2021-01-22 DIAGNOSIS — H5203 Hypermetropia, bilateral: Secondary | ICD-10-CM | POA: Diagnosis not present

## 2021-01-22 DIAGNOSIS — H2523 Age-related cataract, morgagnian type, bilateral: Secondary | ICD-10-CM | POA: Diagnosis not present

## 2021-01-26 DIAGNOSIS — H5213 Myopia, bilateral: Secondary | ICD-10-CM | POA: Diagnosis not present

## 2021-02-23 ENCOUNTER — Other Ambulatory Visit: Payer: Self-pay

## 2021-02-23 ENCOUNTER — Inpatient Hospital Stay: Payer: Medicare Other | Attending: Hematology

## 2021-02-23 ENCOUNTER — Inpatient Hospital Stay: Payer: Medicare Other

## 2021-02-23 ENCOUNTER — Inpatient Hospital Stay (HOSPITAL_BASED_OUTPATIENT_CLINIC_OR_DEPARTMENT_OTHER): Payer: Medicare Other | Admitting: Hematology

## 2021-02-23 VITALS — BP 126/74 | HR 94 | Temp 98.7°F | Resp 16

## 2021-02-23 VITALS — BP 132/77 | HR 92 | Temp 97.9°F | Resp 16 | Ht 63.0 in | Wt 130.4 lb

## 2021-02-23 DIAGNOSIS — Z79899 Other long term (current) drug therapy: Secondary | ICD-10-CM | POA: Diagnosis not present

## 2021-02-23 DIAGNOSIS — C8318 Mantle cell lymphoma, lymph nodes of multiple sites: Secondary | ICD-10-CM

## 2021-02-23 DIAGNOSIS — Z7189 Other specified counseling: Secondary | ICD-10-CM

## 2021-02-23 DIAGNOSIS — Z5112 Encounter for antineoplastic immunotherapy: Secondary | ICD-10-CM | POA: Insufficient documentation

## 2021-02-23 DIAGNOSIS — D702 Other drug-induced agranulocytosis: Secondary | ICD-10-CM

## 2021-02-23 LAB — CMP (CANCER CENTER ONLY)
ALT: 15 U/L (ref 0–44)
AST: 22 U/L (ref 15–41)
Albumin: 4.4 g/dL (ref 3.5–5.0)
Alkaline Phosphatase: 64 U/L (ref 38–126)
Anion gap: 8 (ref 5–15)
BUN: 26 mg/dL — ABNORMAL HIGH (ref 8–23)
CO2: 27 mmol/L (ref 22–32)
Calcium: 10 mg/dL (ref 8.9–10.3)
Chloride: 102 mmol/L (ref 98–111)
Creatinine: 0.95 mg/dL (ref 0.44–1.00)
GFR, Estimated: >60 mL/min (ref >60)
Glucose, Bld: 79 mg/dL (ref 70–99)
Potassium: 4.1 mmol/L (ref 3.5–5.1)
Sodium: 137 mmol/L (ref 135–145)
Total Bilirubin: 0.5 mg/dL (ref 0.3–1.2)
Total Protein: 6.7 g/dL (ref 6.5–8.1)

## 2021-02-23 LAB — CBC WITH DIFFERENTIAL (CANCER CENTER ONLY)
Abs Immature Granulocytes: 0.05 10*3/uL (ref 0.00–0.07)
Basophils Absolute: 0.1 10*3/uL (ref 0.0–0.1)
Basophils Relative: 1 %
Eosinophils Absolute: 0.1 10*3/uL (ref 0.0–0.5)
Eosinophils Relative: 1 %
HCT: 34.5 % — ABNORMAL LOW (ref 36.0–46.0)
Hemoglobin: 11.5 g/dL — ABNORMAL LOW (ref 12.0–15.0)
Immature Granulocytes: 1 %
Lymphocytes Relative: 20 %
Lymphs Abs: 1.2 10*3/uL (ref 0.7–4.0)
MCH: 32.2 pg (ref 26.0–34.0)
MCHC: 33.3 g/dL (ref 30.0–36.0)
MCV: 96.6 fL (ref 80.0–100.0)
Monocytes Absolute: 1 10*3/uL (ref 0.1–1.0)
Monocytes Relative: 17 %
Neutro Abs: 3.6 10*3/uL (ref 1.7–7.7)
Neutrophils Relative %: 60 %
Platelet Count: 209 10*3/uL (ref 150–400)
RBC: 3.57 MIL/uL — ABNORMAL LOW (ref 3.87–5.11)
RDW: 12.7 % (ref 11.5–15.5)
WBC Count: 5.9 10*3/uL (ref 4.0–10.5)
nRBC: 0 % (ref 0.0–0.2)

## 2021-02-23 LAB — LACTATE DEHYDROGENASE: LDH: 189 U/L (ref 98–192)

## 2021-02-23 MED ORDER — FAMOTIDINE 20 MG IN NS 100 ML IVPB
20.0000 mg | Freq: Once | INTRAVENOUS | Status: AC
  Administered 2021-02-23: 20 mg via INTRAVENOUS

## 2021-02-23 MED ORDER — MONTELUKAST SODIUM 10 MG PO TABS
10.0000 mg | ORAL_TABLET | Freq: Once | ORAL | Status: AC
  Administered 2021-02-23: 10 mg via ORAL

## 2021-02-23 MED ORDER — SODIUM CHLORIDE 0.9 % IV SOLN
375.0000 mg/m2 | Freq: Once | INTRAVENOUS | Status: AC
  Administered 2021-02-23: 600 mg via INTRAVENOUS
  Filled 2021-02-23: qty 50

## 2021-02-23 MED ORDER — SODIUM CHLORIDE 0.9 % IV SOLN
10.0000 mg | Freq: Once | INTRAVENOUS | Status: AC
  Administered 2021-02-23: 10 mg via INTRAVENOUS
  Filled 2021-02-23: qty 10

## 2021-02-23 MED ORDER — SODIUM CHLORIDE 0.9 % IV SOLN: Freq: Once | INTRAVENOUS | Status: AC

## 2021-02-23 MED ORDER — ACETAMINOPHEN 325 MG PO TABS
650.0000 mg | ORAL_TABLET | Freq: Once | ORAL | Status: AC
  Administered 2021-02-23: 650 mg via ORAL

## 2021-02-23 MED ORDER — DIPHENHYDRAMINE HCL 25 MG PO CAPS
50.0000 mg | ORAL_CAPSULE | Freq: Once | ORAL | Status: AC
  Administered 2021-02-23: 50 mg via ORAL

## 2021-02-23 NOTE — Patient Instructions (Signed)
[facility] MEDICAL [facility]COLOG[facility]  Discharge Instructions: Thank you for choosing [facility] to provide your oncology and hematology care.   If you have a lab appointment with the Cancer Center, please go directly to the Cancer Center and check in at the registration area.   Wear comfortable clothing and clothing appropriate for easy access to any Portacath or PICC line.   We strive to give you quality time with your provider. You may need to reschedule your appointment if you arrive late (15 or more minutes).  Arriving late affects you and other patients whose appointments are after yours.  Also, if you miss three or more appointments without notifying the office, you may be dismissed from the clinic at the provider's discretion.      For prescription refill requests, have your pharmacy contact our office and allow 72 hours for refills to be completed.    Today you received the following chemotherapy and/or immunotherapy agents Rituxan      To help prevent nausea and vomiting after your treatment, we encourage you to take your nausea medication as directed.  BELOW ARE SYMPTOMS THAT SHOULD BE REPORTED IMMEDIATELY: *FEVER GREATER THAN 100.4 F (38 C) OR HIGHER *CHILLS OR SWEATING *NAUSEA AND VOMITING THAT IS NOT CONTROLLED WITH YOUR NAUSEA MEDICATION *UNUSUAL SHORTNESS OF BREATH *UNUSUAL BRUISING OR BLEEDING *URINARY PROBLEMS (pain or burning when urinating, or frequent urination) *BOWEL PROBLEMS (unusual diarrhea, constipation, pain near the anus) TENDERNESS IN MOUTH AND THROAT WITH OR WITHOUT PRESENCE OF ULCERS (sore throat, sores in mouth, or a toothache) UNUSUAL RASH, SWELLING OR PAIN  UNUSUAL VAGINAL DISCHARGE OR ITCHING   Items with * indicate a potential emergency and should be followed up as soon as possible or go to the Emergency Department if any problems should occur.  Please show the CHEMOTHERAPY ALERT CARD or IMMUNOTHERAPY ALERT CARD at check-in to the  Emergency Department and triage nurse.  Should you have questions after your visit or need to cancel or reschedule your appointment, please contact [facility]  Dept: [phone]  and follow the prompts.  Office hours are 8:00 a.m. to 4:30 p.m. Monday - Friday. Please note that voicemails left after 4:00 p.m. may not be returned until the following business day.  We are closed weekends and major holidays. You have access to a nurse at all times for urgent questions. Please call the main number to the clinic Dept: [phone] and follow the prompts.   For any non-urgent questions, you may also contact your provider using MyChart. We now offer e-Visits for anyone 18 and older to request care online for non-urgent symptoms. For details visit [email or web address].   Also download the MyChart app! Go to the app store, search "MyChart", open the app, select [place], and log in with your MyChart username and password.  Due to Covid, a mask is required upon entering the hospital/clinic. If you do not have a mask, one will be given to you upon arrival. For doctor visits, patients may have 1 support person aged 18 or older with them. For treatment visits, patients cannot have anyone with them due to current Covid guidelines and our immunocompromised population.   

## 2021-02-25 ENCOUNTER — Telehealth: Payer: Self-pay | Admitting: Hematology

## 2021-02-25 NOTE — Telephone Encounter (Signed)
Scheduled follow-up appointments per 1/16 los. Patient is aware. °

## 2021-02-26 DIAGNOSIS — F419 Anxiety disorder, unspecified: Secondary | ICD-10-CM | POA: Diagnosis not present

## 2021-02-26 DIAGNOSIS — G8929 Other chronic pain: Secondary | ICD-10-CM | POA: Diagnosis not present

## 2021-03-01 ENCOUNTER — Encounter: Payer: Self-pay | Admitting: Hematology

## 2021-03-01 NOTE — Progress Notes (Signed)
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 Perdew is here for follow-up of her mantle cell lymphoma next dose of maintenance Rituxan. She notes no acute new symptoms since her last clinic visit.  No reaction to the last dose of Rituxan. New infection issues. No fevers no chills no night sweats. No unexpected weight loss. No new lumps or bumps  Labs today CBC with a hemoglobin of 11.5 normal WBC count and platelets, LDH 189, CMP within normal limits.   REVIEW OF SYSTEMS:   .10 Point review of Systems was done is negative except as noted above.  Past Medical History:  Diagnosis Date   Allergy    Arthritis    Back pain    Complication of anesthesia    bp dropped yrs ago, recent surgeries went ok   Dysrhythmia    occ, no meds for   Gastritis    GERD (gastroesophageal reflux disease)    H. pylori infection    Headache    migraines occ   Hiatal hernia    Hx of adenomatous colonic polyps 11/30/2017   Hyperlipidemia    Hypothyroidism 1970's   hx of years ago, no meds for    mantle cell lymphoma    Pulmonary nodule    Shortness of breath dyspnea    with exertion, pt told comes from acid reflux    . Past Surgical History:  Procedure Laterality Date   APPENDECTOMY     COLONOSCOPY  12/07/2013   DILATION AND CURETTAGE OF UTERUS     LAPAROSCOPIC CHOLECYSTECTOMY SINGLE SITE WITH INTRAOPERATIVE CHOLANGIOGRAM N/A 10/28/2015   Procedure: LAPAROSCOPIC  CHOLECYSTECTOMY WITH INTRAOPERATIVE CHOLANGIOGRAM;  Surgeon: Armandina Gemma, MD;  Location: [facility];  Service: General;  Laterality: N/A;   LAPAROSCOPY  yrs ago   x 2   TMJ ARTHROSCOPY     TOTAL ABDOMINAL HYSTERECTOMY     complete    . Social History   Tobacco Use   Smoking status: Never   Smokeless tobacco: Never  Vaping Use   Vaping Use: Never used  Substance Use Topics   Alcohol use: Yes    Alcohol/week: 4.0 - 5.0 standard drinks    Types: 4 - 5 Cans of beer per week    Comment: 4-5 beers weekly-occ per pt   Drug use: No    ALLERGIES:  has No Known Allergies.  MEDICATIONS:  Current Outpatient Medications  Medication Sig Dispense Refill   acetaminophen (TYLENOL) 500 MG tablet Take 1,000 mg by mouth every 6 (six) hours as needed for moderate pain.     Cholecalciferol (VITAMIN D3) 5000 units CAPS Take 1 capsule by mouth daily.     cyclobenzaprine (FLEXERIL) 10 MG tablet Take 10 mg by mouth daily as needed for muscle spasms.     fluticasone (FLONASE) 50 MCG/ACT nasal spray Place 1 spray into both nostrils daily as needed for allergies.     HYDROcodone-acetaminophen (NORCO/VICODIN) 5-325 MG tablet Take 1-2 tablets by  mouth every 4 (four) hours as needed for moderate pain. 20 tablet 0   omeprazole (PRILOSEC) 20 MG capsule Take 20 mg by mouth every morning.     rosuvastatin (CRESTOR) 5 MG tablet Take 5 mg by mouth daily.     vitamin B-12 (CYANOCOBALAMIN) 500 MCG tablet Take 500 mcg by mouth daily.     No current facility-administered medications for this visit.    PHYSICAL EXAMINATION: .BP 132/77 (BP Location: Left Arm, Patient Position: Sitting)    Pulse 92    Temp 97.9 F (36.6 C) (Temporal)    Resp 16    Ht 5\' 3"  (1.6 m)    Wt 130 lb 6.4 oz (59.1 kg)    SpO2 100%    BMI 23.10 kg/m  NAD . GENERAL:alert, in no acute distress and comfortable SKIN: no acute rashes, no significant lesions EYES: conjunctiva are pink and non-injected, sclera anicteric OROPHARYNX: MMM, no  exudates, no oropharyngeal erythema or ulceration NECK: supple, no JVD LYMPH:  no palpable lymphadenopathy in the cervical, axillary or inguinal regions LUNGS: clear to auscultation b/l with normal respiratory effort HEART: regular rate & rhythm ABDOMEN:  normoactive bowel sounds , non tender, not distended. Extremity: no pedal edema PSYCH: alert & oriented x 3 with fluent speech NEURO: no focal motor/sensory deficits  LABORATORY DATA:   I have reviewed the data as listed   CBC Latest Ref Rng & Units 02/23/2021 12/24/2020 10/24/2020  WBC 4.0 - 10.5 K/uL 5.9 5.0 6.5  Hemoglobin 12.0 - 15.0 g/dL 11.5(L) 10.9(L) 10.9(L)  Hematocrit 36.0 - 46.0 % 34.5(L) 31.6(L) 32.4(L)  Platelets 150 - 400 K/uL 209 185 190    . CMP Latest Ref Rng & Units 02/23/2021 12/24/2020 10/24/2020  Glucose 70 - 99 mg/dL 79 79 99  BUN 8 - 23 mg/dL 26(H) 17 26(H)  Creatinine 0.44 - 1.00 mg/dL 0.95 0.94 0.88  Sodium 135 - 145 mmol/L 137 142 137  Potassium 3.5 - 5.1 mmol/L 4.1 3.8 4.0  Chloride 98 - 111 mmol/L 102 107 106  CO2 22 - 32 mmol/L 27 26 21(L)  Calcium 8.9 - 10.3 mg/dL 10.0 9.4 9.9  Total Protein 6.5 - 8.1 g/dL 6.7 6.6 6.7  Total Bilirubin 0.3 - 1.2 mg/dL 0.5 0.5 0.5  Alkaline Phos 38 - 126 U/L 64 64 73  AST 15 - 41 U/L 22 22 24   ALT 0 - 44 U/L 15 15 16    . Lab Results  Component Value Date   LDH 189 02/23/2021   RADIOGRAPHIC STUDIES: I have personally reviewed the radiological images as listed and agreed with the findings in the report. No results found.   ASSESSMENT & PLAN:   76 yo with   1) Stage III/IV Mantle cell lymphoma 01/11/2020 PET/CT ([id number]) revealed no visible disease, continued resolution. 2) s/p hypersensitivity reaction to Rapid Rituxan. - rash and low grade fever -  resolved with solumedrol/tylenol, benadryl and famotidine   PLAN: -Discussed lab results from today showing stable CBC, CMP and normal LDH. -No overt clinical or lab evidence of mantle cell lymphoma  progression at this time. -No prohibitive toxicities from continuing her maintenance Rituxan at this time with the same supportive medications. -She will continue taking Claritin 10 mg p.o. daily and Pepcid 20 mg p.o. twice daily for 3 to 5 days after each dose of Rituxan.  Might use Benadryl as needed as well. -Continue Vitamin B-complex daily. -Continue 2000 IU Vitamin D daily. -Will see back in 2 months with labs.  FOLLOW UP: Please schedule next 2 cycles of maintenance Rituxan 2 months with labs and MD visit   All of the patient's questions were answered with apparent satisfaction. The patient knows to call the clinic with any problems, questions or concerns.   Sullivan Lone MD [place] AAHIVMS [facility] Hematology/Oncology Physician [facility]

## 2021-03-30 DIAGNOSIS — F419 Anxiety disorder, unspecified: Secondary | ICD-10-CM | POA: Diagnosis not present

## 2021-03-30 DIAGNOSIS — C8590 Non-Hodgkin lymphoma, unspecified, unspecified site: Secondary | ICD-10-CM | POA: Diagnosis not present

## 2021-03-30 DIAGNOSIS — G8929 Other chronic pain: Secondary | ICD-10-CM | POA: Diagnosis not present

## 2021-04-07 DIAGNOSIS — H2513 Age-related nuclear cataract, bilateral: Secondary | ICD-10-CM | POA: Diagnosis not present

## 2021-04-07 DIAGNOSIS — H25013 Cortical age-related cataract, bilateral: Secondary | ICD-10-CM | POA: Diagnosis not present

## 2021-04-07 DIAGNOSIS — H18413 Arcus senilis, bilateral: Secondary | ICD-10-CM | POA: Diagnosis not present

## 2021-04-07 DIAGNOSIS — H25043 Posterior subcapsular polar age-related cataract, bilateral: Secondary | ICD-10-CM | POA: Diagnosis not present

## 2021-04-24 ENCOUNTER — Inpatient Hospital Stay: Payer: Medicare Other

## 2021-04-24 ENCOUNTER — Inpatient Hospital Stay: Payer: Medicare Other | Attending: Hematology

## 2021-04-24 ENCOUNTER — Other Ambulatory Visit: Payer: Self-pay

## 2021-04-24 ENCOUNTER — Other Ambulatory Visit: Payer: Self-pay | Admitting: Hematology

## 2021-04-24 ENCOUNTER — Inpatient Hospital Stay (HOSPITAL_BASED_OUTPATIENT_CLINIC_OR_DEPARTMENT_OTHER): Payer: Medicare Other | Admitting: Hematology

## 2021-04-24 VITALS — BP 138/80 | HR 77 | Temp 98.4°F | Resp 16

## 2021-04-24 VITALS — BP 137/80 | HR 83 | Temp 97.0°F | Resp 20 | Wt 131.2 lb

## 2021-04-24 DIAGNOSIS — Z5112 Encounter for antineoplastic immunotherapy: Secondary | ICD-10-CM

## 2021-04-24 DIAGNOSIS — Z7189 Other specified counseling: Secondary | ICD-10-CM

## 2021-04-24 DIAGNOSIS — C8318 Mantle cell lymphoma, lymph nodes of multiple sites: Secondary | ICD-10-CM | POA: Diagnosis not present

## 2021-04-24 DIAGNOSIS — D702 Other drug-induced agranulocytosis: Secondary | ICD-10-CM

## 2021-04-24 LAB — CBC WITH DIFFERENTIAL (CANCER CENTER ONLY)
Abs Immature Granulocytes: 0.08 10*3/uL — ABNORMAL HIGH (ref 0.00–0.07)
Basophils Absolute: 0 10*3/uL (ref 0.0–0.1)
Basophils Relative: 1 %
Eosinophils Absolute: 0 10*3/uL (ref 0.0–0.5)
Eosinophils Relative: 1 %
HCT: 33.2 % — ABNORMAL LOW (ref 36.0–46.0)
Hemoglobin: 11.2 g/dL — ABNORMAL LOW (ref 12.0–15.0)
Immature Granulocytes: 1 %
Lymphocytes Relative: 15 %
Lymphs Abs: 0.9 10*3/uL (ref 0.7–4.0)
MCH: 32.5 pg (ref 26.0–34.0)
MCHC: 33.7 g/dL (ref 30.0–36.0)
MCV: 96.2 fL (ref 80.0–100.0)
Monocytes Absolute: 0.8 10*3/uL (ref 0.1–1.0)
Monocytes Relative: 15 %
Neutro Abs: 3.8 10*3/uL (ref 1.7–7.7)
Neutrophils Relative %: 67 %
Platelet Count: 212 10*3/uL (ref 150–400)
RBC: 3.45 MIL/uL — ABNORMAL LOW (ref 3.87–5.11)
RDW: 13.1 % (ref 11.5–15.5)
WBC Count: 5.6 10*3/uL (ref 4.0–10.5)
nRBC: 0 % (ref 0.0–0.2)

## 2021-04-24 LAB — CMP (CANCER CENTER ONLY)
ALT: 11 U/L (ref 0–44)
AST: 20 U/L (ref 15–41)
Albumin: 4.5 g/dL (ref 3.5–5.0)
Alkaline Phosphatase: 65 U/L (ref 38–126)
Anion gap: 7 (ref 5–15)
BUN: 18 mg/dL (ref 8–23)
CO2: 27 mmol/L (ref 22–32)
Calcium: 9.6 mg/dL (ref 8.9–10.3)
Chloride: 105 mmol/L (ref 98–111)
Creatinine: 0.84 mg/dL (ref 0.44–1.00)
GFR, Estimated: >60 mL/min
Glucose, Bld: 88 mg/dL (ref 70–99)
Potassium: 3.7 mmol/L (ref 3.5–5.1)
Sodium: 139 mmol/L (ref 135–145)
Total Bilirubin: 0.8 mg/dL (ref 0.3–1.2)
Total Protein: 6.8 g/dL (ref 6.5–8.1)

## 2021-04-24 LAB — LACTATE DEHYDROGENASE: LDH: 190 U/L (ref 98–192)

## 2021-04-24 MED ORDER — DIPHENHYDRAMINE HCL 25 MG PO CAPS
50.0000 mg | ORAL_CAPSULE | Freq: Once | ORAL | Status: AC
  Administered 2021-04-24: 50 mg via ORAL
  Filled 2021-04-24: qty 2

## 2021-04-24 MED ORDER — SODIUM CHLORIDE 0.9 % IV SOLN
10.0000 mg | Freq: Once | INTRAVENOUS | Status: AC
  Administered 2021-04-24: 10 mg via INTRAVENOUS
  Filled 2021-04-24: qty 10

## 2021-04-24 MED ORDER — FAMOTIDINE IN NACL 20-0.9 MG/50ML-% IV SOLN
20.0000 mg | Freq: Once | INTRAVENOUS | Status: AC
  Administered 2021-04-24: 20 mg via INTRAVENOUS
  Filled 2021-04-24: qty 50

## 2021-04-24 MED ORDER — SODIUM CHLORIDE 0.9 % IV SOLN: Freq: Once | INTRAVENOUS | Status: AC

## 2021-04-24 MED ORDER — ACETAMINOPHEN 325 MG PO TABS
650.0000 mg | ORAL_TABLET | Freq: Once | ORAL | Status: AC
  Administered 2021-04-24: 650 mg via ORAL
  Filled 2021-04-24: qty 2

## 2021-04-24 MED ORDER — SODIUM CHLORIDE 0.9 % IV SOLN
375.0000 mg/m2 | Freq: Once | INTRAVENOUS | Status: AC
  Administered 2021-04-24: 600 mg via INTRAVENOUS
  Filled 2021-04-24: qty 50

## 2021-04-24 MED ORDER — MONTELUKAST SODIUM 10 MG PO TABS
10.0000 mg | ORAL_TABLET | Freq: Once | ORAL | Status: AC
  Administered 2021-04-24: 10 mg via ORAL
  Filled 2021-04-24: qty 1

## 2021-04-24 NOTE — Patient Instructions (Signed)
[facility]  Discharge Instructions: Thank you for choosing [facility] to provide your oncology and hematology care.   If you have a lab appointment with the Cancer Center, please go directly to the Cancer Center and check in at the registration area.   Wear comfortable clothing and clothing appropriate for easy access to any Portacath or PICC line.   We strive to give you quality time with your provider. You may need to reschedule your appointment if you arrive late (15 or more minutes).  Arriving late affects you and other patients whose appointments are after yours.  Also, if you miss three or more appointments without notifying the office, you may be dismissed from the clinic at the provider's discretion.      For prescription refill requests, have your pharmacy contact our office and allow 72 hours for refills to be completed.    Today you received the following chemotherapy and/or immunotherapy agents rituxan      To help prevent nausea and vomiting after your treatment, we encourage you to take your nausea medication as directed.  BELOW ARE SYMPTOMS THAT SHOULD BE REPORTED IMMEDIATELY: *FEVER GREATER THAN 100.4 F (38 C) OR HIGHER *CHILLS OR SWEATING *NAUSEA AND VOMITING THAT IS NOT CONTROLLED WITH YOUR NAUSEA MEDICATION *UNUSUAL SHORTNESS OF BREATH *UNUSUAL BRUISING OR BLEEDING *URINARY PROBLEMS (pain or burning when urinating, or frequent urination) *BOWEL PROBLEMS (unusual diarrhea, constipation, pain near the anus) TENDERNESS IN MOUTH AND THROAT WITH OR WITHOUT PRESENCE OF ULCERS (sore throat, sores in mouth, or a toothache) UNUSUAL RASH, SWELLING OR PAIN  UNUSUAL VAGINAL DISCHARGE OR ITCHING   Items with * indicate a potential emergency and should be followed up as soon as possible or go to the Emergency Department if any problems should occur.  Please show the CHEMOTHERAPY ALERT CARD or IMMUNOTHERAPY ALERT CARD at check-in to the  Emergency Department and triage nurse.  Should you have questions after your visit or need to cancel or reschedule your appointment, please contact [facility]  Dept: [phone]  and follow the prompts.  Office hours are 8:00 a.m. to 4:30 p.m. Monday - Friday. Please note that voicemails left after 4:00 p.m. may not be returned until the following business day.  We are closed weekends and major holidays. You have access to a nurse at all times for urgent questions. Please call the main number to the clinic Dept: [phone] and follow the prompts.   For any non-urgent questions, you may also contact your provider using MyChart. We now offer e-Visits for anyone 18 and older to request care online for non-urgent symptoms. For details visit [email or web address].   Also download the MyChart app! Go to the app store, search "MyChart", open the app, select [place], and log in with your MyChart username and password.  Due to Covid, a mask is required upon entering the hospital/clinic. If you do not have a mask, one will be given to you upon arrival. For doctor visits, patients may have 1 support person aged 18 or older with them. For treatment visits, patients cannot have anyone with them due to current Covid guidelines and our immunocompromised population.   

## 2021-04-29 DIAGNOSIS — G8929 Other chronic pain: Secondary | ICD-10-CM | POA: Diagnosis not present

## 2021-04-29 DIAGNOSIS — F419 Anxiety disorder, unspecified: Secondary | ICD-10-CM | POA: Diagnosis not present

## 2021-04-30 ENCOUNTER — Encounter: Payer: Self-pay | Admitting: Hematology

## 2021-04-30 NOTE — Progress Notes (Signed)
. ? ?HEMATOLOGY ONCOLOGY PROGRESS NOTE ? ?Date of service: .04/24/2021 ? ?Patient Care Team: ?Janith Lima, MD as PCP - General (Internal Medicine) ? ?CC ? ?Follow-up for continued evaluation and management of mantle cell lymphoma ? 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  ?10/19/2019 -  Chemotherapy  ? Patient is on Treatment Plan : NON-HODGKINS LYMPHOMA Rituximab D1 / Bendamustine D1,2 q28d  ?   ? ? ?INTERVAL HISTORY: ? ?Brooke [name] is here for continued evaluation and management of mantle cell lymphoma and her neck cycle of maintenance Rituxan. ?She notes no acute new concerns since her last clinic visit. ?No prohibitive toxicities from her last dose of Rituxan. ?No fevers no chills no night sweats no unexpected weight loss.  No new lumps or bumps. ?No chest pain or shortness of breath.  No abdominal pain or distention. ?Labs done today reviewed with the patient ? ?REVIEW OF SYSTEMS:   ?10 Point review of Systems was done is negative except as noted above. ? ?Past Medical History:  ?Diagnosis Date  ? Allergy   ? Arthritis   ? Back pain   ? Complication of anesthesia   ? bp dropped yrs ago, recent surgeries went ok  ? Dysrhythmia   ? occ, no meds for  ? Gastritis   ? GERD (gastroesophageal reflux disease)   ? H. pylori infection   ? Headache   ? migraines occ  ? Hiatal hernia   ? Hx of adenomatous colonic polyps 11/30/2017  ? Hyperlipidemia   ? Hypothyroidism 1970's  ? hx of years ago, no meds for   ? mantle cell lymphoma   ? Pulmonary nodule   ? Shortness of breath dyspnea   ? with exertion, pt told comes from acid reflux  ? ? ?. ?Past Surgical History:  ?Procedure Laterality Date  ? APPENDECTOMY    ? COLONOSCOPY  12/07/2013  ? DILATION AND CURETTAGE OF UTERUS    ? LAPAROSCOPIC CHOLECYSTECTOMY SINGLE SITE WITH INTRAOPERATIVE CHOLANGIOGRAM  N/A 10/28/2015  ? Procedure: LAPAROSCOPIC CHOLECYSTECTOMY WITH INTRAOPERATIVE CHOLANGIOGRAM;  Surgeon: Armandina Gemma, MD;  Location: [facility];  Service: General;  Laterality: N/A;  ? LAPAROSCOPY  yrs ago  ? x 2  ? TMJ ARTHROSCOPY    ? TOTAL ABDOMINAL HYSTERECTOMY    ? complete  ? ? ?. ?Social History  ? ?Tobacco Use  ? Smoking status: Never  ? Smokeless tobacco: Never  ?Vaping Use  ? Vaping Use: Never used  ?Substance Use Topics  ? Alcohol use: Yes  ?  Alcohol/week: 4.0 - 5.0 standard drinks  ?  Types: 4 - 5 Cans of beer per week  ?  Comment: 4-5 beers weekly-occ per pt  ? Drug use: No  ? ? ?ALLERGIES:  has No Known Allergies. ? ?MEDICATIONS:  ?Current Outpatient Medications  ?Medication Sig Dispense Refill  ? acetaminophen (TYLENOL) 500 MG tablet Take 1,000 mg by mouth every 6 (six) hours as needed for moderate pain.    ? Cholecalciferol (VITAMIN D3) 5000 units CAPS Take 1 capsule by mouth daily.    ? cyclobenzaprine (FLEXERIL) 10 MG tablet Take 10 mg by mouth daily as needed for muscle spasms.    ? fluticasone (FLONASE) 50 MCG/ACT nasal spray Place 1 spray into both nostrils daily as needed for allergies.    ? HYDROcodone-acetaminophen (NORCO/VICODIN) 5-325 MG tablet  Take 1-2 tablets by mouth every 4 (four) hours as needed for moderate pain. 20 tablet 0  ? omeprazole (PRILOSEC) 20 MG capsule Take 20 mg by mouth every morning.    ? rosuvastatin (CRESTOR) 5 MG tablet Take 5 mg by mouth daily.    ? vitamin B-12 (CYANOCOBALAMIN) 500 MCG tablet Take 500 mcg by mouth daily.    ? ?No current facility-administered medications for this visit.  ? ? ?PHYSICAL EXAMINATION: ?.BP 137/80   Pulse 83   Temp (!) 97 ?F (36.1 ?C)   Resp 20   Wt 131 lb 3.2 oz (59.5 kg)   SpO2 99%   BMI 23.24 kg/m?  ?NAD ?GENERAL:alert, in no acute distress and comfortable ?SKIN: no acute rashes, no significant lesions ?EYES: conjunctiva are pink and non-injected, sclera anicteric ?OROPHARYNX: MMM, no exudates, no oropharyngeal erythema or  ulceration ?NECK: supple, no JVD ?LYMPH:  no palpable lymphadenopathy in the cervical, axillary or inguinal regions ?LUNGS: clear to auscultation b/l with normal respiratory effort ?HEART: regular rate & rhythm ?ABDOMEN:  normoactive bowel sounds , non tender, not distended. ?Extremity: no pedal edema ?PSYCH: alert & oriented x 3 with fluent speech ?NEURO: no focal motor/sensory deficits ? ?LABORATORY DATA:  ? ?I have reviewed the data as listed ? ? ? ?  Latest Ref Rng & Units 04/24/2021  ? 10:27 AM 02/23/2021  ? 10:12 AM 12/24/2020  ? 10:03 AM  ?CBC  ?WBC 4.0 - 10.5 K/uL 5.6   5.9   5.0    ?Hemoglobin 12.0 - 15.0 g/dL 11.2   11.5   10.9    ?Hematocrit 36.0 - 46.0 % 33.2   34.5   31.6    ?Platelets 150 - 400 K/uL 212   209   185    ? ? ?. ? ?  Latest Ref Rng & Units 04/24/2021  ? 10:27 AM 02/23/2021  ? 10:12 AM 12/24/2020  ? 10:03 AM  ?CMP  ?Glucose 70 - 99 mg/dL 88   79   79    ?BUN 8 - 23 mg/dL '18   26   17    '$ ?Creatinine 0.44 - 1.00 mg/dL 0.84   0.95   0.94    ?Sodium 135 - 145 mmol/L 139   137   142    ?Potassium 3.5 - 5.1 mmol/L 3.7   4.1   3.8    ?Chloride 98 - 111 mmol/L 105   102   107    ?CO2 22 - 32 mmol/L '27   27   26    '$ ?Calcium 8.9 - 10.3 mg/dL 9.6   10.0   9.4    ?Total Protein 6.5 - 8.1 g/dL 6.8   6.7   6.6    ?Total Bilirubin 0.3 - 1.2 mg/dL 0.8   0.5   0.5    ?Alkaline Phos 38 - 126 U/L 65   64   64    ?AST 15 - 41 U/L '20   22   22    '$ ?ALT 0 - 44 U/L '11   15   15    '$ ? ?. ?Lab Results  ?Component Value Date  ? LDH 190 04/24/2021  ? ?RADIOGRAPHIC STUDIES: ?I have personally reviewed the radiological images as listed and agreed with the findings in the report. ?No results found. ? ? ?ASSESSMENT & PLAN:  ? ?76 yo with  ? ?1) Stage III/IV Mantle cell lymphoma ?01/11/2020 PET/CT ([id number]) revealed no visible disease, continued resolution. ?2) s/p hypersensitivity  reaction to Rapid Rituxan. - rash and low grade fever -  resolved with solumedrol/tylenol, benadryl and famotidine  ? ?PLAN: ?-I discussed lab  results done with the patient today.  CBC stable, CMP within normal limits, LDH within normal limits. ?Patient has no lab or clinical evidence of mantle cell lymphoma progression at this time.[name] Kitchen ?No significant toxicities from her last cycle of Rituxan. ?Patient is appropriate to continue maintenance Rituxan every 60 days with current supportive medications. ?-Rituxan orders placed and signed. ?-Continue taking Claritin 10 mg p.o. daily and Pepcid 20 mg p.o. twice daily for 3 to 5 days after each dose of Rituxan to reduce delayed hypersensitivity reactions. ?-Continue vitamin B12 p.o. daily. ?Continue vitamin D 2000 units daily ? ?FOLLOW UP: ?Schedule next 2 cycles of maintenance Rituxan every 2 months with labs and MD visits ? ?The total time spent in the appointment was 25 minutes*. ? ?All of the patient's questions were answered with apparent satisfaction. The patient knows to call the clinic with any problems, questions or concerns. ? ? ?Sullivan Lone MD [place] AAHIVMS [facility] ?Hematology/Oncology Physician ?[place] ? ?.*Total Encounter Time as defined by the Centers for Medicare and Medicaid Services includes, in addition to the face-to-face time of a patient visit (documented in the note above) non-face-to-face time: obtaining and reviewing outside history, ordering and reviewing medications, tests or procedures, care coordination (communications with other health care professionals or caregivers) and documentation in the medical record. ? ? ?

## 2021-05-25 ENCOUNTER — Other Ambulatory Visit: Payer: Self-pay | Admitting: Hematology

## 2021-06-10 ENCOUNTER — Ambulatory Visit (INDEPENDENT_AMBULATORY_CARE_PROVIDER_SITE_OTHER): Payer: Medicare Other | Admitting: Internal Medicine

## 2021-06-10 ENCOUNTER — Encounter: Payer: Self-pay | Admitting: Internal Medicine

## 2021-06-10 VITALS — BP 140/86 | HR 89 | Temp 98.0°F | Ht 63.0 in | Wt 131.0 lb

## 2021-06-10 DIAGNOSIS — Z0000 Encounter for general adult medical examination without abnormal findings: Secondary | ICD-10-CM

## 2021-06-10 DIAGNOSIS — I700 Atherosclerosis of aorta: Secondary | ICD-10-CM

## 2021-06-10 DIAGNOSIS — D539 Nutritional anemia, unspecified: Secondary | ICD-10-CM | POA: Diagnosis not present

## 2021-06-10 DIAGNOSIS — E785 Hyperlipidemia, unspecified: Secondary | ICD-10-CM | POA: Diagnosis not present

## 2021-06-10 DIAGNOSIS — Z0001 Encounter for general adult medical examination with abnormal findings: Secondary | ICD-10-CM

## 2021-06-10 DIAGNOSIS — Z23 Encounter for immunization: Secondary | ICD-10-CM | POA: Insufficient documentation

## 2021-06-10 LAB — LIPID PANEL
Cholesterol: 174 mg/dL (ref 0–200)
HDL: 58.5 mg/dL (ref >39.00)
LDL Cholesterol: 79 mg/dL (ref 0–99)
NonHDL: 115.62
Total CHOL/HDL Ratio: 3
Triglycerides: 183 mg/dL — ABNORMAL HIGH (ref 0.0–149.0)
VLDL: 36.6 mg/dL (ref 0.0–40.0)

## 2021-06-10 LAB — IBC + FERRITIN
Ferritin: 139.8 ng/mL (ref 10.0–291.0)
Iron: 79 ug/dL (ref 42–145)
Saturation Ratios: 23.9 % (ref 20.0–50.0)
TIBC: 330.4 ug/dL (ref 250.0–450.0)
Transferrin: 236 mg/dL (ref 212.0–360.0)

## 2021-06-10 LAB — FOLATE: Folate: >23.5 ng/mL (ref >5.9)

## 2021-06-10 LAB — VITAMIN B12: Vitamin B-12: 1383 pg/mL — ABNORMAL HIGH (ref 211–911)

## 2021-06-10 MED ORDER — ROSUVASTATIN CALCIUM 5 MG PO TABS: 5.0000 mg | ORAL_TABLET | Freq: Every day | ORAL | 1 refills | Status: DC

## 2021-06-10 MED ORDER — ASPIRIN EC 81 MG PO TBEC: 81.0000 mg | DELAYED_RELEASE_TABLET | Freq: Every day | ORAL | 1 refills | Status: DC

## 2021-06-10 MED ORDER — SHINGRIX 50 MCG/0.5ML IM SUSR: 0.5000 mL | Freq: Once | INTRAMUSCULAR | 1 refills | Status: AC

## 2021-06-10 NOTE — Progress Notes (Signed)
? ?Subjective:  ?Patient ID: Brooke [name], female    DOB: Oct 22, 1945  Age: 76 y.o. MRN: [id number] ? ?CC: Annual Exam and Hyperlipidemia ? ? ?HPI ?Brooke [name] presents for a CPX and f/up -  ? ?She is active and denies chest pain, shortness of breath, diaphoresis, dizziness, lightheadedness, or edema. ? ?Outpatient Medications Prior to Visit  ?Medication Sig Dispense Refill  ? acetaminophen (TYLENOL) 500 MG tablet Take 1,000 mg by mouth every 6 (six) hours as needed for moderate pain.    ? Cholecalciferol (VITAMIN D3) 5000 units CAPS Take 1 capsule by mouth daily.    ? fluticasone (FLONASE) 50 MCG/ACT nasal spray Place 1 spray into both nostrils daily as needed for allergies.    ? omeprazole (PRILOSEC) 20 MG capsule Take 20 mg by mouth every morning.    ? vitamin B-12 (CYANOCOBALAMIN) 500 MCG tablet Take 500 mcg by mouth daily.    ? cyclobenzaprine (FLEXERIL) 10 MG tablet Take 10 mg by mouth daily as needed for muscle spasms.    ? HYDROcodone-acetaminophen (NORCO/VICODIN) 5-325 MG tablet Take 1-2 tablets by mouth every 4 (four) hours as needed for moderate pain. 20 tablet 0  ? rosuvastatin (CRESTOR) 5 MG tablet Take 5 mg by mouth daily.    ? ?No facility-administered medications prior to visit.  ? ? ?ROS ?Review of Systems  ?Constitutional: Negative.  Negative for diaphoresis and fatigue.  ?HENT: Negative.    ?Eyes: Negative.   ?Respiratory:  Negative for cough, chest tightness, shortness of breath and wheezing.   ?Cardiovascular:  Negative for chest pain, palpitations and leg swelling.  ?Gastrointestinal:  Negative for abdominal pain, constipation, diarrhea, nausea and vomiting.  ?Endocrine: Negative.   ?Genitourinary: Negative.  Negative for difficulty urinating and dysuria.  ?Musculoskeletal:  Negative for arthralgias and myalgias.  ?Skin: Negative.   ?Neurological:  Negative for dizziness, weakness, light-headedness and headaches.  ?Hematological:  Negative for adenopathy. Does not  bruise/bleed easily.  ?Psychiatric/Behavioral: Negative.    ? ?Objective:  ?BP 140/86 (BP Location: Left Arm, Patient Position: Sitting, Cuff Size: Large)   Pulse 89   Temp 98 ?F (36.7 ?C) (Oral)   Ht '5\' 3"'$  (1.6 m)   Wt 131 lb (59.4 kg)   SpO2 96%   BMI 23.21 kg/m?  ? ?BP Readings from Last 3 Encounters:  ?06/10/21 140/86  ?04/24/21 138/80  ?04/24/21 137/80  ? ? ?Wt Readings from Last 3 Encounters:  ?06/10/21 131 lb (59.4 kg)  ?04/24/21 131 lb 3.2 oz (59.5 kg)  ?02/23/21 130 lb 6.4 oz (59.1 kg)  ? ? ?Physical Exam ?Vitals reviewed.  ?HENT:  ?   Nose: Nose normal.  ?   Mouth/Throat:  ?   Mouth: Mucous membranes are moist.  ?Eyes:  ?   General: No scleral icterus. ?   Conjunctiva/sclera: Conjunctivae normal.  ?Cardiovascular:  ?   Rate and Rhythm: Normal rate and regular rhythm.  ?   Heart sounds: No murmur heard. ?Pulmonary:  ?   Effort: Pulmonary effort is normal.  ?   Breath sounds: No stridor. No wheezing, rhonchi or rales.  ?Abdominal:  ?   General: Abdomen is flat.  ?   Palpations: There is no mass.  ?   Tenderness: There is no abdominal tenderness. There is no guarding.  ?   Hernia: No hernia is present.  ?Musculoskeletal:     ?   General: Normal range of motion.  ?   Cervical back: Neck supple.  ?   Right  lower leg: No edema.  ?   Left lower leg: No edema.  ?Lymphadenopathy:  ?   Cervical: No cervical adenopathy.  ?Skin: ?   General: Skin is warm and dry.  ?   Coloration: Skin is not pale.  ?Neurological:  ?   General: No focal deficit present.  ?   Mental Status: She is alert.  ?Psychiatric:     ?   Mood and Affect: Mood normal.     ?   Behavior: Behavior normal.  ? ? ?Lab Results  ?Component Value Date  ? WBC 5.6 04/24/2021  ? HGB 11.2 (L) 04/24/2021  ? HCT 33.2 (L) 04/24/2021  ? PLT 212 04/24/2021  ? GLUCOSE 88 04/24/2021  ? CHOL 174 06/10/2021  ? TRIG 183.0 (H) 06/10/2021  ? HDL 58.50 06/10/2021  ? [name] 79 06/10/2021  ? ALT 11 04/24/2021  ? AST 20 04/24/2021  ? NA 139 04/24/2021  ? K 3.7  04/24/2021  ? CL 105 04/24/2021  ? CREATININE 0.84 04/24/2021  ? BUN 18 04/24/2021  ? CO2 27 04/24/2021  ? TSH 3.35 04/28/2020  ? INR 0.9 02/19/2020  ? ? ?MM DIGITAL SCREENING BILATERAL ? ?Result Date: 07/30/2020 ?CLINICAL DATA:  Screening. EXAM: DIGITAL SCREENING BILATERAL MAMMOGRAM WITH CAD TECHNIQUE: Bilateral screening digital craniocaudal and mediolateral oblique mammograms were obtained. The images were evaluated with computer-aided detection. COMPARISON:  Previous exam(s). ACR Breast Density Category d: The breast tissue is extremely dense, which lowers the sensitivity of mammography FINDINGS: There are no findings suspicious for malignancy. IMPRESSION: No mammographic evidence of malignancy. A result letter of this screening mammogram will be mailed directly to the patient. RECOMMENDATION: Screening mammogram in one year. (Code:SM-B-01Y) BI-RADS CATEGORY  1: Negative. Electronically Signed   By: Abelardo Diesel M.D.   On: 07/30/2020 13:08  ? ? ?Assessment & Plan:  ? ?Brooke [name] was seen today for annual exam and hyperlipidemia. ? ?Diagnoses and all orders for this visit: ? ?Hyperlipidemia with target LDL less than 130- LDL goal achieved. Doing well on the statin  ?-     Lipid panel; Future ?-     Lipid panel ? ?Encounter for general adult medical examination with abnormal findings- Exam completed, labs reviewed, vaccines reviewed and updated, cancer screenings are up-to-date, patient education was given. ? ?Need for prophylactic vaccination and inoculation against varicella ?-     Zoster Vaccine Adjuvanted [facility]) injection; Inject 0.5 mLs into the muscle once for 1 dose. ? ?Atherosclerosis of aorta ([place])- Risk factor modifications addressed. ?-     aspirin EC 81 MG tablet; Take 1 tablet (81 mg total) by mouth daily. ?-     rosuvastatin (CRESTOR) 5 MG tablet; Take 1 tablet (5 mg total) by mouth daily. ? 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  This is consistent with the anemia of chronic disease. ?-      Vitamin B12; Future ?-     IBC + Ferritin; Future ?-     Folate; Future ?-     Folate ?-     IBC + Ferritin ?-     Vitamin B12 ? ? ?I have discontinued Lenn Cal. Mckiddy "Ann"'s HYDROcodone-acetaminophen and cyclobenzaprine. I have also changed her rosuvastatin. Additionally, I am having her start on Shingrix and aspirin EC. Lastly, I am having her maintain her Vitamin D3, omeprazole, fluticasone, acetaminophen, and vitamin B-12. ? ?Meds ordered this encounter  ?Medications  ? Zoster Vaccine Adjuvanted [facility]) injection  ?  Sig: Inject 0.5 mLs into the  muscle once for 1 dose.  ?  Dispense:  0.5 mL  ?  Refill:  1  ? aspirin EC 81 MG tablet  ?  Sig: Take 1 tablet (81 mg total) by mouth daily.  ?  Dispense:  90 tablet  ?  Refill:  1  ? rosuvastatin (CRESTOR) 5 MG tablet  ?  Sig: Take 1 tablet (5 mg total) by mouth daily.  ?  Dispense:  90 tablet  ?  Refill:  1  ? ? ? ?Follow-up: Return in about 6 months (around 12/11/2021). ? ?Scarlette Calico, MD ?

## 2021-06-10 NOTE — Patient Instructions (Signed)

## 2021-06-12 ENCOUNTER — Encounter: Payer: Self-pay | Admitting: Internal Medicine

## 2021-06-22 ENCOUNTER — Telehealth: Payer: Self-pay | Admitting: Hematology

## 2021-06-22 MED FILL — Dexamethasone Sodium Phosphate Inj 100 MG/10ML: INTRAMUSCULAR | Qty: 1 | Status: AC

## 2021-06-22 NOTE — Telephone Encounter (Signed)
Scheduled follow-up appointment per 3/17 los. Patient is aware. ?

## 2021-06-23 ENCOUNTER — Ambulatory Visit: Payer: Medicare Other

## 2021-06-23 ENCOUNTER — Inpatient Hospital Stay: Payer: Medicare Other

## 2021-06-23 ENCOUNTER — Other Ambulatory Visit: Payer: Medicare Other

## 2021-06-23 ENCOUNTER — Other Ambulatory Visit: Payer: Self-pay

## 2021-06-23 ENCOUNTER — Inpatient Hospital Stay (HOSPITAL_BASED_OUTPATIENT_CLINIC_OR_DEPARTMENT_OTHER): Payer: Medicare Other | Admitting: Hematology

## 2021-06-23 ENCOUNTER — Ambulatory Visit: Payer: Medicare Other | Admitting: Hematology

## 2021-06-23 ENCOUNTER — Inpatient Hospital Stay: Payer: Medicare Other | Attending: Hematology

## 2021-06-23 VITALS — BP 153/83 | HR 79 | Temp 98.2°F | Resp 17

## 2021-06-23 VITALS — BP 136/86 | HR 83 | Temp 97.0°F | Resp 18 | Wt 132.0 lb

## 2021-06-23 DIAGNOSIS — Z5112 Encounter for antineoplastic immunotherapy: Secondary | ICD-10-CM

## 2021-06-23 DIAGNOSIS — C8318 Mantle cell lymphoma, lymph nodes of multiple sites: Secondary | ICD-10-CM | POA: Diagnosis not present

## 2021-06-23 DIAGNOSIS — D702 Other drug-induced agranulocytosis: Secondary | ICD-10-CM

## 2021-06-23 DIAGNOSIS — Z7189 Other specified counseling: Secondary | ICD-10-CM

## 2021-06-23 LAB — CBC WITH DIFFERENTIAL (CANCER CENTER ONLY)
Abs Immature Granulocytes: 0.14 10*3/uL — ABNORMAL HIGH (ref 0.00–0.07)
Basophils Absolute: 0 10*3/uL (ref 0.0–0.1)
Basophils Relative: 0 %
Eosinophils Absolute: 0 10*3/uL (ref 0.0–0.5)
Eosinophils Relative: 0 %
HCT: 34.6 % — ABNORMAL LOW (ref 36.0–46.0)
Hemoglobin: 11.8 g/dL — ABNORMAL LOW (ref 12.0–15.0)
Immature Granulocytes: 2 %
Lymphocytes Relative: 9 %
Lymphs Abs: 0.8 10*3/uL (ref 0.7–4.0)
MCH: 32.9 pg (ref 26.0–34.0)
MCHC: 34.1 g/dL (ref 30.0–36.0)
MCV: 96.4 fL (ref 80.0–100.0)
Monocytes Absolute: 0.8 10*3/uL (ref 0.1–1.0)
Monocytes Relative: 10 %
Neutro Abs: 6.7 10*3/uL (ref 1.7–7.7)
Neutrophils Relative %: 79 %
Platelet Count: 212 10*3/uL (ref 150–400)
RBC: 3.59 MIL/uL — ABNORMAL LOW (ref 3.87–5.11)
RDW: 12.9 % (ref 11.5–15.5)
WBC Count: 8.6 10*3/uL (ref 4.0–10.5)
nRBC: 0 % (ref 0.0–0.2)

## 2021-06-23 LAB — CMP (CANCER CENTER ONLY)
ALT: 10 U/L (ref 0–44)
AST: 20 U/L (ref 15–41)
Albumin: 4.4 g/dL (ref 3.5–5.0)
Alkaline Phosphatase: 71 U/L (ref 38–126)
Anion gap: 7 (ref 5–15)
BUN: 21 mg/dL (ref 8–23)
CO2: 27 mmol/L (ref 22–32)
Calcium: 9.4 mg/dL (ref 8.9–10.3)
Chloride: 104 mmol/L (ref 98–111)
Creatinine: 1 mg/dL (ref 0.44–1.00)
GFR, Estimated: 59 mL/min — ABNORMAL LOW (ref >60)
Glucose, Bld: 98 mg/dL (ref 70–99)
Potassium: 3.8 mmol/L (ref 3.5–5.1)
Sodium: 138 mmol/L (ref 135–145)
Total Bilirubin: 0.6 mg/dL (ref 0.3–1.2)
Total Protein: 6.7 g/dL (ref 6.5–8.1)

## 2021-06-23 LAB — LACTATE DEHYDROGENASE: LDH: 227 U/L — ABNORMAL HIGH (ref 98–192)

## 2021-06-23 MED ORDER — SODIUM CHLORIDE 0.9 % IV SOLN: Freq: Once | INTRAVENOUS | Status: AC

## 2021-06-23 MED ORDER — SODIUM CHLORIDE 0.9 % IV SOLN
375.0000 mg/m2 | Freq: Once | INTRAVENOUS | Status: AC
  Administered 2021-06-23: 600 mg via INTRAVENOUS
  Filled 2021-06-23: qty 50

## 2021-06-23 MED ORDER — ACETAMINOPHEN 325 MG PO TABS
650.0000 mg | ORAL_TABLET | Freq: Once | ORAL | Status: AC
  Administered 2021-06-23: 650 mg via ORAL
  Filled 2021-06-23: qty 2

## 2021-06-23 MED ORDER — FAMOTIDINE IN NACL 20-0.9 MG/50ML-% IV SOLN
20.0000 mg | Freq: Once | INTRAVENOUS | Status: AC
  Administered 2021-06-23: 20 mg via INTRAVENOUS
  Filled 2021-06-23: qty 50

## 2021-06-23 MED ORDER — MONTELUKAST SODIUM 10 MG PO TABS
10.0000 mg | ORAL_TABLET | Freq: Once | ORAL | Status: AC
  Administered 2021-06-23: 10 mg via ORAL
  Filled 2021-06-23: qty 1

## 2021-06-23 MED ORDER — DIPHENHYDRAMINE HCL 25 MG PO CAPS
50.0000 mg | ORAL_CAPSULE | Freq: Once | ORAL | Status: AC
  Administered 2021-06-23: 50 mg via ORAL
  Filled 2021-06-23: qty 2

## 2021-06-23 MED ORDER — SODIUM CHLORIDE 0.9 % IV SOLN
10.0000 mg | Freq: Once | INTRAVENOUS | Status: AC
  Administered 2021-06-23: 10 mg via INTRAVENOUS
  Filled 2021-06-23: qty 10

## 2021-06-23 NOTE — Patient Instructions (Signed)
[facility] MEDICAL [facility]COLOG[facility]   Discharge Instructions: Thank you for choosing [facility] to provide your oncology and hematology care.   If you have a lab appointment with the Cancer Center, please go directly to the Cancer Center and check in at the registration area.   Wear comfortable clothing and clothing appropriate for easy access to any Portacath or PICC line.   We strive to give you quality time with your provider. You may need to reschedule your appointment if you arrive late (15 or more minutes).  Arriving late affects you and other patients whose appointments are after yours.  Also, if you miss three or more appointments without notifying the office, you may be dismissed from the clinic at the provider's discretion.      For prescription refill requests, have your pharmacy contact our office and allow 72 hours for refills to be completed.    Today you received the following chemotherapy and/or immunotherapy agents: Rituximab (Rituxan)      To help prevent nausea and vomiting after your treatment, we encourage you to take your nausea medication as directed.  BELOW ARE SYMPTOMS THAT SHOULD BE REPORTED IMMEDIATELY: *FEVER GREATER THAN 100.4 F (38 C) OR HIGHER *CHILLS OR SWEATING *NAUSEA AND VOMITING THAT IS NOT CONTROLLED WITH YOUR NAUSEA MEDICATION *UNUSUAL SHORTNESS OF BREATH *UNUSUAL BRUISING OR BLEEDING *URINARY PROBLEMS (pain or burning when urinating, or frequent urination) *BOWEL PROBLEMS (unusual diarrhea, constipation, pain near the anus) TENDERNESS IN MOUTH AND THROAT WITH OR WITHOUT PRESENCE OF ULCERS (sore throat, sores in mouth, or a toothache) UNUSUAL RASH, SWELLING OR PAIN  UNUSUAL VAGINAL DISCHARGE OR ITCHING   Items with * indicate a potential emergency and should be followed up as soon as possible or go to the Emergency Department if any problems should occur.  Please show the CHEMOTHERAPY ALERT CARD or IMMUNOTHERAPY ALERT CARD at  check-in to the Emergency Department and triage nurse.  Should you have questions after your visit or need to cancel or reschedule your appointment, please contact [facility]  Dept: [phone]  and follow the prompts.  Office hours are 8:00 a.m. to 4:30 p.m. Monday - Friday. Please note that voicemails left after 4:00 p.m. may not be returned until the following business day.  We are closed weekends and major holidays. You have access to a nurse at all times for urgent questions. Please call the main number to the clinic Dept: [phone] and follow the prompts.   For any non-urgent questions, you may also contact your provider using MyChart. We now offer e-Visits for anyone 18 and older to request care online for non-urgent symptoms. For details visit [email or web address].   Also download the MyChart app! Go to the app store, search "MyChart", open the app, select , and log in with your MyChart username and password.  Due to Covid, a mask is required upon entering the hospital/clinic. If you do not have a mask, one will be given to you upon arrival. For doctor visits, patients may have 1 support person aged 18 or older with them. For treatment visits, patients cannot have anyone with them due to current Covid guidelines and our immunocompromised population.  

## 2021-06-26 ENCOUNTER — Telehealth: Payer: Self-pay | Admitting: Hematology

## 2021-06-26 NOTE — Telephone Encounter (Signed)
Left message with follow-up appointments per 5/16 los.

## 2021-06-29 ENCOUNTER — Encounter: Payer: Self-pay | Admitting: Hematology

## 2021-06-29 NOTE — Progress Notes (Signed)
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  She notes no acute new symptoms since her last clinic visit. No new lumps or bumps.  No new fevers chills night sweats or fatigue. Good p.o. intake with some weight gain. Has been trying to stay more physically active. Labs done today were reviewed in detail with the patient and are stable. No notable toxicities from her last dose of Rituxan.  REVIEW OF SYSTEMS:   10 Point review of Systems was done is negative except as noted above.  Past Medical History:  Diagnosis Date   Allergy    Arthritis    Back pain    Complication of anesthesia    bp dropped yrs ago, recent surgeries went ok   Dysrhythmia    occ, no meds for   Gastritis    GERD (gastroesophageal reflux disease)    H. pylori infection    Headache    migraines occ   Hiatal hernia    Hx of adenomatous colonic polyps 11/30/2017   Hyperlipidemia    Hypothyroidism 1970's    hx of years ago, no meds for    mantle cell lymphoma    Pulmonary nodule    Shortness of breath dyspnea    with exertion, pt told comes from acid reflux    . Past Surgical History:  Procedure Laterality Date   APPENDECTOMY     COLONOSCOPY  12/07/2013   DILATION AND CURETTAGE OF UTERUS     LAPAROSCOPIC CHOLECYSTECTOMY SINGLE SITE WITH INTRAOPERATIVE CHOLANGIOGRAM N/A 10/28/2015   Procedure: LAPAROSCOPIC CHOLECYSTECTOMY WITH INTRAOPERATIVE CHOLANGIOGRAM;  Surgeon: Armandina Gemma, MD;  Location: [facility];  Service: General;  Laterality: N/A;   LAPAROSCOPY  yrs ago   x 2   TMJ ARTHROSCOPY     TOTAL ABDOMINAL HYSTERECTOMY     complete    . Social History   Tobacco Use   Smoking status: Never   Smokeless tobacco: Never  Vaping Use   Vaping Use: Never used  Substance Use Topics   Alcohol use: Yes    Alcohol/week: 4.0 - 5.0 standard drinks    Types: 4 - 5 Cans of beer per week    Comment: 4-5 beers weekly-occ per pt   Drug use: No    ALLERGIES:  has No Known Allergies.  MEDICATIONS:  Current Outpatient Medications  Medication Sig Dispense Refill   acetaminophen (TYLENOL) 500 MG tablet Take 1,000 mg by mouth every 6 (six) hours as needed for moderate pain.     ALPRAZolam (  XANAX) 0.5 MG tablet Take 0.5 mg by mouth 3 (three) times daily.     aspirin EC 81 MG tablet Take 1 tablet (81 mg total) by mouth daily. 90 tablet 1   Cholecalciferol (VITAMIN D3) 5000 units CAPS Take 1 capsule by mouth daily.     fluticasone (FLONASE) 50 MCG/ACT nasal spray Place 1 spray into both nostrils daily as needed for allergies.     HYDROcodone-acetaminophen (NORCO) 10-325 MG tablet hydrocodone 10 mg-acetaminophen 325 mg tablet  TAKE 1 TABLET BY MOUTH UP TO 4 TIMES PER DAY AS NEEDED FOR PAIN     omeprazole (PRILOSEC) 20 MG capsule Take 20 mg by mouth every morning.     rosuvastatin (CRESTOR) 5 MG tablet Take 1 tablet (5 mg total) by mouth daily. 90 tablet 1   vitamin B-12 (CYANOCOBALAMIN) 500 MCG  tablet Take 500 mcg by mouth daily.     No current facility-administered medications for this visit.    PHYSICAL EXAMINATION: .BP 136/86   Pulse 83   Temp (!) 97 F (36.1 C)   Resp 18   Wt 132 lb (59.9 kg)   SpO2 99%   BMI 23.38 kg/m  NAD GENERAL:alert, in no acute distress and comfortable SKIN: no acute rashes, no significant lesions EYES: conjunctiva are pink and non-injected, sclera anicteric OROPHARYNX: MMM, no exudates, no oropharyngeal erythema or ulceration NECK: supple, no JVD LYMPH:  no palpable lymphadenopathy in the cervical, axillary or inguinal regions LUNGS: clear to auscultation b/l with normal respiratory effort HEART: regular rate & rhythm ABDOMEN:  normoactive bowel sounds , non tender, not distended. Extremity: no pedal edema PSYCH: alert & oriented x 3 with fluent speech NEURO: no focal motor/sensory deficits   LABORATORY DATA:   I have reviewed the data as listed      Latest Ref Rng & Units 06/23/2021   12:09 PM 04/24/2021   10:27 AM 02/23/2021   10:12 AM  CBC  WBC 4.0 - 10.5 K/uL 8.6   5.6   5.9    Hemoglobin 12.0 - 15.0 g/dL 11.8   11.2   11.5    Hematocrit 36.0 - 46.0 % 34.6   33.2   34.5    Platelets 150 - 400 K/uL 212   212   209      .    Latest Ref Rng & Units 06/23/2021   12:09 PM 04/24/2021   10:27 AM 02/23/2021   10:12 AM  CMP  Glucose 70 - 99 mg/dL 98   88   79    BUN 8 - 23 mg/dL '21   18   26    '$ Creatinine 0.44 - 1.00 mg/dL 1.00   0.84   0.95    Sodium 135 - 145 mmol/L 138   139   137    Potassium 3.5 - 5.1 mmol/L 3.8   3.7   4.1    Chloride 98 - 111 mmol/L 104   105   102    CO2 22 - 32 mmol/L '27   27   27    '$ Calcium 8.9 - 10.3 mg/dL 9.4   9.6   10.0    Total Protein 6.5 - 8.1 g/dL 6.7   6.8   6.7    Total Bilirubin 0.3 - 1.2 mg/dL 0.6   0.8   0.5    Alkaline Phos 38 - 126 U/L 71   65   64    AST 15 - 41 U/L 20   20  22    ALT 0 - 44 U/L '10   11   15     '$ . Lab Results  Component Value Date   LDH 227 (H) 06/23/2021    RADIOGRAPHIC STUDIES: I have personally reviewed the radiological images as listed and agreed with the findings in the report. No results found.   ASSESSMENT & PLAN:   76 yo with   1) Stage III/IV Mantle cell lymphoma 01/11/2020 PET/CT ([id number]) revealed no visible disease, continued resolution. 2) s/p hypersensitivity reaction to Rapid Rituxan. - rash and low grade fever -  resolved with solumedrol/tylenol, benadryl and famotidine   PLAN: -I discussed patient's labs with her in detail CBC and CMP stable LDH borderline elevated at 227 Patient has no clinical symptoms suggestive of mantle cell lymphoma at this time  Patient notes no notable toxicities from her last dose of Rituxan Patient notes that she would like to switch her maintenance Rituxan to every 3 months and I have changed the orders accordingly. -Continue taking Claritin 10 mg p.o. daily and Pepcid 20 mg p.o. twice daily for 3 to 5 days after each dose of Rituxan to reduce delayed hypersensitivity reactions. Continue her B12 and vitamin D replacement.  FOLLOW UP: note: -switching maintenance Rituxan to every 90 days with labs and MD visits (plz schedule next 2 treatments as per orders) -will need to move oncology appointments from 7/14 to 09/21/2021 (with change in maintenance regimen).    The total time spent in the appointment was 21 minutes*.  All of the patient's questions were answered with apparent satisfaction. The patient knows to call the clinic with any problems, questions or concerns.   Sullivan Lone MD MS AAHIVMS [facility] Hematology/Oncology Physician [facility]  .*Total Encounter Time as defined by the Centers for Medicare and Medicaid Services includes, in addition to the face-to-face time of a patient visit (documented in the note above) non-face-to-face time: obtaining and reviewing outside history, ordering and reviewing medications, tests or procedures, care coordination  (communications with other health care professionals or caregivers) and documentation in the medical record.

## 2021-07-08 ENCOUNTER — Other Ambulatory Visit: Payer: Self-pay | Admitting: Internal Medicine

## 2021-07-08 ENCOUNTER — Telehealth: Payer: Self-pay | Admitting: Internal Medicine

## 2021-07-08 DIAGNOSIS — Z1231 Encounter for screening mammogram for malignant neoplasm of breast: Secondary | ICD-10-CM

## 2021-07-08 NOTE — Telephone Encounter (Signed)
Patient called and states that her breasts have been sore.  The [facility] told her that Dr. Ronnald Ramp would need to put in an order in for her to have a mammogram.(Yearly)

## 2021-07-08 NOTE — Telephone Encounter (Signed)
Pt requesting referral for mammogram due to breast soreness. Last OV 5/3

## 2021-07-10 ENCOUNTER — Other Ambulatory Visit: Payer: Self-pay | Admitting: Internal Medicine

## 2021-07-10 DIAGNOSIS — N644 Mastodynia: Secondary | ICD-10-CM | POA: Insufficient documentation

## 2021-07-15 ENCOUNTER — Other Ambulatory Visit: Payer: Self-pay | Admitting: Internal Medicine

## 2021-07-15 ENCOUNTER — Other Ambulatory Visit: Payer: Self-pay | Admitting: Oncology

## 2021-07-15 DIAGNOSIS — N644 Mastodynia: Secondary | ICD-10-CM

## 2021-07-21 ENCOUNTER — Other Ambulatory Visit: Payer: Self-pay | Admitting: Internal Medicine

## 2021-08-04 ENCOUNTER — Ambulatory Visit: Payer: Medicare Other

## 2021-08-04 ENCOUNTER — Ambulatory Visit
Admission: RE | Admit: 2021-08-04 | Discharge: 2021-08-04 | Disposition: A | Discharge status: Home / Self Care | Payer: Medicare Other | Source: Ambulatory Visit | Attending: Internal Medicine | Admitting: Internal Medicine

## 2021-08-04 DIAGNOSIS — R922 Inconclusive mammogram: Secondary | ICD-10-CM | POA: Diagnosis not present

## 2021-08-04 DIAGNOSIS — N644 Mastodynia: Secondary | ICD-10-CM

## 2021-08-04 DIAGNOSIS — H5203 Hypermetropia, bilateral: Secondary | ICD-10-CM | POA: Diagnosis not present

## 2021-08-21 ENCOUNTER — Ambulatory Visit: Payer: Medicare Other

## 2021-08-21 ENCOUNTER — Other Ambulatory Visit: Payer: Medicare Other

## 2021-08-21 ENCOUNTER — Ambulatory Visit: Payer: Medicare Other | Admitting: Hematology

## 2021-08-31 ENCOUNTER — Other Ambulatory Visit: Payer: Self-pay

## 2021-09-01 DIAGNOSIS — H1031 Unspecified acute conjunctivitis, right eye: Secondary | ICD-10-CM | POA: Diagnosis not present

## 2021-09-18 ENCOUNTER — Other Ambulatory Visit: Payer: Self-pay | Admitting: *Deleted

## 2021-09-18 DIAGNOSIS — C8318 Mantle cell lymphoma, lymph nodes of multiple sites: Secondary | ICD-10-CM

## 2021-09-18 MED FILL — Dexamethasone Sodium Phosphate Inj 100 MG/10ML: INTRAMUSCULAR | Qty: 1 | Status: AC

## 2021-09-21 ENCOUNTER — Other Ambulatory Visit: Payer: Self-pay

## 2021-09-21 ENCOUNTER — Inpatient Hospital Stay: Payer: Medicare Other

## 2021-09-21 ENCOUNTER — Ambulatory Visit (INDEPENDENT_AMBULATORY_CARE_PROVIDER_SITE_OTHER): Payer: Medicare Other

## 2021-09-21 ENCOUNTER — Inpatient Hospital Stay (HOSPITAL_BASED_OUTPATIENT_CLINIC_OR_DEPARTMENT_OTHER): Payer: Medicare Other | Admitting: Hematology

## 2021-09-21 ENCOUNTER — Inpatient Hospital Stay: Payer: Medicare Other | Attending: Hematology

## 2021-09-21 VITALS — BP 134/78 | HR 79 | Temp 97.5°F | Resp 18

## 2021-09-21 VITALS — BP 121/87 | HR 77 | Temp 97.7°F | Resp 20 | Wt 129.7 lb

## 2021-09-21 DIAGNOSIS — Z5112 Encounter for antineoplastic immunotherapy: Secondary | ICD-10-CM | POA: Insufficient documentation

## 2021-09-21 DIAGNOSIS — Z0000 Encounter for general adult medical examination without abnormal findings: Secondary | ICD-10-CM

## 2021-09-21 DIAGNOSIS — Z7189 Other specified counseling: Secondary | ICD-10-CM

## 2021-09-21 DIAGNOSIS — Z79899 Other long term (current) drug therapy: Secondary | ICD-10-CM | POA: Insufficient documentation

## 2021-09-21 DIAGNOSIS — Z780 Asymptomatic menopausal state: Secondary | ICD-10-CM | POA: Insufficient documentation

## 2021-09-21 DIAGNOSIS — C8318 Mantle cell lymphoma, lymph nodes of multiple sites: Secondary | ICD-10-CM | POA: Diagnosis not present

## 2021-09-21 DIAGNOSIS — D702 Other drug-induced agranulocytosis: Secondary | ICD-10-CM

## 2021-09-21 LAB — CBC WITH DIFFERENTIAL (CANCER CENTER ONLY)
Abs Immature Granulocytes: 0.06 10*3/uL (ref 0.00–0.07)
Basophils Absolute: 0 10*3/uL (ref 0.0–0.1)
Basophils Relative: 1 %
Eosinophils Absolute: 0.1 10*3/uL (ref 0.0–0.5)
Eosinophils Relative: 1 %
HCT: 33.8 % — ABNORMAL LOW (ref 36.0–46.0)
Hemoglobin: 11.7 g/dL — ABNORMAL LOW (ref 12.0–15.0)
Immature Granulocytes: 1 %
Lymphocytes Relative: 14 %
Lymphs Abs: 1 10*3/uL (ref 0.7–4.0)
MCH: 33 pg (ref 26.0–34.0)
MCHC: 34.6 g/dL (ref 30.0–36.0)
MCV: 95.2 fL (ref 80.0–100.0)
Monocytes Absolute: 0.7 10*3/uL (ref 0.1–1.0)
Monocytes Relative: 11 %
Neutro Abs: 5.1 10*3/uL (ref 1.7–7.7)
Neutrophils Relative %: 72 %
Platelet Count: 220 10*3/uL (ref 150–400)
RBC: 3.55 MIL/uL — ABNORMAL LOW (ref 3.87–5.11)
RDW: 12.9 % (ref 11.5–15.5)
WBC Count: 6.9 10*3/uL (ref 4.0–10.5)
nRBC: 0 % (ref 0.0–0.2)

## 2021-09-21 LAB — CMP (CANCER CENTER ONLY)
ALT: 13 U/L (ref 0–44)
AST: 21 U/L (ref 15–41)
Albumin: 4.7 g/dL (ref 3.5–5.0)
Alkaline Phosphatase: 75 U/L (ref 38–126)
Anion gap: 6 (ref 5–15)
BUN: 20 mg/dL (ref 8–23)
CO2: 30 mmol/L (ref 22–32)
Calcium: 10 mg/dL (ref 8.9–10.3)
Chloride: 103 mmol/L (ref 98–111)
Creatinine: 0.91 mg/dL (ref 0.44–1.00)
GFR, Estimated: >60 mL/min (ref >60)
Glucose, Bld: 95 mg/dL (ref 70–99)
Potassium: 3.7 mmol/L (ref 3.5–5.1)
Sodium: 139 mmol/L (ref 135–145)
Total Bilirubin: 0.7 mg/dL (ref 0.3–1.2)
Total Protein: 6.8 g/dL (ref 6.5–8.1)

## 2021-09-21 LAB — LACTATE DEHYDROGENASE: LDH: 196 U/L — ABNORMAL HIGH (ref 98–192)

## 2021-09-21 MED ORDER — DIPHENHYDRAMINE HCL 25 MG PO CAPS
50.0000 mg | ORAL_CAPSULE | Freq: Once | ORAL | Status: AC
  Administered 2021-09-21: 50 mg via ORAL

## 2021-09-21 MED ORDER — ACETAMINOPHEN 325 MG PO TABS
650.0000 mg | ORAL_TABLET | Freq: Once | ORAL | Status: AC
  Administered 2021-09-21: 650 mg via ORAL

## 2021-09-21 MED ORDER — MONTELUKAST SODIUM 10 MG PO TABS
10.0000 mg | ORAL_TABLET | Freq: Once | ORAL | Status: AC
  Administered 2021-09-21: 10 mg via ORAL

## 2021-09-21 MED ORDER — FAMOTIDINE IN NACL 20-0.9 MG/50ML-% IV SOLN
20.0000 mg | Freq: Once | INTRAVENOUS | Status: AC
  Administered 2021-09-21: 20 mg via INTRAVENOUS

## 2021-09-21 MED ORDER — SODIUM CHLORIDE 0.9 % IV SOLN
10.0000 mg | Freq: Once | INTRAVENOUS | Status: AC
  Administered 2021-09-21: 10 mg via INTRAVENOUS
  Filled 2021-09-21: qty 10

## 2021-09-21 MED ORDER — SODIUM CHLORIDE 0.9 % IV SOLN
375.0000 mg/m2 | Freq: Once | INTRAVENOUS | Status: AC
  Administered 2021-09-21: 100 mg via INTRAVENOUS
  Filled 2021-09-21: qty 50

## 2021-09-21 MED ORDER — SODIUM CHLORIDE 0.9 % IV SOLN: Freq: Once | INTRAVENOUS | Status: AC

## 2021-09-21 NOTE — Patient Instructions (Signed)
Please schedule your next Medicare Wellness Visit with your Nurse Health Advisor in 1 year by calling [phone]. 

## 2021-09-21 NOTE — Progress Notes (Signed)
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 She reports She is doing well with no new symptoms or concerns.  She has been remaining physically active.  She notes no acute new symptoms since her last clinic visit. No new lumps or bumps.   No new fevers chills night sweats or fatigue. No leg swelling. No other new or acute focal symptoms.  Good p.o. intake.  Labs done today were reviewed in detail with the patient and are stable.  No notable toxicities from her last dose of Rituxan.  REVIEW OF SYSTEMS:   10 Point review of Systems was done is negative except as noted above.  Past Medical History:  Diagnosis Date   Allergy    Arthritis    Back pain    Complication of anesthesia    bp dropped yrs ago, recent surgeries went ok   Dysrhythmia    occ, no meds for   Gastritis    GERD (gastroesophageal reflux disease)    H. pylori infection    Headache    migraines occ   Hiatal  hernia    Hx of adenomatous colonic polyps 11/30/2017   Hyperlipidemia    Hypothyroidism 1970's   hx of years ago, no meds for    mantle cell lymphoma    Pulmonary nodule    Shortness of breath dyspnea    with exertion, pt told comes from acid reflux    . Past Surgical History:  Procedure Laterality Date   APPENDECTOMY     COLONOSCOPY  12/07/2013   DILATION AND CURETTAGE OF UTERUS     LAPAROSCOPIC CHOLECYSTECTOMY SINGLE SITE WITH INTRAOPERATIVE CHOLANGIOGRAM N/A 10/28/2015   Procedure: LAPAROSCOPIC CHOLECYSTECTOMY WITH INTRAOPERATIVE CHOLANGIOGRAM;  Surgeon: Armandina Gemma, MD;  Location: [facility];  Service: General;  Laterality: N/A;   LAPAROSCOPY  yrs ago   x 2   TMJ ARTHROSCOPY     TOTAL ABDOMINAL HYSTERECTOMY     complete    . Social History   Tobacco Use   Smoking status: Never   Smokeless tobacco: Never  Vaping Use   Vaping Use: Never used  Substance Use Topics   Alcohol use: Yes    Alcohol/week: 4.0 - 5.0 standard drinks of alcohol    Types: 4 - 5 Cans of beer per week    Comment: 4-5 beers weekly-occ per pt   Drug use: No    ALLERGIES:  has No Known Allergies.  MEDICATIONS:  Current Outpatient Medications  Medication Sig Dispense Refill   acetaminophen (TYLENOL) 500 MG tablet  Take 1,000 mg by mouth every 6 (six) hours as needed for moderate pain.     ALPRAZolam (XANAX) 0.5 MG tablet Take 0.5 mg by mouth 3 (three) times daily.     aspirin EC 81 MG tablet Take 1 tablet (81 mg total) by mouth daily. 90 tablet 1   Cholecalciferol (VITAMIN D3) 5000 units CAPS Take 1 capsule by mouth daily.     fluticasone (FLONASE) 50 MCG/ACT nasal spray Place 1 spray into both nostrils daily as needed for allergies.     HYDROcodone-acetaminophen (NORCO) 10-325 MG tablet hydrocodone 10 mg-acetaminophen 325 mg tablet  TAKE 1 TABLET BY MOUTH UP TO 4 TIMES PER DAY AS NEEDED FOR PAIN     omeprazole (PRILOSEC) 20 MG capsule TAKE 1 CAPSULE BY MOUTH DAILY 30 MINUTES BEFORE MORNING MEAL 90  capsule 0   rosuvastatin (CRESTOR) 5 MG tablet Take 1 tablet (5 mg total) by mouth daily. 90 tablet 1   vitamin B-12 (CYANOCOBALAMIN) 500 MCG tablet Take 500 mcg by mouth daily.     No current facility-administered medications for this visit.    PHYSICAL EXAMINATION: .BP 121/87   Pulse 77   Temp 97.7 F (36.5 C)   Resp 20   Wt 129 lb 11.2 oz (58.8 kg)   SpO2 100%   BMI 22.98 kg/m  NAD GENERAL:alert, in no acute distress and comfortable SKIN: no acute rashes, no significant lesions EYES: conjunctiva are pink and non-injected, sclera anicteric NECK: supple, no JVD LYMPH:  no palpable lymphadenopathy in the cervical, axillary or inguinal regions LUNGS: clear to auscultation b/l with normal respiratory effort HEART: regular rate & rhythm ABDOMEN:  normoactive bowel sounds , non tender, not distended. Extremity: no pedal edema PSYCH: alert & oriented x 3 with fluent speech NEURO: no focal motor/sensory deficits  LABORATORY DATA:   I have reviewed the data as listed      Latest Ref Rng & Units 09/21/2021   11:37 AM 06/23/2021   12:09 PM 04/24/2021   10:27 AM  CBC  WBC 4.0 - 10.5 K/uL 6.9  8.6  5.6   Hemoglobin 12.0 - 15.0 g/dL 11.7  11.8  11.2   Hematocrit 36.0 - 46.0 % 33.8  34.6  33.2   Platelets 150 - 400 K/uL 220  212  212     .    Latest Ref Rng & Units 09/21/2021   11:37 AM 06/23/2021   12:09 PM 04/24/2021   10:27 AM  CMP  Glucose 70 - 99 mg/dL 95  98  88   BUN 8 - 23 mg/dL '20  21  18   '$ Creatinine 0.44 - 1.00 mg/dL 0.91  1.00  0.84   Sodium 135 - 145 mmol/L 139  138  139   Potassium 3.5 - 5.1 mmol/L 3.7  3.8  3.7   Chloride 98 - 111 mmol/L 103  104  105   CO2 22 - 32 mmol/L '30  27  27   '$ Calcium 8.9 - 10.3 mg/dL 10.0  9.4  9.6   Total Protein 6.5 - 8.1 g/dL 6.8  6.7  6.8   Total Bilirubin 0.3 - 1.2 mg/dL 0.7  0.6  0.8   Alkaline Phos 38 - 126 U/L 75  71  65   AST 15 - 41 U/L '21  20  20   '$ ALT 0 - 44 U/L '13  10  11    '$ . Lab Results  Component Value Date    LDH 196 (H) 09/21/2021  RADIOGRAPHIC STUDIES: I have personally reviewed the radiological images as listed and agreed with the findings in the report. No results found.   ASSESSMENT & PLAN:   76 yo with   1) Stage III/IV Mantle cell lymphoma 01/11/2020 PET/CT ([id number]) revealed no visible disease, continued resolution. 2) s/p hypersensitivity reaction to Rapid Rituxan. - rash and low grade fever -  resolved with solumedrol/tylenol, benadryl and famotidine   PLAN: -I discussed patient's labs with her in detail CBC and CMP stable LDH borderline elevated at 196. Patient has no clinical symptoms suggestive of mantle cell lymphoma at this time  Patient notes no notable toxicities from her last dose of Rituxan -Continue taking Claritin 10 mg p.o. daily and Pepcid 20 mg p.o. twice daily for 3 to 5 days after each dose of Rituxan to reduce delayed hypersensitivity reactions. Continue her B12 and vitamin D replacement.  FOLLOW UP: Please schedule next 2 doses of maintenance Rituxan every 90 days with labs and MD visits  The total time spent in the appointment was 25 minutes*.  All of the patient's questions were answered with apparent satisfaction. The patient knows to call the clinic with any problems, questions or concerns.   Sullivan Lone MD MS AAHIVMS [facility] Hematology/Oncology Physician [facility]  .*Total Encounter Time as defined by the Centers for Medicare and Medicaid Services includes, in addition to the face-to-face time of a patient visit (documented in the note above) non-face-to-face time: obtaining and reviewing outside history, ordering and reviewing medications, tests or procedures, care coordination (communications with other health care professionals or caregivers) and documentation in the medical record.  I, Melene Muller, am acting as scribe for Dr. Sullivan Lone, MD.  .I have reviewed the above documentation for accuracy and completeness, and I  agree with the above. Brunetta Genera MD

## 2021-09-21 NOTE — Patient Instructions (Signed)
[facility] ONCOLOGY   Discharge Instructions: Thank you for choosing [facility] to provide your oncology and hematology care.   If you have a lab appointment with the [facility], please go directly to the [facility] and check in at the registration area.   Wear comfortable clothing and clothing appropriate for easy access to any Portacath or PICC line.   We strive to give you quality time with your provider. You may need to reschedule your appointment if you arrive late (15 or more minutes).  Arriving late affects you and other patients whose appointments are after yours.  Also, if you miss three or more appointments without notifying the office, you may be dismissed from the clinic at the provider's discretion.      For prescription refill requests, have your pharmacy contact our office and allow 72 hours for refills to be completed.    Today you received the following chemotherapy and/or immunotherapy agents: Rituximab (Rituxan)      To help prevent nausea and vomiting after your treatment, we encourage you to take your nausea medication as directed.  BELOW ARE SYMPTOMS THAT SHOULD BE REPORTED IMMEDIATELY: *FEVER GREATER THAN 100.4 F (38 C) OR HIGHER *CHILLS OR SWEATING *NAUSEA AND VOMITING THAT IS NOT CONTROLLED WITH YOUR NAUSEA MEDICATION *UNUSUAL SHORTNESS OF BREATH *UNUSUAL BRUISING OR BLEEDING *URINARY PROBLEMS (pain or burning when urinating, or frequent urination) *BOWEL PROBLEMS (unusual diarrhea, constipation, pain near the anus) TENDERNESS IN MOUTH AND THROAT WITH OR WITHOUT PRESENCE OF ULCERS (sore throat, sores in mouth, or a toothache) UNUSUAL RASH, SWELLING OR PAIN  UNUSUAL VAGINAL DISCHARGE OR ITCHING   Items with * indicate a potential emergency and should be followed up as soon as possible or go to the Emergency Department if any problems should occur.  Please show the CHEMOTHERAPY ALERT CARD or IMMUNOTHERAPY ALERT CARD at  check-in to the Emergency Department and triage nurse.  Should you have questions after your visit or need to cancel or reschedule your appointment, please contact [facility]  Dept: [phone]  and follow the prompts.  Office hours are 8:00 a.m. to 4:30 p.m. Monday - Friday. Please note that voicemails left after 4:00 p.m. may not be returned until the following business day.  We are closed weekends and major holidays. You have access to a nurse at all times for urgent questions. Please call the main number to the clinic Dept: [phone] and follow the prompts.   For any non-urgent questions, you may also contact your provider using MyChart. We now offer e-Visits for anyone 100 and older to request care online for non-urgent symptoms. For details visit [email or web address].   Also download the MyChart app! Go to the app store, search "MyChart", open the app, select [facility], and log in with your MyChart username and password.  Due to Covid, a mask is required upon entering the hospital/clinic. If you do not have a mask, one will be given to you upon arrival. For doctor visits, patients may have 1 support Brooke [name] aged 75 or older with them. For treatment visits, patients cannot have anyone with them due to current Covid guidelines and our immunocompromised population.

## 2021-09-21 NOTE — Progress Notes (Signed)
Subjective:   Brooke [name] is a 76 y.o. female who presents for Medicare Annual (Subsequent) preventive examination.  I connected with Brooke [name] today by telephone and verified that I am speaking with the correct person using two identifiers. I discussed the limitations, risks, security and privacy concerns of performing an evaluation and management service by telephone and the availability of in person appointments. I also discussed with the patient that there may be a patient responsible charge related to this service. The patient expressed understanding and agreed to proceed. Location patient: home Location provider: [place] Persons participating in the visit: Brooke [name] and Jillene Bucks, [place].  Time Spent with patient on telephone encounter: 20 minutes  Review of Systems    No ROS. Medicare Wellness telephone Visit. Additional risk factors are reflected in social history. Cardiac Risk Factors include: advanced age (>66mn, >>35women)     Objective:    There were no vitals filed for this visit. There is no height or weight on file to calculate BMI.     09/21/2021   10:27 AM 10/24/2020    2:21 PM 08/22/2020    2:04 PM 03/11/2020   10:12 AM 02/20/2020    6:00 AM 01/07/2020   11:15 AM 12/31/2019    2:09 PM  Advanced Directives  Does Patient Have a Medical Advance Directive? No No No No No No No  Would patient like information on creating a medical advance directive? No - Patient declined No - Patient declined No - Patient declined No - Patient declined No - Patient declined No - Patient declined No - Patient declined    Current Medications (verified) Outpatient Encounter Medications as of 09/21/2021  Medication Sig   acetaminophen (TYLENOL) 500 MG tablet Take 1,000 mg by mouth every 6 (six) hours as needed for moderate pain.   ALPRAZolam (XANAX) 0.5 MG tablet Take 0.5 mg by mouth 3 (three) times daily.   aspirin EC 81 MG tablet Take 1 tablet (81 mg total) by mouth  daily.   Cholecalciferol (VITAMIN D3) 5000 units CAPS Take 1 capsule by mouth daily.   fluticasone (FLONASE) 50 MCG/ACT nasal spray Place 1 spray into both nostrils daily as needed for allergies.   HYDROcodone-acetaminophen (NORCO) 10-325 MG tablet hydrocodone 10 mg-acetaminophen 325 mg tablet  TAKE 1 TABLET BY MOUTH UP TO 4 TIMES PER DAY AS NEEDED FOR PAIN   omeprazole (PRILOSEC) 20 MG capsule TAKE 1 CAPSULE BY MOUTH DAILY 30 MINUTES BEFORE MORNING MEAL   rosuvastatin (CRESTOR) 5 MG tablet Take 1 tablet (5 mg total) by mouth daily.   vitamin B-12 (CYANOCOBALAMIN) 500 MCG tablet Take 500 mcg by mouth daily.   No facility-administered encounter medications on file as of 09/21/2021.    Allergies (verified) Patient has no known allergies.   History: Past Medical History:  Diagnosis Date   Allergy    Arthritis    Back pain    Complication of anesthesia    bp dropped yrs ago, recent surgeries went ok   Dysrhythmia    occ, no meds for   Gastritis    GERD (gastroesophageal reflux disease)    H. pylori infection    Headache    migraines occ   Hiatal hernia    Hx of adenomatous colonic polyps 11/30/2017   Hyperlipidemia    Hypothyroidism 1970's   hx of years ago, no meds for    mantle cell lymphoma    Pulmonary nodule    Shortness of breath dyspnea  with exertion, pt told comes from acid reflux   Past Surgical History:  Procedure Laterality Date   APPENDECTOMY     COLONOSCOPY  12/07/2013   DILATION AND CURETTAGE OF UTERUS     LAPAROSCOPIC CHOLECYSTECTOMY SINGLE SITE WITH INTRAOPERATIVE CHOLANGIOGRAM N/A 10/28/2015   Procedure: LAPAROSCOPIC CHOLECYSTECTOMY WITH INTRAOPERATIVE CHOLANGIOGRAM;  Surgeon: Armandina Gemma, MD;  Location: [facility];  Service: General;  Laterality: N/A;   LAPAROSCOPY  yrs ago   x 2   TMJ ARTHROSCOPY     TOTAL ABDOMINAL HYSTERECTOMY     complete   Family History  Problem Relation Age of Onset   Heart failure Mother    Colon cancer Father        early  42's   Ovarian cancer Sister    Colon polyps Sister    Kidney cancer Brother    Colon polyps Brother    Lung cancer Brother    Colon cancer Paternal Grandfather        unknown age of onset   Breast cancer Maternal Aunt    Breast cancer Cousin    Colon cancer Cousin    Rectal cancer Neg Hx    Stomach cancer Neg Hx    Esophageal cancer Neg Hx    Social History   Socioeconomic History   Marital status: Married    Spouse name: Not on file   Number of children: Not on file   Years of education: Not on file   Highest education level: Not on file  Occupational History   Not on file  Tobacco Use   Smoking status: Never   Smokeless tobacco: Never  Vaping Use   Vaping Use: Never used  Substance and Sexual Activity   Alcohol use: Yes    Alcohol/week: 4.0 - 5.0 standard drinks of alcohol    Types: 4 - 5 Cans of beer per week    Comment: 4-5 beers weekly-occ per pt   Drug use: No   Sexual activity: Not on file  Other Topics Concern   Not on file  Social History Narrative   Not on file   Social Determinants of Health   Financial Resource Strain: Low Risk  (09/21/2021)   Overall Financial Resource Strain (CARDIA)    Difficulty of Paying Living Expenses: Not hard at all  Food Insecurity: No Food Insecurity (09/21/2021)   Hunger Vital Sign    Worried About Running Out of Food in the Last Year: Never true    Ran Out of Food in the Last Year: Never true  Transportation Needs: No Transportation Needs (09/21/2021)   PRAPARE - Hydrologist (Medical): No    Lack of Transportation (Non-Medical): No  Physical Activity: Sufficiently Active (09/21/2021)   Exercise Vital Sign    Days of Exercise per Week: 3 days    Minutes of Exercise per Session: 60 min  Stress: No Stress Concern Present (09/21/2021)   [place]    Feeling of Stress : Only a little  Social Connections: Unknown (09/21/2021)    Social Connection and Isolation Panel [NHANES]    Frequency of Communication with Friends and Family: More than three times a week    Frequency of Social Gatherings with Friends and Family: More than three times a week    Attends Religious Services: More than 4 times per year    Active Member of [employer] or Organizations: No    Attends Archivist Meetings: Never  Marital Status: Not on file    Tobacco Counseling Counseling given: Not Answered   Clinical Intake:  Pre-visit preparation completed: Yes  Pain : No/denies pain     Nutritional Risks: None Diabetes: No  How often do you need to have someone help you when you read instructions, pamphlets, or other written materials from your doctor or pharmacy?: 1 - Never What is the last grade level you completed in school?: 12th grade  Diabetic?no  Interpreter Needed?: No  Information entered by :: Jillene Bucks, [place]   Activities of Daily Living    09/21/2021   10:27 AM  In your present state of health, do you have any difficulty performing the following activities:  Hearing? 0  Vision? 0  Difficulty concentrating or making decisions? 0  Walking or climbing stairs? 0  Dressing or bathing? 0  Doing errands, shopping? 0  Preparing Food and eating ? N  Using the Toilet? N  In the past six months, have you accidently leaked urine? N  Do you have problems with loss of bowel control? N  Managing your Medications? N  Managing your Finances? N  Housekeeping or managing your Housekeeping? N    Patient Care Team: Janith Lima, MD as PCP - General (Internal Medicine)  Indicate any recent Medical Services you may have received from other than [facility] providers in the past year (date may be approximate).     Assessment:   This is a routine wellness examination for [place].  Hearing/Vision screen Patient denied any hearing difficulty. No hearing aids. Patient does wear corrective lenses/contacts.   Dietary  issues and exercise activities discussed: Current Exercise Habits: Home exercise routine (walks a mile or mile and a half), Type of exercise: walking, Time (Minutes): 60, Frequency (Times/Week): 3, Weekly Exercise (Minutes/Week): 180, Intensity: Mild, Exercise limited by: None identified   Goals Addressed               This Visit's Progress     Patient Stated (pt-stated)        Patient states she would like to try to drink more water and continue to watch what she eats.        Depression Screen    09/21/2021   10:21 AM 06/10/2021    2:55 PM 04/28/2020    1:51 PM  PHQ 2/9 Scores  PHQ - 2 Score '1 3 1  '$ PHQ- 9 Score  7     Fall Risk    09/21/2021   10:27 AM  Fall Risk   Falls in the past year? 0  Number falls in past yr: 0  Injury with Fall? 0  Risk for fall due to : No Fall Risks  Follow up Falls evaluation completed    [place]:  Any stairs in or around the home? Yes  If so, are there any without handrails? Yes  Home free of loose throw rugs in walkways, pet beds, electrical cords, etc? Yes  Adequate lighting in your home to reduce risk of falls? Yes   ASSISTIVE DEVICES UTILIZED TO PREVENT FALLS:  Life alert? No  Use of a cane, walker or w/c? No  Grab bars in the bathroom? No  Shower chair or bench in shower? Yes  Elevated toilet seat or a handicapped toilet? No   TIMED UP AND GO:  Was the test performed? No this was a phone visit.  Length of time to ambulate 10 feet: n/a sec.   Patient stated  that she has no issues with gait or balance; does not use any assistive devices.  Cognitive Function:  Patient is cogitatively intact.      09/21/2021   10:29 AM  6CIT Screen  What Year? 0 points  What month? 0 points  What time? 0 points  Count back from 20 0 points  Months in reverse 0 points  Repeat phrase 0 points  Total Score 0 points    Immunizations Immunization History  Administered Date(s) Administered   Fluad  Quad(high Dose 65+) 02/12/2020   Influenza-Unspecified 11/08/2013, 11/11/2014   PFIZER(Purple Top)SARS-COV-2 Vaccination 03/17/2019, 04/11/2019, 02/04/2020   PNEUMOCOCCAL CONJUGATE-20 04/28/2020   Tdap 04/28/2020   Zoster Recombinat (Shingrix) 06/15/2021    TDAP status: Up to date  Flu Vaccine status: Due, Education has been provided regarding the importance of this vaccine. Advised may receive this vaccine at local pharmacy or Health Dept. Aware to provide a copy of the vaccination record if obtained from local pharmacy or Health Dept. Verbalized acceptance and understanding.  Pneumococcal vaccine status: Up to date  Covid-19 vaccine status: Completed vaccines  Qualifies for Shingles Vaccine? Yes   Zostavax completed No   Shingrix completed? 1/2 completed, due for the 2nd  Screening Tests Health Maintenance  Topic Date Due   Zoster Vaccines- Shingrix (2 of 2) 08/10/2021   COVID-19 Vaccine (4 - Pfizer risk series) 10/07/2021 (Originally 03/31/2020)   INFLUENZA VACCINE  10/09/2021 (Originally 09/08/2021)   COLONOSCOPY (Pts 45-42yr Insurance coverage will need to be confirmed)  11/15/2022   TETANUS/TDAP  04/29/2030   Pneumonia Vaccine 75 Years old  Completed   DEXA SCAN  Completed   Hepatitis C Screening  Completed   HPV VACCINES  Aged Out    Health Maintenance  Health Maintenance Due  Topic Date Due   Zoster Vaccines- Shingrix (2 of 2) 08/10/2021    Colorectal cancer screening: Type of screening: Colonoscopy. Completed 11/14/17. Repeat every 5 years  Mammogram status: No longer required due to age.  Bone Density status: Completed 08/26/15. Results reflect: Bone density results: NORMAL. Repeat every N/A years.  Lung Cancer Screening: (Low Dose CT Chest recommended if Age 75-80years, 30 pack-year currently smoking OR have quit w/in 15years.) does not qualify.   Lung Cancer Screening Referral: n/a  Additional Screening:  Hepatitis C Screening: does not qualify;  Completed 09/20/19  Vision Screening: Recommended annual ophthalmology exams for early detection of glaucoma and other disorders of the eye. Is the patient up to date with their annual eye exam?  Yes  Who is the provider or what is the name of the office in which the patient attends annual eye exams? [facility] If pt is not established with a provider, would they like to be referred to a provider to establish care? No .   Dental Screening: Recommended annual dental exams for proper oral hygiene  Community Resource Referral / Chronic Care Management: CRR required this visit?  No   CCM required this visit?  No      Plan:     I have personally reviewed and noted the following in the patient's chart:   Medical and social history Use of alcohol, tobacco or illicit drugs  Current medications and supplements including opioid prescriptions.  Functional ability and status Nutritional status Physical activity Advanced directives List of other physicians Hospitalizations, surgeries, and ER visits in previous 12 months Vitals Screenings to include cognitive, depression, and falls Referrals and appointments  In addition, I have reviewed and discussed with patient  certain preventive protocols, quality metrics, and best practice recommendations. A written personalized care plan for preventive services as well as general preventive health recommendations were provided to patient.     Rossie Muskrat, CMA   09/21/2021

## 2021-09-24 ENCOUNTER — Other Ambulatory Visit: Payer: Self-pay

## 2021-09-27 ENCOUNTER — Encounter: Payer: Self-pay | Admitting: Hematology

## 2021-10-09 ENCOUNTER — Other Ambulatory Visit: Payer: Self-pay

## 2021-12-01 ENCOUNTER — Other Ambulatory Visit: Payer: Self-pay

## 2021-12-01 ENCOUNTER — Other Ambulatory Visit: Payer: Self-pay | Admitting: Internal Medicine

## 2021-12-09 ENCOUNTER — Other Ambulatory Visit: Payer: Self-pay | Admitting: Internal Medicine

## 2021-12-09 DIAGNOSIS — I700 Atherosclerosis of aorta: Secondary | ICD-10-CM

## 2021-12-18 ENCOUNTER — Other Ambulatory Visit: Payer: Self-pay

## 2021-12-18 DIAGNOSIS — C8318 Mantle cell lymphoma, lymph nodes of multiple sites: Secondary | ICD-10-CM

## 2021-12-21 ENCOUNTER — Inpatient Hospital Stay (HOSPITAL_BASED_OUTPATIENT_CLINIC_OR_DEPARTMENT_OTHER): Payer: Medicare Other | Admitting: Hematology

## 2021-12-21 ENCOUNTER — Inpatient Hospital Stay: Payer: Medicare Other | Attending: Hematology

## 2021-12-21 ENCOUNTER — Other Ambulatory Visit: Payer: Self-pay

## 2021-12-21 ENCOUNTER — Inpatient Hospital Stay: Payer: Medicare Other

## 2021-12-21 VITALS — BP 146/83 | HR 80 | Temp 97.9°F | Resp 15 | Wt 133.8 lb

## 2021-12-21 VITALS — BP 138/80 | HR 72 | Resp 17

## 2021-12-21 DIAGNOSIS — C8318 Mantle cell lymphoma, lymph nodes of multiple sites: Secondary | ICD-10-CM | POA: Diagnosis not present

## 2021-12-21 DIAGNOSIS — Z5112 Encounter for antineoplastic immunotherapy: Secondary | ICD-10-CM | POA: Diagnosis not present

## 2021-12-21 DIAGNOSIS — D702 Other drug-induced agranulocytosis: Secondary | ICD-10-CM

## 2021-12-21 DIAGNOSIS — Z7189 Other specified counseling: Secondary | ICD-10-CM

## 2021-12-21 LAB — CMP (CANCER CENTER ONLY)
ALT: 8 U/L (ref 0–44)
AST: 16 U/L (ref 15–41)
Albumin: 4.6 g/dL (ref 3.5–5.0)
Alkaline Phosphatase: 64 U/L (ref 38–126)
Anion gap: 7 (ref 5–15)
BUN: 15 mg/dL (ref 8–23)
CO2: 27 mmol/L (ref 22–32)
Calcium: 9.7 mg/dL (ref 8.9–10.3)
Chloride: 106 mmol/L (ref 98–111)
Creatinine: 0.91 mg/dL (ref 0.44–1.00)
GFR, Estimated: >60 mL/min (ref >60)
Glucose, Bld: 85 mg/dL (ref 70–99)
Potassium: 3.7 mmol/L (ref 3.5–5.1)
Sodium: 140 mmol/L (ref 135–145)
Total Bilirubin: 0.7 mg/dL (ref 0.3–1.2)
Total Protein: 6.7 g/dL (ref 6.5–8.1)

## 2021-12-21 LAB — CBC WITH DIFFERENTIAL (CANCER CENTER ONLY)
Abs Immature Granulocytes: 0.03 10*3/uL (ref 0.00–0.07)
Basophils Absolute: 0 10*3/uL (ref 0.0–0.1)
Basophils Relative: 1 %
Eosinophils Absolute: 0.1 10*3/uL (ref 0.0–0.5)
Eosinophils Relative: 2 %
HCT: 33.1 % — ABNORMAL LOW (ref 36.0–46.0)
Hemoglobin: 11.2 g/dL — ABNORMAL LOW (ref 12.0–15.0)
Immature Granulocytes: 1 %
Lymphocytes Relative: 19 %
Lymphs Abs: 1 10*3/uL (ref 0.7–4.0)
MCH: 32.4 pg (ref 26.0–34.0)
MCHC: 33.8 g/dL (ref 30.0–36.0)
MCV: 95.7 fL (ref 80.0–100.0)
Monocytes Absolute: 0.8 10*3/uL (ref 0.1–1.0)
Monocytes Relative: 14 %
Neutro Abs: 3.7 10*3/uL (ref 1.7–7.7)
Neutrophils Relative %: 63 %
Platelet Count: 220 10*3/uL (ref 150–400)
RBC: 3.46 MIL/uL — ABNORMAL LOW (ref 3.87–5.11)
RDW: 12.9 % (ref 11.5–15.5)
WBC Count: 5.6 10*3/uL (ref 4.0–10.5)
nRBC: 0 % (ref 0.0–0.2)

## 2021-12-21 LAB — LACTATE DEHYDROGENASE: LDH: 180 U/L (ref 98–192)

## 2021-12-21 MED ORDER — SODIUM CHLORIDE 0.9 % IV SOLN
10.0000 mg | Freq: Once | INTRAVENOUS | Status: AC
  Administered 2021-12-21: 10 mg via INTRAVENOUS
  Filled 2021-12-21: qty 10

## 2021-12-21 MED ORDER — FAMOTIDINE IN NACL 20-0.9 MG/50ML-% IV SOLN
20.0000 mg | Freq: Once | INTRAVENOUS | Status: AC
  Administered 2021-12-21: 20 mg via INTRAVENOUS
  Filled 2021-12-21: qty 50

## 2021-12-21 MED ORDER — SODIUM CHLORIDE 0.9 % IV SOLN: Freq: Once | INTRAVENOUS | Status: AC

## 2021-12-21 MED ORDER — SODIUM CHLORIDE 0.9 % IV SOLN
375.0000 mg/m2 | Freq: Once | INTRAVENOUS | Status: AC
  Administered 2021-12-21: 600 mg via INTRAVENOUS
  Filled 2021-12-21: qty 50

## 2021-12-21 MED ORDER — ACETAMINOPHEN 325 MG PO TABS
650.0000 mg | ORAL_TABLET | Freq: Once | ORAL | Status: AC
  Administered 2021-12-21: 650 mg via ORAL
  Filled 2021-12-21: qty 2

## 2021-12-21 MED ORDER — DIPHENHYDRAMINE HCL 25 MG PO CAPS
50.0000 mg | ORAL_CAPSULE | Freq: Once | ORAL | Status: AC
  Administered 2021-12-21: 50 mg via ORAL
  Filled 2021-12-21: qty 2

## 2021-12-21 MED ORDER — MONTELUKAST SODIUM 10 MG PO TABS
10.0000 mg | ORAL_TABLET | Freq: Once | ORAL | Status: AC
  Administered 2021-12-21: 10 mg via ORAL
  Filled 2021-12-21: qty 1

## 2021-12-21 NOTE — Progress Notes (Signed)
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   She was last seen by me on 09/21/2021 and was doing well without any new medical concerns.   She reports she is doing well since our last visit. However, she complains of consistent lower back pain the radiates to her hip. She notes she had CT scan of her back and it showed degenerative disk. Patient has always had back pain. She is following up with her primary care for this pain.   She denies abdominal pain, fever, chills, night sweats, and leg swelling. She denies any toxicities with her treatment.   She has had her influenza vaccine and COVID-19 booster, but denies RSV vaccine.   REVIEW OF SYSTEMS:   10 Point review of Systems was done is negative except as noted above.  Past Medical History:  Diagnosis Date   Allergy    Arthritis    Back pain    Complication of anesthesia    bp dropped yrs ago, recent surgeries went ok   Dysrhythmia     occ, no meds for   Gastritis    GERD (gastroesophageal reflux disease)    H. pylori infection    Headache    migraines occ   Hiatal hernia    Hx of adenomatous colonic polyps 11/30/2017   Hyperlipidemia    Hypothyroidism 1970's   hx of years ago, no meds for    mantle cell lymphoma    Pulmonary nodule    Shortness of breath dyspnea    with exertion, pt told comes from acid reflux    . Past Surgical History:  Procedure Laterality Date   APPENDECTOMY     COLONOSCOPY  12/07/2013   DILATION AND CURETTAGE OF UTERUS     LAPAROSCOPIC CHOLECYSTECTOMY SINGLE SITE WITH INTRAOPERATIVE CHOLANGIOGRAM N/A 10/28/2015   Procedure: LAPAROSCOPIC CHOLECYSTECTOMY WITH INTRAOPERATIVE CHOLANGIOGRAM;  Surgeon: Armandina Gemma, MD;  Location: [facility];  Service: General;  Laterality: N/A;   LAPAROSCOPY  yrs ago   x 2   TMJ ARTHROSCOPY     TOTAL ABDOMINAL HYSTERECTOMY     complete    . Social History   Tobacco Use   Smoking status: Never   Smokeless tobacco: Never  Vaping Use   Vaping Use: Never used  Substance Use Topics   Alcohol use: Yes    Alcohol/week: 4.0 - 5.0 standard drinks of alcohol    Types: 4 - 5 Cans of beer per week    Comment: 4-5 beers weekly-occ per pt   Drug use: No  ALLERGIES:  has No Known Allergies.  MEDICATIONS:  Current Outpatient Medications  Medication Sig Dispense Refill   acetaminophen (TYLENOL) 500 MG tablet Take 1,000 mg by mouth every 6 (six) hours as needed for moderate pain.     ALPRAZolam (XANAX) 0.5 MG tablet Take 0.5 mg by mouth 3 (three) times daily.     ASPIRIN LOW DOSE 81 MG tablet TAKE 1 TABLET BY MOUTH EVERY DAY 90 tablet 1   Cholecalciferol (VITAMIN D3) 5000 units CAPS Take 1 capsule by mouth daily.     fluticasone (FLONASE) 50 MCG/ACT nasal spray Place 1 spray into both nostrils daily as needed for allergies.     HYDROcodone-acetaminophen (NORCO) 10-325 MG tablet hydrocodone 10 mg-acetaminophen 325 mg tablet  TAKE 1 TABLET BY MOUTH UP TO 4  TIMES PER DAY AS NEEDED FOR PAIN     omeprazole (PRILOSEC) 20 MG capsule TAKE 1 CAPSULE BY MOUTH DAILY 30 MINUTES BEFORE MORNING MEAL 90 capsule 0   rosuvastatin (CRESTOR) 5 MG tablet Take 1 tablet (5 mg total) by mouth daily. 90 tablet 1   vitamin B-12 (CYANOCOBALAMIN) 500 MCG tablet Take 500 mcg by mouth daily.     No current facility-administered medications for this visit.    PHYSICAL EXAMINATION: .BP (!) 146/83 (BP Location: Left Arm, Patient Position: Sitting) Comment: Nurse notified  Pulse 80   Temp 97.9 F (36.6 C) (Temporal)   Resp 15   Wt 133 lb 12.8 oz (60.7 kg)   SpO2 98%   BMI 23.70 kg/m  NAD GENERAL:alert, in no acute distress and comfortable SKIN: no acute rashes, no significant lesions EYES: conjunctiva are pink and non-injected, sclera anicteric NECK: supple, no JVD LYMPH:  no palpable lymphadenopathy in the cervical, axillary or inguinal regions LUNGS: clear to auscultation b/l with normal respiratory effort HEART: regular rate & rhythm ABDOMEN:  normoactive bowel sounds , non tender, not distended. Extremity: no pedal edema PSYCH: alert & oriented x 3 with fluent speech NEURO: no focal motor/sensory deficits  LABORATORY DATA:   I have reviewed the data as listed      Latest Ref Rng & Units 12/21/2021   11:41 AM 09/21/2021   11:37 AM 06/23/2021   12:09 PM  CBC  WBC 4.0 - 10.5 K/uL 5.6  6.9  8.6   Hemoglobin 12.0 - 15.0 g/dL 11.2  11.7  11.8   Hematocrit 36.0 - 46.0 % 33.1  33.8  34.6   Platelets 150 - 400 K/uL 220  220  212     .    Latest Ref Rng & Units 12/21/2021   11:41 AM 09/21/2021   11:37 AM 06/23/2021   12:09 PM  CMP  Glucose 70 - 99 mg/dL 85  95  98   BUN 8 - 23 mg/dL '15  20  21   '$ Creatinine 0.44 - 1.00 mg/dL 0.91  0.91  1.00   Sodium 135 - 145 mmol/L 140  139  138   Potassium 3.5 - 5.1 mmol/L 3.7  3.7  3.8   Chloride 98 - 111 mmol/L 106  103  104   CO2 22 - 32 mmol/L '27  30  27   '$ Calcium 8.9 - 10.3 mg/dL 9.7  10.0  9.4   Total  Protein 6.5 - 8.1 g/dL 6.7  6.8  6.7   Total Bilirubin 0.3 - 1.2 mg/dL 0.7  0.7  0.6   Alkaline Phos 38 - 126 U/L 64  75  71   AST 15 - 41  U/L '16  21  20   '$ ALT 0 - 44 U/L '8  13  10    '$ . Lab Results  Component Value Date   LDH 180 12/21/2021   RADIOGRAPHIC STUDIES: I have personally reviewed the radiological images as listed and agreed with the findings in the report. No results found.   ASSESSMENT & PLAN:   76 yo with   1) Stage III/IV Mantle cell lymphoma 01/11/2020 PET/CT ([id number]) revealed no visible disease, continued resolution. 2) s/p hypersensitivity reaction to Rapid Rituxan. - rash and low grade fever -  resolved with solumedrol/tylenol, benadryl and famotidine   PLAN: -I discussed patient's labs with her in detail. Labs showed stable CBC and CMP. -Patient has no clinical symptoms suggestive of mantle cell lymphoma at this time. Patient notes no notable toxicities from her last dose of Rituxan -Continue taking Claritin 10 mg p.o. daily and Pepcid 20 mg p.o. twice daily for 3 to 5 days after each dose of Rituxan to reduce delayed hypersensitivity reactions. -Continue her B12 and vitamin D supplements. -Recommended getting RSV vaccine.   FOLLOW UP: Please schedule next 2 doses of maintenance Rituxan every 90 days with labs and MD visits  The total time spent in the appointment was 25 minutes* .  All of the patient's questions were answered with apparent satisfaction. The patient knows to call the clinic with any problems, questions or concerns.   Zettie Cooley, am acting as a scribe for Sullivan Lone, MD. Sullivan Lone MD [place] [facility] Hematology/Oncology Physician [facility]  .*Total Encounter Time as defined by the Centers for Medicare and Medicaid Services includes, in addition to the face-to-face time of a patient visit (documented in the note above) non-face-to-face time: obtaining and reviewing outside history, ordering and reviewing  medications, tests or procedures, care coordination (communications with other health care professionals or caregivers) and documentation in the medical record.

## 2021-12-24 ENCOUNTER — Other Ambulatory Visit: Payer: Self-pay

## 2021-12-27 ENCOUNTER — Encounter: Payer: Self-pay | Admitting: Hematology

## 2021-12-29 ENCOUNTER — Other Ambulatory Visit: Payer: Self-pay

## 2021-12-30 ENCOUNTER — Encounter: Payer: Self-pay | Admitting: Internal Medicine

## 2021-12-30 ENCOUNTER — Ambulatory Visit (INDEPENDENT_AMBULATORY_CARE_PROVIDER_SITE_OTHER): Payer: Medicare Other | Admitting: Internal Medicine

## 2021-12-30 ENCOUNTER — Ambulatory Visit (INDEPENDENT_AMBULATORY_CARE_PROVIDER_SITE_OTHER): Payer: Medicare Other

## 2021-12-30 VITALS — BP 126/82 | HR 80 | Temp 98.1°F | Ht 63.0 in | Wt 130.0 lb

## 2021-12-30 DIAGNOSIS — M5442 Lumbago with sciatica, left side: Secondary | ICD-10-CM | POA: Diagnosis not present

## 2021-12-30 DIAGNOSIS — M5441 Lumbago with sciatica, right side: Secondary | ICD-10-CM

## 2021-12-30 DIAGNOSIS — M545 Low back pain, unspecified: Secondary | ICD-10-CM | POA: Diagnosis not present

## 2021-12-30 DIAGNOSIS — C8318 Mantle cell lymphoma, lymph nodes of multiple sites: Secondary | ICD-10-CM | POA: Diagnosis not present

## 2021-12-30 DIAGNOSIS — Z79891 Long term (current) use of opiate analgesic: Secondary | ICD-10-CM

## 2021-12-30 DIAGNOSIS — M5136 Other intervertebral disc degeneration, lumbar region: Secondary | ICD-10-CM

## 2021-12-30 DIAGNOSIS — H04123 Dry eye syndrome of bilateral lacrimal glands: Secondary | ICD-10-CM | POA: Diagnosis not present

## 2021-12-30 DIAGNOSIS — M51369 Other intervertebral disc degeneration, lumbar region without mention of lumbar back pain or lower extremity pain: Secondary | ICD-10-CM

## 2021-12-30 HISTORY — DX: Long term (current) use of opiate analgesic: Z79.891

## 2021-12-30 HISTORY — DX: Other intervertebral disc degeneration, lumbar region without mention of lumbar back pain or lower extremity pain: M51.369

## 2021-12-30 MED ORDER — HYDROCODONE-ACETAMINOPHEN 10-325 MG PO TABS: 1.0000 | ORAL_TABLET | Freq: Three times a day (TID) | ORAL | 0 refills | Status: AC | PRN

## 2021-12-30 MED ORDER — NALOXONE HCL 4 MG/0.1ML NA LIQD: 1.0000 | Freq: Once | NASAL | 2 refills | Status: AC

## 2021-12-30 NOTE — Progress Notes (Signed)
Subjective:  Patient ID: Brooke [name], female    DOB: 02-10-1945  Age: 76 y.o. MRN: [id number]  CC: Back Pain and Anemia   HPI Brooke [name] presents for f/up -  She has chronic low back pain but for the last 2 months the back pain has been worse.  She denies trauma or injury and tells me she is taking hydrocodone to control the pain but is about to run out of the hydrocodone.  The pain rarely radiates into her lower extremities and she denies paresthesias.  Outpatient Medications Prior to Visit  Medication Sig Dispense Refill   ALPRAZolam (XANAX) 0.5 MG tablet Take 0.5 mg by mouth 3 (three) times daily.     ASPIRIN LOW DOSE 81 MG tablet TAKE 1 TABLET BY MOUTH EVERY DAY 90 tablet 1   Cholecalciferol (VITAMIN D3) 5000 units CAPS Take 1 capsule by mouth daily.     fluticasone (FLONASE) 50 MCG/ACT nasal spray Place 1 spray into both nostrils daily as needed for allergies.     omeprazole (PRILOSEC) 20 MG capsule TAKE 1 CAPSULE BY MOUTH DAILY 30 MINUTES BEFORE MORNING MEAL 90 capsule 0   rosuvastatin (CRESTOR) 5 MG tablet Take 1 tablet (5 mg total) by mouth daily. 90 tablet 1   vitamin B-12 (CYANOCOBALAMIN) 500 MCG tablet Take 500 mcg by mouth daily.     acetaminophen (TYLENOL) 500 MG tablet Take 1,000 mg by mouth every 6 (six) hours as needed for moderate pain.     HYDROcodone-acetaminophen (NORCO) 10-325 MG tablet hydrocodone 10 mg-acetaminophen 325 mg tablet  TAKE 1 TABLET BY MOUTH UP TO 4 TIMES PER DAY AS NEEDED FOR PAIN     No facility-administered medications prior to visit.    ROS Review of Systems  Constitutional: Negative.  Negative for diaphoresis, fatigue, fever and unexpected weight change.  HENT: Negative.    Eyes: Negative.   Respiratory:  Negative for cough, shortness of breath and wheezing.   Cardiovascular:  Negative for chest pain, palpitations and leg swelling.  Gastrointestinal:  Negative for abdominal pain, constipation, diarrhea,  nausea and vomiting.  Endocrine: Negative.   Genitourinary: Negative.   Musculoskeletal:  Positive for back pain. Negative for arthralgias, joint swelling and myalgias.  Skin: Negative.   Neurological: Negative.   Hematological:  Negative for adenopathy. Does not bruise/bleed easily.  Psychiatric/Behavioral: Negative.      Objective:  BP 126/82 (BP Location: Left Arm, Patient Position: Sitting, Cuff Size: Large)   Pulse 80   Temp 98.1 F (36.7 C) (Oral)   Ht '5\' 3"'$  (1.6 m)   Wt 130 lb (59 kg)   SpO2 98%   BMI 23.03 kg/m   BP Readings from Last 3 Encounters:  12/30/21 126/82  12/21/21 138/80  12/21/21 (!) 146/83    Wt Readings from Last 3 Encounters:  12/30/21 130 lb (59 kg)  12/21/21 133 lb 12.8 oz (60.7 kg)  09/21/21 129 lb 11.2 oz (58.8 kg)    Physical Exam Vitals reviewed.  HENT:     Mouth/Throat:     Mouth: Mucous membranes are moist.  Eyes:     General: No scleral icterus.    Conjunctiva/sclera: Conjunctivae normal.  Cardiovascular:     Rate and Rhythm: Normal rate and regular rhythm.     Heart sounds: No murmur heard. Pulmonary:     Effort: Pulmonary effort is normal.     Breath sounds: No stridor. No wheezing, rhonchi or rales.  Abdominal:     General: Abdomen  is flat.     Palpations: There is no mass.     Tenderness: There is no abdominal tenderness. There is no guarding.     Hernia: No hernia is present.  Musculoskeletal:        General: Normal range of motion.     Cervical back: Normal and neck supple.     Thoracic back: Normal.     Lumbar back: No tenderness or bony tenderness. Normal range of motion. Negative right straight leg raise test and negative left straight leg raise test.     Right lower leg: No edema.     Left lower leg: No edema.  Lymphadenopathy:     Cervical: No cervical adenopathy.  Skin:    General: Skin is warm and dry.  Neurological:     General: No focal deficit present.     Mental Status: She is alert. Mental status is at  baseline.     Cranial Nerves: Cranial nerves 2-12 are intact.     Motor: Motor function is intact.     Coordination: Coordination is intact.     Deep Tendon Reflexes: Reflexes normal.     Reflex Scores:      Tricep reflexes are 1+ on the right side and 1+ on the left side.      Bicep reflexes are 1+ on the right side and 1+ on the left side.      Brachioradialis reflexes are 0 on the right side and 0 on the left side.      Patellar reflexes are 1+ on the right side and 1+ on the left side.      Achilles reflexes are 1+ on the right side and 1+ on the left side.    Lab Results  Component Value Date   WBC 5.6 12/21/2021   HGB 11.2 (L) 12/21/2021   HCT 33.1 (L) 12/21/2021   PLT 220 12/21/2021   GLUCOSE 85 12/21/2021   CHOL 174 06/10/2021   TRIG 183.0 (H) 06/10/2021   HDL 58.50 06/10/2021   LDLCALC 79 06/10/2021   ALT 8 12/21/2021   AST 16 12/21/2021   NA 140 12/21/2021   K 3.7 12/21/2021   CL 106 12/21/2021   CREATININE 0.91 12/21/2021   BUN 15 12/21/2021   CO2 27 12/21/2021   TSH 3.35 04/28/2020   INR 0.9 02/19/2020    MM DIAG BREAST TOMO BILATERAL  Result Date: 08/04/2021 CLINICAL DATA:  75 year old female presenting for evaluation of diffuse bilateral breast pain for several months. EXAM: DIGITAL DIAGNOSTIC BILATERAL MAMMOGRAM WITH TOMOSYNTHESIS AND CAD TECHNIQUE: Bilateral digital diagnostic mammography and breast tomosynthesis was performed. The images were evaluated with computer-aided detection. COMPARISON:  Previous exam(s). ACR Breast Density Category c: The breast tissue is heterogeneously dense, which may obscure small masses. FINDINGS: Right breast: No suspicious mass, distortion, or microcalcifications are identified to suggest presence of malignancy. Left breast: No suspicious mass, distortion, or microcalcifications are identified to suggest presence of malignancy. IMPRESSION: No mammographic evidence of malignancy bilaterally or other finding to explain the  patient's diffuse bilateral breast pain. RECOMMENDATION: 1. Clinical follow-up as needed for the bilateral breast pain. Breast pain is a common condition, which will often resolve on its own without intervention. It can be affected by hormonal changes, medication side effect, weight changes and fit of the bra. Pain may also be referred from other adjacent areas of the body. Breast pain may be improved by wearing adequate well-fitting support, over-the-counter topical and oral NSAID medication, low-fat diet,  and ice/heat as needed. Studies have shown an improvement in cyclic pain with use of evening primrose oil and vitamin E. 2.  Screening mammogram in one year.(Code:SM-B-01Y) I have discussed the findings and recommendations with the patient. If applicable, a reminder letter will be sent to the patient regarding the next appointment. BI-RADS CATEGORY  1: Negative. Electronically Signed   By: Audie Pinto M.D.   On: 08/04/2021 14:56   DG Lumbar Spine Complete  Result Date: 12/30/2021 CLINICAL DATA:  Low back pain for 2-3 months. EXAM: LUMBAR SPINE - COMPLETE 4+ VIEW COMPARISON:  08/29/2018 FINDINGS: Transitional lumbar anatomy with partial lumbarization of S1. Stable degenerative anterolisthesis of L5 due to advanced facet disease. The other lumbar vertebral bodies are normally aligned. No acute bony findings. No destructive bony changes. No pars defects. The visualized bony pelvis is intact. Stable aortic calcifications without definite aneurysm. IMPRESSION: 1. Stable degenerative anterolisthesis of L5 due to advanced facet disease. 2. No acute bony findings. Electronically Signed   By: Marijo Sanes M.D.   On: 12/30/2021 10:16     Assessment & Plan:   Imanii was seen today for back pain and anemia.  Diagnoses and all orders for this visit:  Acute bilateral low back pain with bilateral sciatica- Based on her symptoms, exam, and plain films she has degenerative disc disease but is neurologically  intact.  Will continue hydrocodone. 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 -     DG Lumbar Spine Complete; Future  Long-term current use of opiate analgesic -     naloxone (NARCAN) nasal spray 4 mg/0.1 mL; Place 1 spray into the nose once for 1 dose.  DDD (degenerative disc disease), lumbar -     HYDROcodone-acetaminophen (NORCO) 10-325 MG tablet; Take 1 tablet by mouth every 8 (eight) hours as needed for up to 5 days.   I have discontinued Brooke [name] "Ann"'s acetaminophen and HYDROcodone-acetaminophen. I am also having her start on HYDROcodone-acetaminophen and naloxone. Additionally, I am having her maintain her Vitamin D3, fluticasone, vitamin B-12, rosuvastatin, ALPRAZolam, omeprazole, and Aspirin Low Dose.  Meds ordered this encounter  Medications   HYDROcodone-acetaminophen (NORCO) 10-325 MG tablet    Sig: Take 1 tablet by mouth every 8 (eight) hours as needed for up to 5 days.    Dispense:  90 tablet    Refill:  0   naloxone (NARCAN) nasal spray 4 mg/0.1 mL    Sig: Place 1 spray into the nose once for 1 dose.    Dispense:  2 each    Refill:  2     Follow-up: Return in about 3 months (around 04/01/2022).  Scarlette Calico, MD

## 2021-12-30 NOTE — Patient Instructions (Signed)
Douleur dorsale chronique Chronic Back Pain Lorsqu'une douleur au dos dure plus de 3 mois, il s'agit d'une douleur dorsale chronique. Il n'est pas toujours possible de dterminer la cause Designer, industrial/product. Les causes frquentes incluent : Usure et dchirure Architect) des os, des ligaments ou des [employer]. Inflammation et raideur [employer] (arthrite). La douleur dorsale chronique est souvent accompagne de priodes de douleur plus intense (pousses). Il est souvent possible d'apprendre  grer la douleur par Devota [employer]  domicile. Noreene Larsson les instructions suivantes  domicile : Newt Minion attention  tout changement dans vos symptmes. Voici quelques conseils pour Physicist, medical douleur : Grer la douleur et la raideur     Si le prestataire de soins de sant vous le recommande, appliquez de la glace sur la zone [place]. Votre prestataire de soins de sant pourrait vous recommander d'appliquer de la glace au cours des 24  48 premires heures suivant le dbut des pousses. Pour ce faire : Colman Cater de la glace dans un sac en plastique. Placez une serviette entre votre peau et le sac. Laissez la glace sur votre peau pendant 20 minutes, 2  3 fois par [employer]. Si votre prestataire de soins de sant vous le recommande, appliquez de la [employer] la zone affecte en suivant ses instructions. Utilisez la source de Conservation officer, nature vous recommande votre prestataire de soins de sant, par exemple une compresse  chaleur humide ou un coussin chauffant. Placez une serviette entre votre peau et la source de Librarian, academic. Laissez la source de chaleur en place pendant 20  30 minutes. Retirez la source de chaleur si votre peau devient rouge vif. Cela est particulirement important si vous ne ressentez Management consultant, la chaleur ou le froid. Vous risqueriez de vous brler. Essayez de prendre un long bain chaud. Activits  vitez de vous pencher et de pratiquer toute autre activit qui pourrait  aggraver le problme. Adoptez une posture correcte lorsque vous tes debout ou assis(e) : Lorsque vous tes debout, gardez le [employer] cou droits, et les paules tendues vers l'arrire. vitez de [employer]. Lorsque vous tes assis(e), gardez [employer] et relchez vos paules. Ne votez pas vos paules et ne les tendez pas vers l'arrire. Ne restez pas assis(e) ou debout au mme endroit pendant trop longtemps. Prenez de courtes priodes de repos tout au long de la journe. Cela soulagera votre douleur. Il est gnralement prfrable de vous reposer en position couche ou debout plutt qu'en position assise. Lorsque vous vous reposez pendant de plus longues priodes, ponctuez les priodes de repos par [employer] ou un tirement d'intensit modre. Cela permettra d'viter les sensations de raideur et les douleurs. Pratiquez une activit physique rgulirement. Demandez  votre prestataire de soins de sant quelles activits sont sans danger pour vous. Ne soulevez pas de poids suprieur  4,5 kg (10 lb) ou  la limite fixe par votre prestataire de soins de sant tant qu'il ne vous y a pas autoris(e). Utilisez toujours une technique de levage approprie, comme : Flchir les genoux. [place] charge prs de [employer]. viter toute torsion. Dormez sur un matelas ferme dans une position confortable. Essayez de vous allonger sur le ct avec les genoux lgrement plis. Si vous dormez [employer] General, Therapist, occupational un oreiller sous vos genoux. Mdicaments Le traitement pourra comprendre des [employer] Corporation la douleur et l'inflammation pris par voie orale ou appliqus sur la peau, des mdicaments contre la douleur dlivrs sur ordonnance ou des  relaxants musculaires. Prenez vos mdicaments en vente libre et sur ordonnance en suivant scrupuleusement les instructions de votre prestataire de soins de sant. Demandez  votre prestataire de soins de sant si le mdicament qui vous a t prescrit : Ne  vous permet pas de conduire ou [employer]urant. Peut provoquer une constipation. Vous devrez peut-tre prendre les mesures suivantes pour prvenir ou traiter la constipation : Boire des [employer] de liquides pour garder votre urine jaune ple. Prendre des Raytheon ordonnance ou en vente [place]. Consommer des aliments riches en fibres, notamment des haricots secs, des crales compltes, des fruits et des lgumes frais. Limiter votre consommation d'aliments riches en graisses et en sucres transforms, par exemple les aliments frits ou sucrs. Instructions gnrales N'utilisez pas de Sun [employer]systems tabac ou de la nicotine, tels que les cigarettes, les cigarettes lectroniques et Musician. Si vous avez besoin d'aide pour arrter de fumer, demandez conseil  votre prestataire de soins de sant. Rendez-vous  toutes vos visites de suivi prvues par [employer] de soins de sant. C'est important. Prenez Special educational needs teacher de soins de sant si : Vous ressentez une douleur qui n'est pas soulage par le repos ou les mdicaments. Votre douleur augmente ou si vous ressentez Estée [employer]uder. Vous avez une forte fivre. Vous perdez Sports administrator. Vous avez des Retail buyer  bien vos activits habituelles. Demandez immdiatement de l'aide si : Vous ressentez [employer] ou un engourdissement dans l'une de vos jambes ou l'un de vos pieds, ou dans les deux. Vous avez du mal  contrler votre vessie ou vos intestins. Vous ressentez une douleur dorsale intense et l'un des symptmes suivants : Des nauses ou des vomissements. Une douleur abdominale. Un essoufflement ou vous vous vanouissez. Rsum La douleur dorsale chronique est une douleur au dos qui dure plus de 3 mois. Lorsque la douleur commence  revenir, appliquez de la glace sur la zone douloureuse pendant les premires 24  48 heures. Appliquez une compresse  chaleur  humide ou utilisez un coussin chauffant sur la zone [place], conformment aux instructions de votre prestataire de soins de sant. Lorsque vous vous reposez pendant de plus longues priodes, ponctuez les priodes de repos par [employer] ou un tirement d'intensit modre. Cela permettra d'viter les sensations de raideur et les douleurs. Ces conseils et renseignements ne sauraient se substituer  l'avis mdical de votre prestataire de soins de sant. Par consquent, il est primordial de parler de toutes vos proccupations avec votre prestataire de soins de sant. Document Revised: 05/21/2019 Document Reviewed: 05/21/2019 Elsevier Patient Education  [place].

## 2022-01-03 IMAGING — CT NM PET TUM IMG INITIAL (PI) SKULL BASE T - THIGH
7 series · 25 of 25 positions shown · non-contrast
Comparison: None.

CLINICAL DATA: Initial treatment strategy for mantle cell lymphoma.

EXAM:
NUCLEAR MEDICINE PET SKULL BASE TO THIGH
TECHNIQUE: 6.1 mCi F-18 FDG was injected intravenously. Full-ring PET imaging
was performed from the skull base to thigh after the radiotracer. CT
data was obtained and used for attenuation correction and anatomic
localization.
Fasting blood glucose: 96 mg/dl

[Series 3: pet sk_thigh ac · axial · 5.0mm · 4.07mm/px · z∈[-1566,-710]mm · 5 of 215 slices shown]
[im 1/215]
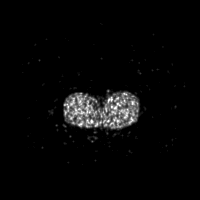
[im 54/215]
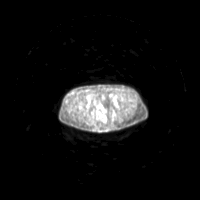
[im 108/215]
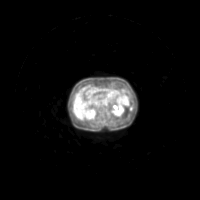
[im 161/215]
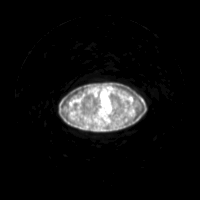
[im 215/215]
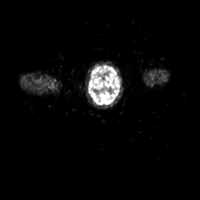

[Series 4: ct sk_thigh 5.0 b31f · axial · 5.0mm · 0.98mm/px · z∈[-1566,-710]mm · 5 of 215 slices shown]
[im 1/215]
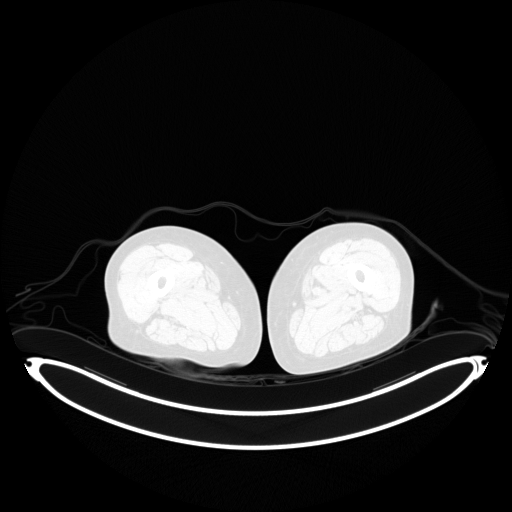
[im 54/215]
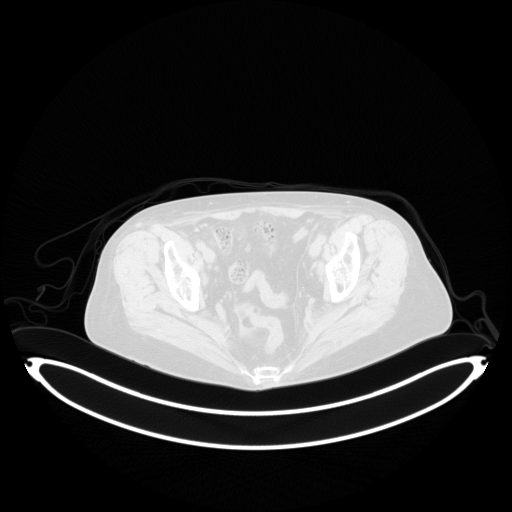
[im 108/215]
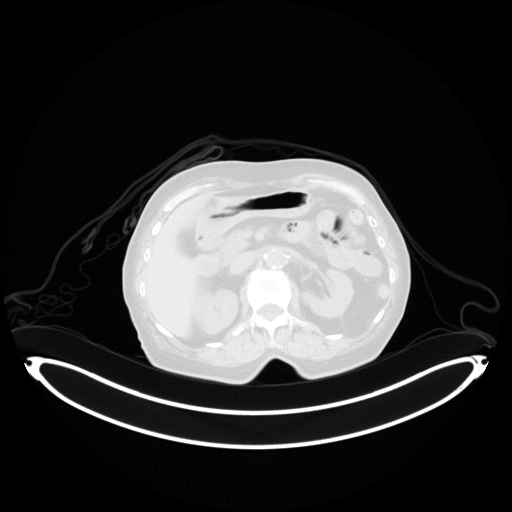
[im 161/215]
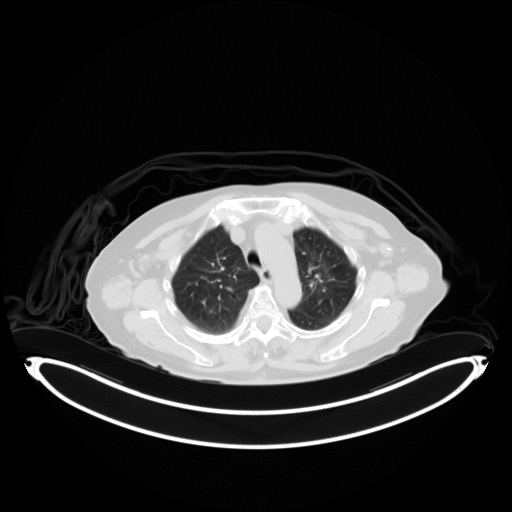
[im 215/215  brain]
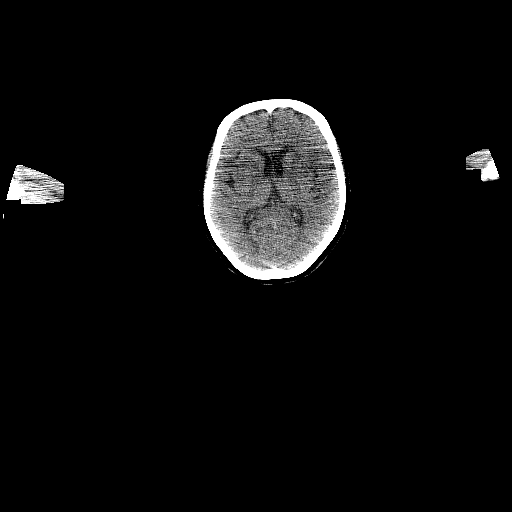

[Series 5: pet sk_thigh nac · axial · 5.0mm · 4.07mm/px · z∈[-1566,-710]mm · 5 of 215 slices shown]
[im 1/215]
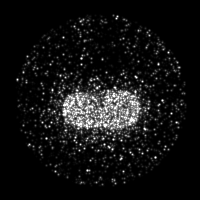
[im 54/215]
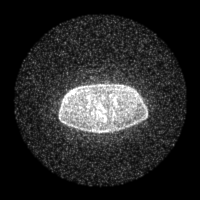
[im 108/215]
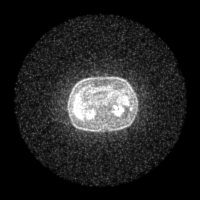
[im 161/215]
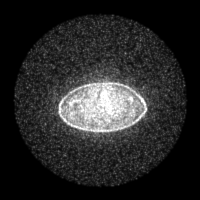
[im 215/215]
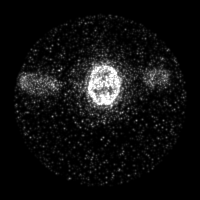

[Series 8: ct sk_thigh 5.0 b70f (id)_bone · axial · 5.0mm · 0.56mm/px · z∈[-1110,-866]mm · 2 of 62 slices shown]
[im 1/62  bone]
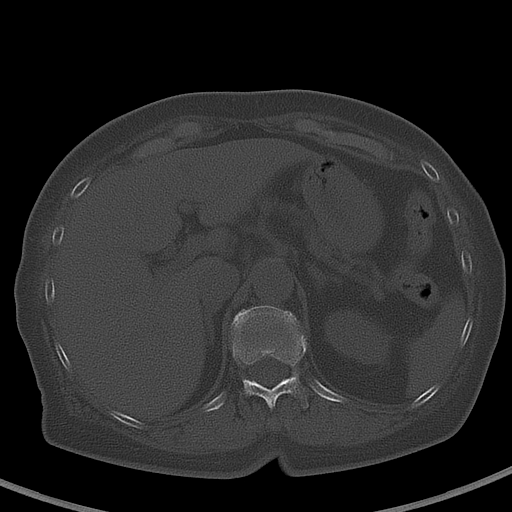
[im 62/62  bone]
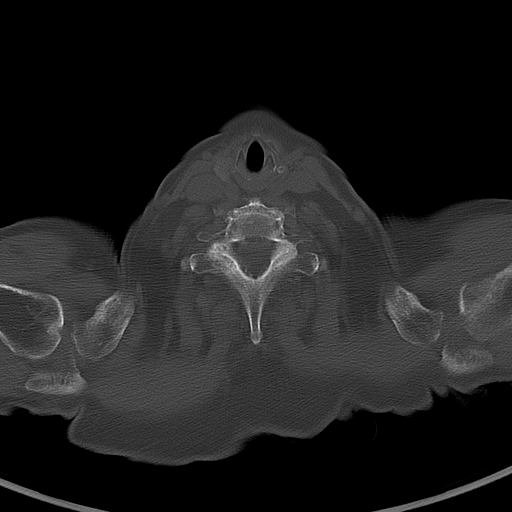

[Series 603: mip range 5 · coronal · 1.78mm/px · 1 of 32 slices shown]
[im 1/32]
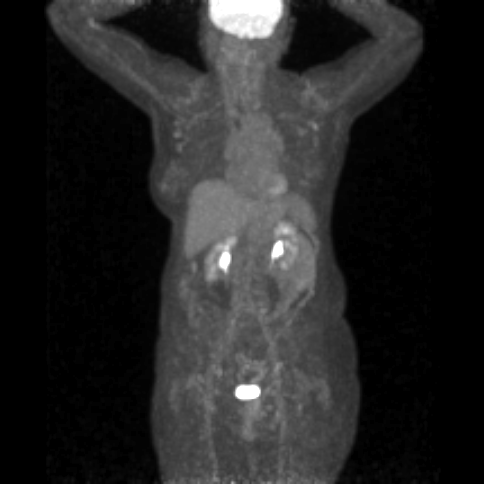

[Series 604: range-ct sk_thigh 5.0 (id)<alpha range> · 2 of 74 slices shown (1 of 2)]
[im 1/74]
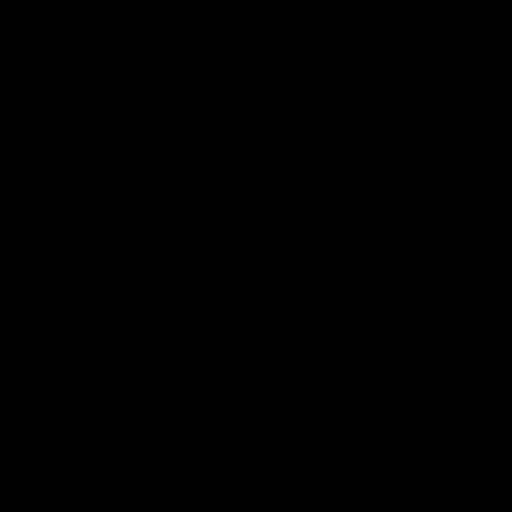
[im 74/74]
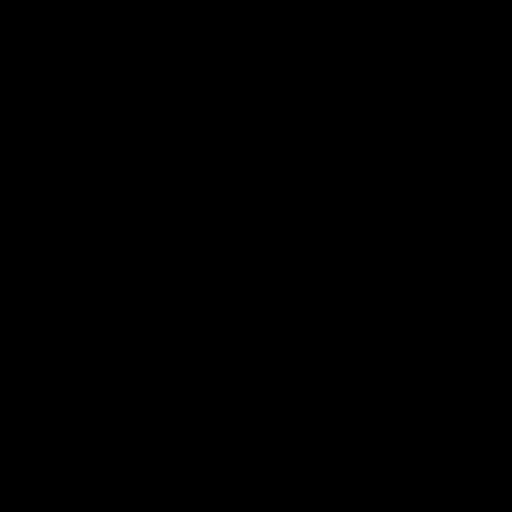

[Series 605: range-ct sk_thigh 5.0 (id)<alpha range> · 5 of 204 slices shown (2 of 2)]
[im 1/204]
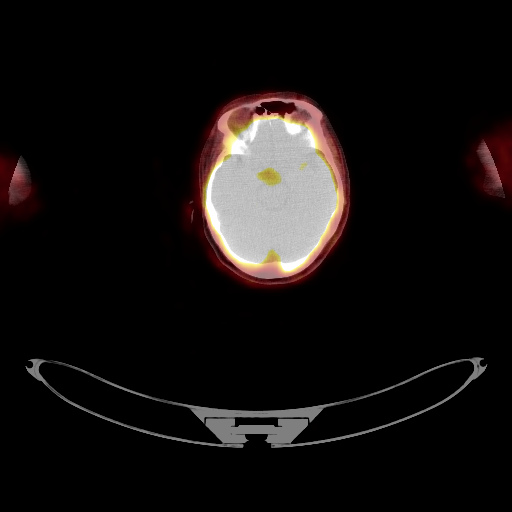
[im 51/204]
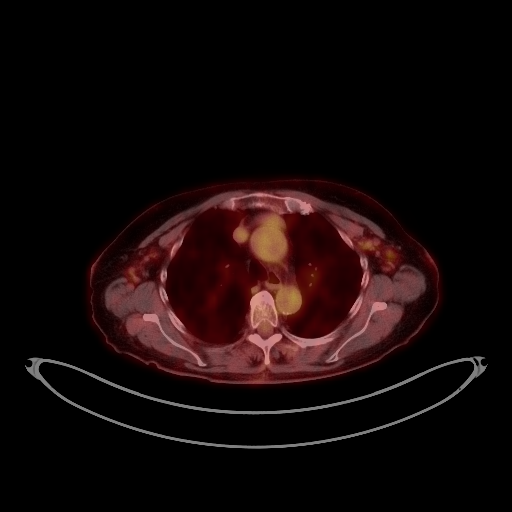
[im 102/204]
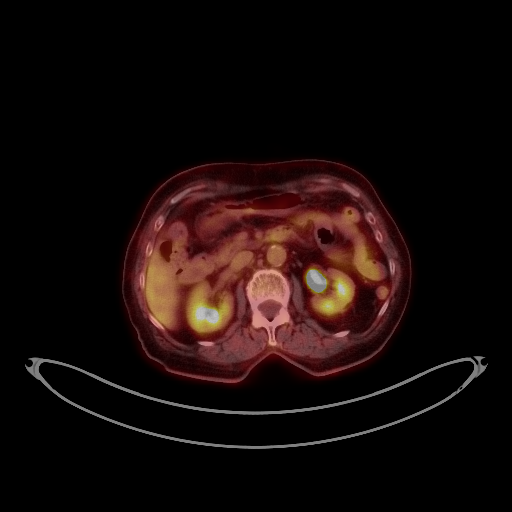
[im 153/204]
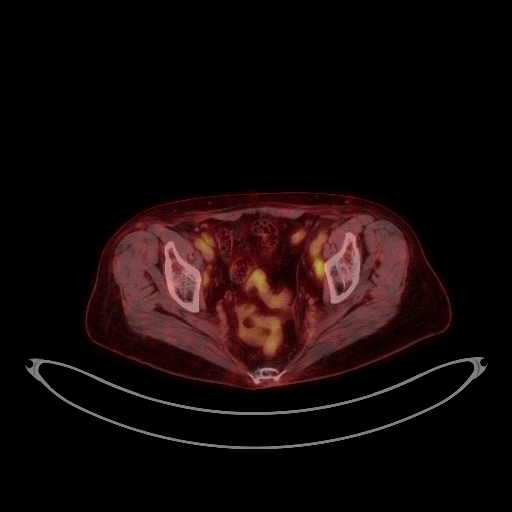
[im 204/204]
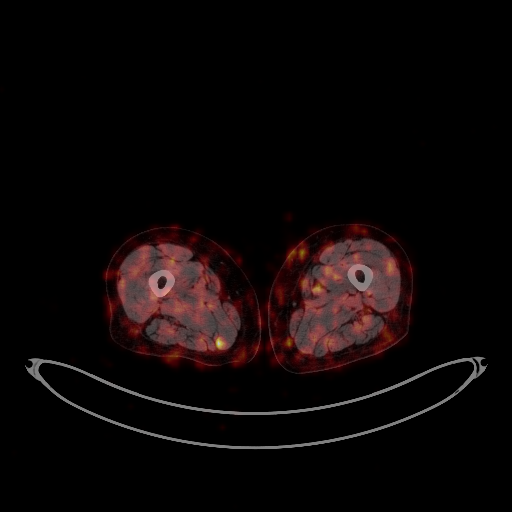

[25 of 25 positions shown; findings below may reference images not displayed]

FINDINGS: Mediastinal blood pool activity: SUV max

Liver activity: SUV max NA

NECK: Multiple small bilateral cervical nodes, including a 9 mm
short axis left level 2 node (series 4/image 29), max SUV 2.3.

Incidental CT findings: none

CHEST: Multiple bilateral axillary nodes, including an 11 mm short
axis left axillary node (series 4/image 85), max SUV 3.6. Adjacent
surgical clip, indicating this likely corresponds to the patient's
biopsy-proven mantle cell lymphoma.

No hypermetabolic mediastinal or hilar lymphadenopathy.

No suspicious pulmonary nodules.

Incidental CT findings: Atherosclerotic calcifications of the
descending thoracic aorta. Mild coronary atherosclerosis of the LAD.

ABDOMEN/PELVIS: Small retroperitoneal/pelvic lymph nodes, including:

--8 mm short axis left external iliac node (series 4/image 138), max
SUV

--Bilateral pelvic sidewall nodes measuring up to 8 mm short axis on
the left (series 4/image 164), max SUV

--Bilateral inguinal nodes measuring up to 8 mm short axis on the
left (series 4/image 175), max SUV

Spleen is normal in size, without associated hypermetabolism.

No abnormal metabolism in the liver, pancreas, or adrenal glands.

Incidental CT findings: Status post cholecystectomy. Atherosclerotic
calcifications the abdominal aorta and branch vessels. Status post
hysterectomy. Trace pelvic ascites.

SKELETON: No focal hypermetabolic activity to suggest skeletal
metastasis.

Incidental CT findings: Degenerative changes of the visualized
thoracolumbar spine.
IMPRESSION: Small cervical, bilateral axillary, retroperitoneal, and bilateral
pelvic lymph nodes, corresponding to the patient's known mantle cell
lymphoma, as above.

Spleen is normal in size.

## 2022-01-13 DIAGNOSIS — H04123 Dry eye syndrome of bilateral lacrimal glands: Secondary | ICD-10-CM | POA: Diagnosis not present

## 2022-01-25 ENCOUNTER — Other Ambulatory Visit: Payer: Self-pay

## 2022-02-20 ENCOUNTER — Encounter: Payer: Self-pay | Admitting: Hematology

## 2022-02-23 DIAGNOSIS — H25013 Cortical age-related cataract, bilateral: Secondary | ICD-10-CM | POA: Diagnosis not present

## 2022-02-23 DIAGNOSIS — H2511 Age-related nuclear cataract, right eye: Secondary | ICD-10-CM | POA: Diagnosis not present

## 2022-02-23 DIAGNOSIS — H25043 Posterior subcapsular polar age-related cataract, bilateral: Secondary | ICD-10-CM | POA: Diagnosis not present

## 2022-02-23 DIAGNOSIS — H2513 Age-related nuclear cataract, bilateral: Secondary | ICD-10-CM | POA: Diagnosis not present

## 2022-02-23 DIAGNOSIS — H18413 Arcus senilis, bilateral: Secondary | ICD-10-CM | POA: Diagnosis not present

## 2022-03-11 ENCOUNTER — Other Ambulatory Visit: Payer: Self-pay | Admitting: Internal Medicine

## 2022-03-11 DIAGNOSIS — I700 Atherosclerosis of aorta: Secondary | ICD-10-CM

## 2022-03-18 ENCOUNTER — Other Ambulatory Visit: Payer: Self-pay

## 2022-03-18 DIAGNOSIS — C8318 Mantle cell lymphoma, lymph nodes of multiple sites: Secondary | ICD-10-CM

## 2022-03-19 MED FILL — Dexamethasone Sodium Phosphate Inj 100 MG/10ML: INTRAMUSCULAR | Qty: 1 | Status: AC

## 2022-03-22 ENCOUNTER — Inpatient Hospital Stay: Payer: Medicare Other

## 2022-03-22 ENCOUNTER — Inpatient Hospital Stay: Payer: Medicare Other | Attending: Hematology

## 2022-03-22 ENCOUNTER — Inpatient Hospital Stay (HOSPITAL_BASED_OUTPATIENT_CLINIC_OR_DEPARTMENT_OTHER): Payer: Medicare Other | Admitting: Hematology

## 2022-03-22 ENCOUNTER — Other Ambulatory Visit: Payer: Self-pay

## 2022-03-22 VITALS — BP 154/88 | HR 78 | Resp 16

## 2022-03-22 VITALS — BP 151/88 | HR 82 | Temp 97.5°F | Resp 14 | Wt 135.9 lb

## 2022-03-22 DIAGNOSIS — C8318 Mantle cell lymphoma, lymph nodes of multiple sites: Secondary | ICD-10-CM | POA: Diagnosis not present

## 2022-03-22 DIAGNOSIS — Z5112 Encounter for antineoplastic immunotherapy: Secondary | ICD-10-CM | POA: Insufficient documentation

## 2022-03-22 DIAGNOSIS — Z79899 Other long term (current) drug therapy: Secondary | ICD-10-CM | POA: Insufficient documentation

## 2022-03-22 DIAGNOSIS — D702 Other drug-induced agranulocytosis: Secondary | ICD-10-CM

## 2022-03-22 DIAGNOSIS — Z7189 Other specified counseling: Secondary | ICD-10-CM

## 2022-03-22 LAB — CBC WITH DIFFERENTIAL (CANCER CENTER ONLY)
Abs Immature Granulocytes: 0.04 10*3/uL (ref 0.00–0.07)
Basophils Absolute: 0 10*3/uL (ref 0.0–0.1)
Basophils Relative: 0 %
Eosinophils Absolute: 0.1 10*3/uL (ref 0.0–0.5)
Eosinophils Relative: 1 %
HCT: 32 % — ABNORMAL LOW (ref 36.0–46.0)
Hemoglobin: 10.8 g/dL — ABNORMAL LOW (ref 12.0–15.0)
Immature Granulocytes: 1 %
Lymphocytes Relative: 14 %
Lymphs Abs: 0.9 10*3/uL (ref 0.7–4.0)
MCH: 32.5 pg (ref 26.0–34.0)
MCHC: 33.8 g/dL (ref 30.0–36.0)
MCV: 96.4 fL (ref 80.0–100.0)
Monocytes Absolute: 0.8 10*3/uL (ref 0.1–1.0)
Monocytes Relative: 12 %
Neutro Abs: 4.7 10*3/uL (ref 1.7–7.7)
Neutrophils Relative %: 72 %
Platelet Count: 205 10*3/uL (ref 150–400)
RBC: 3.32 MIL/uL — ABNORMAL LOW (ref 3.87–5.11)
RDW: 12.9 % (ref 11.5–15.5)
WBC Count: 6.5 10*3/uL (ref 4.0–10.5)
nRBC: 0 % (ref 0.0–0.2)

## 2022-03-22 LAB — CMP (CANCER CENTER ONLY)
ALT: 8 U/L (ref 0–44)
AST: 14 U/L — ABNORMAL LOW (ref 15–41)
Albumin: 4.2 g/dL (ref 3.5–5.0)
Alkaline Phosphatase: 66 U/L (ref 38–126)
Anion gap: 5 (ref 5–15)
BUN: 14 mg/dL (ref 8–23)
CO2: 29 mmol/L (ref 22–32)
Calcium: 9.3 mg/dL (ref 8.9–10.3)
Chloride: 108 mmol/L (ref 98–111)
Creatinine: 0.88 mg/dL (ref 0.44–1.00)
GFR, Estimated: >60 mL/min (ref >60)
Glucose, Bld: 95 mg/dL (ref 70–99)
Potassium: 3.8 mmol/L (ref 3.5–5.1)
Sodium: 142 mmol/L (ref 135–145)
Total Bilirubin: 0.7 mg/dL (ref 0.3–1.2)
Total Protein: 6.1 g/dL — ABNORMAL LOW (ref 6.5–8.1)

## 2022-03-22 LAB — LACTATE DEHYDROGENASE: LDH: 176 U/L (ref 98–192)

## 2022-03-22 MED ORDER — ACETAMINOPHEN 325 MG PO TABS
650.0000 mg | ORAL_TABLET | Freq: Once | ORAL | Status: AC
  Administered 2022-03-22: 650 mg via ORAL
  Filled 2022-03-22: qty 2

## 2022-03-22 MED ORDER — FAMOTIDINE IN NACL 20-0.9 MG/50ML-% IV SOLN
20.0000 mg | Freq: Once | INTRAVENOUS | Status: AC
  Administered 2022-03-22: 20 mg via INTRAVENOUS
  Filled 2022-03-22: qty 50

## 2022-03-22 MED ORDER — MONTELUKAST SODIUM 10 MG PO TABS
10.0000 mg | ORAL_TABLET | Freq: Once | ORAL | Status: AC
  Administered 2022-03-22: 10 mg via ORAL
  Filled 2022-03-22: qty 1

## 2022-03-22 MED ORDER — DIPHENHYDRAMINE HCL 25 MG PO CAPS
50.0000 mg | ORAL_CAPSULE | Freq: Once | ORAL | Status: AC
  Administered 2022-03-22: 50 mg via ORAL
  Filled 2022-03-22: qty 2

## 2022-03-22 MED ORDER — SODIUM CHLORIDE 0.9 % IV SOLN: Freq: Once | INTRAVENOUS | Status: AC

## 2022-03-22 MED ORDER — SODIUM CHLORIDE 0.9 % IV SOLN
375.0000 mg/m2 | Freq: Once | INTRAVENOUS | Status: AC
  Administered 2022-03-22: 600 mg via INTRAVENOUS
  Filled 2022-03-22: qty 50

## 2022-03-22 MED ORDER — PREDNISONE 20 MG PO TABS: 20.0000 mg | ORAL_TABLET | Freq: Every day | ORAL | 5 refills | Status: DC

## 2022-03-22 MED ORDER — SODIUM CHLORIDE 0.9 % IV SOLN
12.0000 mg | Freq: Once | INTRAVENOUS | Status: AC
  Administered 2022-03-22: 12 mg via INTRAVENOUS
  Filled 2022-03-22: qty 1.2

## 2022-03-22 NOTE — Progress Notes (Signed)
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 is here for continued evaluation and management of mantle cell lymphoma and her next dose of maintenance Rituxan therapy.   Patient was last seen by me on 12/21/2021 and she complained of consistent lower back pain which radiates to her hips. Her CT scan of back showed degenerative disk.   Patient reports she has been doing well overall without any new medical concerns since our last visit. She denies fever, chills, night sweats, unexpected weight loss, back pain, abdominal pain, chest pain, or leg swelling.   She does report she had skin rash near her right face a day after her last treatment. She notes that her right face was also swollen. She notes this side effect lasted for about 3 days. Patient took Benadryl. Her right face did feel hot to touch, but denies fever.     REVIEW OF SYSTEMS:   10 Point review of Systems was done is negative except as noted above.  Past Medical History:  Diagnosis Date   Allergy    Arthritis    Back pain    Complication of anesthesia     bp dropped yrs ago, recent surgeries went ok   Dysrhythmia    occ, no meds for   Gastritis    GERD (gastroesophageal reflux disease)    H. pylori infection    Headache    migraines occ   Hiatal hernia    Hx of adenomatous colonic polyps 11/30/2017   Hyperlipidemia    Hypothyroidism 1970's   hx of years ago, no meds for    mantle cell lymphoma    Pulmonary nodule    Shortness of breath dyspnea    with exertion, pt told comes from acid reflux    . Past Surgical History:  Procedure Laterality Date   APPENDECTOMY     COLONOSCOPY  12/07/2013   DILATION AND CURETTAGE OF UTERUS     LAPAROSCOPIC CHOLECYSTECTOMY SINGLE SITE WITH INTRAOPERATIVE CHOLANGIOGRAM N/A 10/28/2015   Procedure: LAPAROSCOPIC CHOLECYSTECTOMY WITH INTRAOPERATIVE CHOLANGIOGRAM;  Surgeon: Armandina Gemma, MD;  Location: [facility];  Service: General;  Laterality: N/A;   LAPAROSCOPY  yrs ago   x 2   TMJ ARTHROSCOPY     TOTAL ABDOMINAL HYSTERECTOMY     complete    . Social History   Tobacco Use   Smoking status: Never   Smokeless tobacco: Never  Vaping Use   Vaping Use: Never used  Substance Use Topics   Alcohol use: Yes    Alcohol/week: 4.0 - 5.0 standard drinks of alcohol    Types: 4 - 5 Cans of beer per week  Comment: 4-5 beers weekly-occ per pt   Drug use: No    ALLERGIES:  has No Known Allergies.  MEDICATIONS:  Current Outpatient Medications  Medication Sig Dispense Refill   ALPRAZolam (XANAX) 0.5 MG tablet Take 0.5 mg by mouth 3 (three) times daily.     ASPIRIN LOW DOSE 81 MG tablet TAKE 1 TABLET BY MOUTH EVERY DAY 90 tablet 1   Cholecalciferol (VITAMIN D3) 5000 units CAPS Take 1 capsule by mouth daily.     fluticasone (FLONASE) 50 MCG/ACT nasal spray Place 1 spray into both nostrils daily as needed for allergies.     omeprazole (PRILOSEC) 20 MG capsule TAKE 1 CAPSULE BY MOUTH DAILY 30 MINUTES BEFORE MORNING MEAL 90 capsule 0   rosuvastatin (CRESTOR) 5 MG tablet TAKE 1 TABLET BY MOUTH ONCE DAILY  90 tablet 1   vitamin B-12 (CYANOCOBALAMIN) 500 MCG tablet Take 500 mcg by mouth daily.     No current facility-administered medications for this visit.    PHYSICAL EXAMINATION: .There were no vitals taken for this visit. NAD GENERAL:alert, in no acute distress and comfortable SKIN: no acute rashes, no significant lesions EYES: conjunctiva are pink and non-injected, sclera anicteric NECK: supple, no JVD LYMPH:  no palpable lymphadenopathy in the cervical, axillary or inguinal regions LUNGS: clear to auscultation b/l with normal respiratory effort HEART: regular rate & rhythm ABDOMEN:  normoactive bowel sounds , non tender, not distended. Extremity: no pedal edema PSYCH: alert & oriented x 3 with fluent speech NEURO: no focal motor/sensory deficits  LABORATORY DATA:   I have reviewed the data as listed      Latest Ref Rng & Units 03/22/2022   11:14 AM 12/21/2021   11:41 AM 09/21/2021   11:37 AM  CBC  WBC 4.0 - 10.5 K/uL 6.5  5.6  6.9   Hemoglobin 12.0 - 15.0 g/dL 10.8  11.2  11.7   Hematocrit 36.0 - 46.0 % 32.0  33.1  33.8   Platelets 150 - 400 K/uL 205  220  220     .    Latest Ref Rng & Units 03/22/2022   11:14 AM 12/21/2021   11:41 AM 09/21/2021   11:37 AM  CMP  Glucose 70 - 99 mg/dL 95  85  95   BUN 8 - 23 mg/dL 14  15  20   $ Creatinine 0.44 - 1.00 mg/dL 0.88  0.91  0.91   Sodium 135 - 145 mmol/L 142  140  139   Potassium 3.5 - 5.1 mmol/L 3.8  3.7  3.7   Chloride 98 - 111 mmol/L 108  106  103   CO2 22 - 32 mmol/L 29  27  30   $ Calcium 8.9 - 10.3 mg/dL 9.3  9.7  10.0   Total Protein 6.5 - 8.1 g/dL 6.1  6.7  6.8   Total Bilirubin 0.3 - 1.2 mg/dL 0.7  0.7  0.7   Alkaline Phos 38 - 126 U/L 66  64  75   AST 15 - 41 U/L 14  16  21   $ ALT 0 - 44 U/L 8  8  13    $ . Lab Results  Component Value Date   LDH 176 03/22/2022   RADIOGRAPHIC STUDIES: I have personally reviewed the radiological images as listed and agreed with the findings in the report. No results  found.   ASSESSMENT & PLAN:   77 yo with   1) Stage III/IV Mantle cell lymphoma 01/11/2020 PET/CT ([id number]) revealed no visible  disease, continued resolution. 2) s/p hypersensitivity reaction to Rapid Rituxan. - rash and low grade fever -  resolved with solumedrol/tylenol, benadryl and famotidine   PLAN: -Discussed lab results from today, 03/22/2022, with the patient. CBC shows decreased hemoglobin of 10.8 and hematocrit of 32.0. CMP is stable.  LDH WNL -Prescribed 10 mg Claritin and recommended to start Pepcid 40 mg daily for 5 days to avoid right face swelling or skin rash after her treatment.  -Prescribed Prednisone. -Patient has no clinical symptoms suggestive of mantle cell lymphoma at this time. -Patient did have mild toxicities from her last dose of Rituxan. -Continue her B12 and vitamin D supplements.  FOLLOW UP: Per integrated scheduling  The total time spent in the appointment was 30 minutes* .  All of the patient's questions were answered with apparent satisfaction. The patient knows to call the clinic with any problems, questions or concerns.   Sullivan Lone MD MS AAHIVMS [facility] Hematology/Oncology Physician [facility]  .*Total Encounter Time as defined by the Centers for Medicare and Medicaid Services includes, in addition to the face-to-face time of a patient visit (documented in the note above) non-face-to-face time: obtaining and reviewing outside history, ordering and reviewing medications, tests or procedures, care coordination (communications with other health care professionals or caregivers) and documentation in the medical record.   I, Cleda Mccreedy, am acting as a Education administrator for Sullivan Lone, MD. .I have reviewed the above documentation for accuracy and completeness, and I agree with the above. Brunetta Genera MD

## 2022-03-22 NOTE — Patient Instructions (Signed)
[facility]  Discharge Instructions: Thank you for choosing [facility] to provide your oncology and hematology care.   If you have a lab appointment with the [facility], please go directly to the [facility] and check in at the registration area.   Wear comfortable clothing and clothing appropriate for easy access to any Portacath or PICC line.   We strive to give you quality time with your provider. You may need to reschedule your appointment if you arrive late (15 or more minutes).  Arriving late affects you and other patients whose appointments are after yours.  Also, if you miss three or more appointments without notifying the office, you may be dismissed from the clinic at the provider's discretion.      For prescription refill requests, have your pharmacy contact our office and allow 72 hours for refills to be completed.    Today you received the following chemotherapy and/or immunotherapy agents: Rituxan    To help prevent nausea and vomiting after your treatment, we encourage you to take your nausea medication as directed.  BELOW ARE SYMPTOMS THAT SHOULD BE REPORTED IMMEDIATELY: *FEVER GREATER THAN 100.4 F (38 C) OR HIGHER *CHILLS OR SWEATING *NAUSEA AND VOMITING THAT IS NOT CONTROLLED WITH YOUR NAUSEA MEDICATION *UNUSUAL SHORTNESS OF BREATH *UNUSUAL BRUISING OR BLEEDING *URINARY PROBLEMS (pain or burning when urinating, or frequent urination) *BOWEL PROBLEMS (unusual diarrhea, constipation, pain near the anus) TENDERNESS IN MOUTH AND THROAT WITH OR WITHOUT PRESENCE OF ULCERS (sore throat, sores in mouth, or a toothache) UNUSUAL RASH, SWELLING OR PAIN  UNUSUAL VAGINAL DISCHARGE OR ITCHING   Items with * indicate a potential emergency and should be followed up as soon as possible or go to the Emergency Department if any problems should occur.  Please show the CHEMOTHERAPY ALERT CARD or IMMUNOTHERAPY ALERT CARD at check-in  to the Emergency Department and triage nurse.  Should you have questions after your visit or need to cancel or reschedule your appointment, please contact [facility]  Dept: [phone]  and follow the prompts.  Office hours are 8:00 a.m. to 4:30 p.m. Monday - Friday. Please note that voicemails left after 4:00 p.m. may not be returned until the following business day.  We are closed weekends and major holidays. You have access to a nurse at all times for urgent questions. Please call the main number to the clinic Dept: [phone] and follow the prompts.   For any non-urgent questions, you may also contact your provider using MyChart. We now offer e-Visits for anyone 25 and older to request care online for non-urgent symptoms. For details visit [email or web address].   Also download the MyChart app! Go to the app store, search "MyChart", open the app, select [facility], and log in with your MyChart username and password.

## 2022-03-22 NOTE — Progress Notes (Signed)
Patient seen by MD today  Vitals are within treatment parameters./ BP 151/88  Labs reviewed: and are within treatment parameters."  Per physician team, patient is ready for treatment and there are NO modifications to the treatment plan.

## 2022-03-25 DIAGNOSIS — H04123 Dry eye syndrome of bilateral lacrimal glands: Secondary | ICD-10-CM | POA: Diagnosis not present

## 2022-03-28 ENCOUNTER — Encounter: Payer: Self-pay | Admitting: Hematology

## 2022-04-09 DIAGNOSIS — Z20822 Contact with and (suspected) exposure to covid-19: Secondary | ICD-10-CM | POA: Diagnosis not present

## 2022-04-09 DIAGNOSIS — J019 Acute sinusitis, unspecified: Secondary | ICD-10-CM | POA: Diagnosis not present

## 2022-04-22 ENCOUNTER — Encounter: Payer: Self-pay | Admitting: Internal Medicine

## 2022-04-22 ENCOUNTER — Ambulatory Visit (INDEPENDENT_AMBULATORY_CARE_PROVIDER_SITE_OTHER): Payer: Medicare Other | Admitting: Internal Medicine

## 2022-04-22 VITALS — BP 126/74 | HR 70 | Temp 98.1°F | Ht 63.0 in | Wt 132.0 lb

## 2022-04-22 DIAGNOSIS — M51369 Other intervertebral disc degeneration, lumbar region without mention of lumbar back pain or lower extremity pain: Secondary | ICD-10-CM

## 2022-04-22 DIAGNOSIS — J22 Unspecified acute lower respiratory infection: Secondary | ICD-10-CM | POA: Diagnosis not present

## 2022-04-22 DIAGNOSIS — J0111 Acute recurrent frontal sinusitis: Secondary | ICD-10-CM

## 2022-04-22 DIAGNOSIS — M5136 Other intervertebral disc degeneration, lumbar region: Secondary | ICD-10-CM

## 2022-04-22 MED ORDER — HYDROCODONE-ACETAMINOPHEN 10-325 MG PO TABS: 1.0000 | ORAL_TABLET | Freq: Three times a day (TID) | ORAL | 0 refills | Status: DC | PRN

## 2022-04-22 MED ORDER — AMOXICILLIN-POT CLAVULANATE 875-125 MG PO TABS: 1.0000 | ORAL_TABLET | Freq: Two times a day (BID) | ORAL | 0 refills | Status: AC

## 2022-04-22 MED ORDER — PROMETHAZINE-DM 6.25-15 MG/5ML PO SYRP: 5.0000 mL | ORAL_SOLUTION | Freq: Four times a day (QID) | ORAL | 0 refills | Status: AC | PRN

## 2022-04-22 NOTE — Patient Instructions (Signed)

## 2022-04-22 NOTE — Progress Notes (Signed)
Subjective:  Patient ID: Brooke [name], female    DOB: 06/20/1945  Age: 77 y.o. MRN: [id number]  CC: URI and Back Pain   HPI Brooke [name] presents for f/up ----  She complains of a 3-week history of URI symptoms with nonproductive cough, postnasal drip, sinus facial pain, epistaxis, and nausea.  She says the symptoms started with fever, chills, night sweats, and fatigue.  She tells me that she was seen at an urgent care center and her chest x-ray was normal.  She has not been treated with an antibiotic.  Outpatient Medications Prior to Visit  Medication Sig Dispense Refill   ALPRAZolam (XANAX) 0.5 MG tablet Take 0.5 mg by mouth 3 (three) times daily.     ASPIRIN LOW DOSE 81 MG tablet TAKE 1 TABLET BY MOUTH EVERY DAY 90 tablet 1   Cholecalciferol (VITAMIN D3) 5000 units CAPS Take 1 capsule by mouth daily.     fluticasone (FLONASE) 50 MCG/ACT nasal spray Place 1 spray into both nostrils daily as needed for allergies.     omeprazole (PRILOSEC) 20 MG capsule TAKE 1 CAPSULE BY MOUTH DAILY 30 MINUTES BEFORE MORNING MEAL 90 capsule 0   predniSONE (DELTASONE) 20 MG tablet Take 1 tablet (20 mg total) by mouth daily with breakfast. For 5 days after each Rituxan infusion 5 tablet 5   rosuvastatin (CRESTOR) 5 MG tablet TAKE 1 TABLET BY MOUTH ONCE DAILY 90 tablet 1   vitamin B-12 (CYANOCOBALAMIN) 500 MCG tablet Take 500 mcg by mouth daily.     HYDROcodone-acetaminophen (NORCO) 10-325 MG tablet Take 1 tablet by mouth every 8 (eight) hours as needed.     No facility-administered medications prior to visit.    ROS Review of Systems  Constitutional:  Negative for chills, fatigue and fever.  HENT:  Positive for nosebleeds, postnasal drip, rhinorrhea, sinus pressure and sinus pain. Negative for ear discharge, ear pain, facial swelling, sore throat, trouble swallowing and voice change.   Respiratory:  Positive for cough. Negative for chest tightness, shortness of breath and  wheezing.   Cardiovascular:  Negative for chest pain, palpitations and leg swelling.  Gastrointestinal:  Positive for nausea. Negative for abdominal pain, diarrhea and vomiting.  Genitourinary: Negative.  Negative for difficulty urinating.  Musculoskeletal:  Positive for back pain.  Neurological: Negative.   Hematological: Negative.  Negative for adenopathy.  Psychiatric/Behavioral: Negative.      Objective:  BP 126/74 (BP Location: Left Arm, Patient Position: Sitting, Cuff Size: Large)   Pulse 70   Temp 98.1 F (36.7 C) (Oral)   Ht 5\' 3"  (1.6 m)   Wt 132 lb (59.9 kg)   SpO2 96%   BMI 23.38 kg/m   BP Readings from Last 3 Encounters:  04/22/22 126/74  03/22/22 (!) 154/88  03/22/22 (!) 151/88    Wt Readings from Last 3 Encounters:  04/22/22 132 lb (59.9 kg)  03/22/22 135 lb 14.4 oz (61.6 kg)  12/30/21 130 lb (59 kg)    Physical Exam Vitals reviewed.  Constitutional:      General: She is not in acute distress.    Appearance: She is not ill-appearing, toxic-appearing or diaphoretic.  HENT:     Nose: Nose normal.     Mouth/Throat:     Mouth: Mucous membranes are moist.  Eyes:     Conjunctiva/sclera: Conjunctivae normal.  Cardiovascular:     Rate and Rhythm: Normal rate and regular rhythm.     Heart sounds: No murmur heard. Pulmonary:  Effort: Pulmonary effort is normal.     Breath sounds: No stridor. No wheezing, rhonchi or rales.  Abdominal:     General: Abdomen is flat.     Palpations: There is no mass.     Tenderness: There is no abdominal tenderness. There is no guarding.     Hernia: No hernia is present.  Musculoskeletal:        General: Normal range of motion.     Right lower leg: No edema.     Left lower leg: No edema.  Skin:    General: Skin is warm and dry.  Neurological:     General: No focal deficit present.     Mental Status: She is alert.  Psychiatric:        Mood and Affect: Mood normal.        Behavior: Behavior normal.     Lab  Results  Component Value Date   WBC 6.5 03/22/2022   HGB 10.8 (L) 03/22/2022   HCT 32.0 (L) 03/22/2022   PLT 205 03/22/2022   GLUCOSE 95 03/22/2022   CHOL 174 06/10/2021   TRIG 183.0 (H) 06/10/2021   HDL 58.50 06/10/2021   LDLCALC 79 06/10/2021   ALT 8 03/22/2022   AST 14 (L) 03/22/2022   NA 142 03/22/2022   K 3.8 03/22/2022   CL 108 03/22/2022   CREATININE 0.88 03/22/2022   BUN 14 03/22/2022   CO2 29 03/22/2022   TSH 3.35 04/28/2020   INR 0.9 02/19/2020    MM DIAG BREAST TOMO BILATERAL  Result Date: 08/04/2021 CLINICAL DATA:  77 year old female presenting for evaluation of diffuse bilateral breast pain for several months. EXAM: DIGITAL DIAGNOSTIC BILATERAL MAMMOGRAM WITH TOMOSYNTHESIS AND CAD TECHNIQUE: Bilateral digital diagnostic mammography and breast tomosynthesis was performed. The images were evaluated with computer-aided detection. COMPARISON:  Previous exam(s). ACR Breast Density Category c: The breast tissue is heterogeneously dense, which may obscure small masses. FINDINGS: Right breast: No suspicious mass, distortion, or microcalcifications are identified to suggest presence of malignancy. Left breast: No suspicious mass, distortion, or microcalcifications are identified to suggest presence of malignancy. IMPRESSION: No mammographic evidence of malignancy bilaterally or other finding to explain the patient's diffuse bilateral breast pain. RECOMMENDATION: 1. Clinical follow-up as needed for the bilateral breast pain. Breast pain is a common condition, which will often resolve on its own without intervention. It can be affected by hormonal changes, medication side effect, weight changes and fit of the bra. Pain may also be referred from other adjacent areas of the body. Breast pain may be improved by wearing adequate well-fitting support, over-the-counter topical and oral NSAID medication, low-fat diet, and ice/heat as needed. Studies have shown an improvement in cyclic pain with  use of evening primrose oil and vitamin E. 2.  Screening mammogram in one year.(Code:SM-B-01Y) I have discussed the findings and recommendations with the patient. If applicable, a reminder letter will be sent to the patient regarding the next appointment. BI-RADS CATEGORY  1: Negative. Electronically Signed   By: Audie Pinto M.D.   On: 08/04/2021 14:56    Assessment & Plan:  DDD (degenerative disc disease), lumbar -     HYDROcodone-Acetaminophen; Take 1 tablet by mouth every 8 (eight) hours as needed.  Dispense: 30 tablet; Refill: 0  Acute recurrent frontal sinusitis ---  Will treat with Augmentin. -     Amoxicillin-Pot Clavulanate; Take 1 tablet by mouth 2 (two) times daily for 10 days.  Dispense: 20 tablet; Refill: 0 -  Promethazine-DM; Take 5 mLs by mouth 4 (four) times daily as needed for up to 7 days for cough.  Dispense: 118 mL; Refill: 0  LRTI (lower respiratory tract infection) -     Amoxicillin-Pot Clavulanate; Take 1 tablet by mouth 2 (two) times daily for 10 days.  Dispense: 20 tablet; Refill: 0 -     Promethazine-DM; Take 5 mLs by mouth 4 (four) times daily as needed for up to 7 days for cough.  Dispense: 118 mL; Refill: 0     Follow-up: Return in about 4 weeks (around 05/20/2022).  Scarlette Calico, MD

## 2022-05-06 ENCOUNTER — Telehealth: Payer: Self-pay | Admitting: Internal Medicine

## 2022-05-06 DIAGNOSIS — K080 Exfoliation of teeth due to systemic causes: Secondary | ICD-10-CM | POA: Diagnosis not present

## 2022-05-06 NOTE — Telephone Encounter (Signed)
Patient came in and drop off a disk of her scan results for Dr. Ronnald Ramp. CD was placed in jones's box at the front.  Best callback number is [phone].

## 2022-05-07 DIAGNOSIS — H2512 Age-related nuclear cataract, left eye: Secondary | ICD-10-CM | POA: Diagnosis not present

## 2022-05-07 DIAGNOSIS — H2511 Age-related nuclear cataract, right eye: Secondary | ICD-10-CM | POA: Diagnosis not present

## 2022-05-11 ENCOUNTER — Ambulatory Visit: Payer: Medicare Other | Admitting: Internal Medicine

## 2022-05-17 DIAGNOSIS — H25811 Combined forms of age-related cataract, right eye: Secondary | ICD-10-CM | POA: Diagnosis not present

## 2022-05-28 DIAGNOSIS — H2512 Age-related nuclear cataract, left eye: Secondary | ICD-10-CM | POA: Diagnosis not present

## 2022-06-04 DIAGNOSIS — H25812 Combined forms of age-related cataract, left eye: Secondary | ICD-10-CM | POA: Diagnosis not present

## 2022-06-10 ENCOUNTER — Other Ambulatory Visit: Payer: Self-pay

## 2022-06-10 DIAGNOSIS — C8318 Mantle cell lymphoma, lymph nodes of multiple sites: Secondary | ICD-10-CM

## 2022-06-14 ENCOUNTER — Other Ambulatory Visit: Payer: Self-pay | Admitting: Internal Medicine

## 2022-06-19 ENCOUNTER — Other Ambulatory Visit: Payer: Self-pay | Admitting: Internal Medicine

## 2022-06-19 DIAGNOSIS — I700 Atherosclerosis of aorta: Secondary | ICD-10-CM

## 2022-06-21 ENCOUNTER — Inpatient Hospital Stay: Payer: Medicare Other | Admitting: Hematology

## 2022-06-21 ENCOUNTER — Other Ambulatory Visit: Payer: Self-pay

## 2022-06-21 ENCOUNTER — Inpatient Hospital Stay: Payer: Medicare Other

## 2022-06-21 ENCOUNTER — Inpatient Hospital Stay: Payer: Medicare Other | Attending: Hematology

## 2022-06-21 VITALS — BP 142/90 | HR 87 | Temp 97.3°F | Resp 17 | Ht 63.0 in | Wt 136.3 lb

## 2022-06-21 VITALS — BP 144/85 | HR 82 | Resp 17

## 2022-06-21 DIAGNOSIS — C8318 Mantle cell lymphoma, lymph nodes of multiple sites: Secondary | ICD-10-CM | POA: Diagnosis not present

## 2022-06-21 DIAGNOSIS — Z5112 Encounter for antineoplastic immunotherapy: Secondary | ICD-10-CM | POA: Insufficient documentation

## 2022-06-21 DIAGNOSIS — Z79899 Other long term (current) drug therapy: Secondary | ICD-10-CM | POA: Diagnosis not present

## 2022-06-21 DIAGNOSIS — D702 Other drug-induced agranulocytosis: Secondary | ICD-10-CM

## 2022-06-21 DIAGNOSIS — Z7189 Other specified counseling: Secondary | ICD-10-CM

## 2022-06-21 LAB — CBC WITH DIFFERENTIAL (CANCER CENTER ONLY)
Abs Immature Granulocytes: 0.01 10*3/uL (ref 0.00–0.07)
Basophils Absolute: 0 10*3/uL (ref 0.0–0.1)
Basophils Relative: 1 %
Eosinophils Absolute: 0 10*3/uL (ref 0.0–0.5)
Eosinophils Relative: 1 %
HCT: 35.4 % — ABNORMAL LOW (ref 36.0–46.0)
Hemoglobin: 12 g/dL (ref 12.0–15.0)
Immature Granulocytes: 0 %
Lymphocytes Relative: 18 %
Lymphs Abs: 0.9 10*3/uL (ref 0.7–4.0)
MCH: 32.8 pg (ref 26.0–34.0)
MCHC: 33.9 g/dL (ref 30.0–36.0)
MCV: 96.7 fL (ref 80.0–100.0)
Monocytes Absolute: 0.6 10*3/uL (ref 0.1–1.0)
Monocytes Relative: 13 %
Neutro Abs: 3.4 10*3/uL (ref 1.7–7.7)
Neutrophils Relative %: 67 %
Platelet Count: 193 10*3/uL (ref 150–400)
RBC: 3.66 MIL/uL — ABNORMAL LOW (ref 3.87–5.11)
RDW: 12.7 % (ref 11.5–15.5)
WBC Count: 5 10*3/uL (ref 4.0–10.5)
nRBC: 0 % (ref 0.0–0.2)

## 2022-06-21 LAB — CMP (CANCER CENTER ONLY)
ALT: 9 U/L (ref 0–44)
AST: 19 U/L (ref 15–41)
Albumin: 4.5 g/dL (ref 3.5–5.0)
Alkaline Phosphatase: 68 U/L (ref 38–126)
Anion gap: 9 (ref 5–15)
BUN: 12 mg/dL (ref 8–23)
CO2: 27 mmol/L (ref 22–32)
Calcium: 9.6 mg/dL (ref 8.9–10.3)
Chloride: 108 mmol/L (ref 98–111)
Creatinine: 0.89 mg/dL (ref 0.44–1.00)
GFR, Estimated: >60 mL/min (ref >60)
Glucose, Bld: 84 mg/dL (ref 70–99)
Potassium: 3.7 mmol/L (ref 3.5–5.1)
Sodium: 144 mmol/L (ref 135–145)
Total Bilirubin: 0.6 mg/dL (ref 0.3–1.2)
Total Protein: 6.7 g/dL (ref 6.5–8.1)

## 2022-06-21 LAB — LACTATE DEHYDROGENASE: LDH: 183 U/L (ref 98–192)

## 2022-06-21 MED ORDER — SODIUM CHLORIDE 0.9 % IV SOLN
375.0000 mg/m2 | Freq: Once | INTRAVENOUS | Status: AC
  Administered 2022-06-21: 600 mg via INTRAVENOUS
  Filled 2022-06-21: qty 50

## 2022-06-21 MED ORDER — MONTELUKAST SODIUM 10 MG PO TABS
10.0000 mg | ORAL_TABLET | Freq: Once | ORAL | Status: AC
  Administered 2022-06-21: 10 mg via ORAL
  Filled 2022-06-21: qty 1

## 2022-06-21 MED ORDER — SODIUM CHLORIDE 0.9 % IV SOLN
12.0000 mg | Freq: Once | INTRAVENOUS | Status: AC
  Administered 2022-06-21: 12 mg via INTRAVENOUS
  Filled 2022-06-21: qty 1.2

## 2022-06-21 MED ORDER — ACETAMINOPHEN 325 MG PO TABS
650.0000 mg | ORAL_TABLET | Freq: Once | ORAL | Status: AC
  Administered 2022-06-21: 650 mg via ORAL
  Filled 2022-06-21: qty 2

## 2022-06-21 MED ORDER — SODIUM CHLORIDE 0.9 % IV SOLN: Freq: Once | INTRAVENOUS | Status: AC

## 2022-06-21 MED ORDER — FAMOTIDINE 20 MG IN NS 100 ML IVPB
20.0000 mg | Freq: Once | INTRAVENOUS | Status: AC
  Administered 2022-06-21: 20 mg via INTRAVENOUS
  Filled 2022-06-21: qty 100

## 2022-06-21 MED ORDER — DIPHENHYDRAMINE HCL 25 MG PO CAPS
50.0000 mg | ORAL_CAPSULE | Freq: Once | ORAL | Status: AC
  Administered 2022-06-21: 50 mg via ORAL
  Filled 2022-06-21: qty 2

## 2022-06-21 NOTE — Progress Notes (Signed)
Patient seen by Dr. Kale  Vitals are within treatment parameters.  Labs reviewed: and are within treatment parameters.  Per physician team, patient is ready for treatment and there are NO modifications to the treatment plan.  

## 2022-06-21 NOTE — Progress Notes (Signed)
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   Patient was last seen by me on 03/22/2022 and she was doing well overall, except minor skin rashes.   Patient reports she has been doing well overall without any new or severe medical concerns since our last visit. She denies fever, chills, night sweats, unexpected weight loss, abdominal pain, new lumps/bumps, abnormal bowel movement, chest pain, back pain, or leg swelling.   She notes that she has been tolerating her maintenance Rituxan treatment well without any severe toxicities. She does complain of mild skin rash/color change near her face due to Rituxan treatment. However, she notes her symptoms improve after taking Prednisone.   She is complaint with all of her medications.   She notes she has been eating well, staying well hydrated, and regularly exercising.   REVIEW OF SYSTEMS:   10 Point review of Systems was done is negative except as noted  above.  Past Medical History:  Diagnosis Date   Allergy    Arthritis    Back pain    Complication of anesthesia    bp dropped yrs ago, recent surgeries went ok   Dysrhythmia    occ, no meds for   Gastritis    GERD (gastroesophageal reflux disease)    H. pylori infection    Headache    migraines occ   Hiatal hernia    Hx of adenomatous colonic polyps 11/30/2017   Hyperlipidemia    Hypothyroidism 1970's   hx of years ago, no meds for    mantle cell lymphoma    Pulmonary nodule    Shortness of breath dyspnea    with exertion, pt told comes from acid reflux    . Past Surgical History:  Procedure Laterality Date   APPENDECTOMY     COLONOSCOPY  12/07/2013   DILATION AND CURETTAGE OF UTERUS     LAPAROSCOPIC CHOLECYSTECTOMY SINGLE SITE WITH INTRAOPERATIVE CHOLANGIOGRAM N/A 10/28/2015   Procedure: LAPAROSCOPIC CHOLECYSTECTOMY WITH INTRAOPERATIVE CHOLANGIOGRAM;  Surgeon: Darnell Level, MD;  Location: [facility];  Service: General;  Laterality: N/A;   LAPAROSCOPY  yrs ago   x 2   TMJ ARTHROSCOPY     TOTAL ABDOMINAL HYSTERECTOMY     complete    . Social History   Tobacco Use   Smoking status: Never   Smokeless tobacco: Never  Vaping Use   Vaping Use: Never used  Substance Use Topics   Alcohol use: Yes    Alcohol/week: 4.0 - 5.0 standard drinks of alcohol    Types: 4 -  5 Cans of beer per week    Comment: 4-5 beers weekly-occ per pt   Drug use: No    ALLERGIES:  has No Known Allergies.  MEDICATIONS:  Current Outpatient Medications  Medication Sig Dispense Refill   ALPRAZolam (XANAX) 0.5 MG tablet Take 0.5 mg by mouth 3 (three) times daily.     ASPIRIN LOW DOSE 81 MG tablet TAKE 1 TABLET BY MOUTH EVERY DAY 90 tablet 1   Cholecalciferol (VITAMIN D3) 5000 units CAPS Take 1 capsule by mouth daily.     fluticasone (FLONASE) 50 MCG/ACT nasal spray Place 1 spray into both nostrils daily as needed for allergies.     omeprazole (PRILOSEC) 20 MG capsule TAKE 1 CAPSULE BY MOUTH  DAILY 30 MINUTES BEFORE MORNING MEAL 90 capsule 0   rosuvastatin (CRESTOR) 5 MG tablet TAKE 1 TABLET BY MOUTH ONCE DAILY 90 tablet 1   vitamin B-12 (CYANOCOBALAMIN) 500 MCG tablet Take 500 mcg by mouth daily.     No current facility-administered medications for this visit.    PHYSICAL EXAMINATION: .BP (!) 142/90 (BP Location: Left Arm, Patient Position: Sitting) Comment: nurse is aware  Pulse 87   Temp (!) 97.3 F (36.3 C) (Temporal)   Resp 17   Ht 5\' 3"  (1.6 m)   Wt 136 lb 4.8 oz (61.8 kg)   SpO2 100%   BMI 24.14 kg/m  NAD GENERAL:alert, in no acute distress and comfortable SKIN: no acute rashes, no significant lesions EYES: conjunctiva are pink and non-injected, sclera anicteric NECK: supple, no JVD LYMPH:  no palpable lymphadenopathy in the cervical, axillary or inguinal regions LUNGS: clear to auscultation b/l with normal respiratory effort HEART: regular rate & rhythm ABDOMEN:  normoactive bowel sounds , non tender, not distended. Extremity: no pedal edema PSYCH: alert & oriented x 3 with fluent speech NEURO: no focal motor/sensory deficits  LABORATORY DATA:   I have reviewed the data as listed      Latest Ref Rng & Units 06/21/2022   11:12 AM 03/22/2022   11:14 AM 12/21/2021   11:41 AM  CBC  WBC 4.0 - 10.5 K/uL 5.0  6.5  5.6   Hemoglobin 12.0 - 15.0 g/dL 69.6  29.5  28.4   Hematocrit 36.0 - 46.0 % 35.4  32.0  33.1   Platelets 150 - 400 K/uL 193  205  220     .    Latest Ref Rng & Units 06/21/2022   11:12 AM 03/22/2022   11:14 AM 12/21/2021   11:41 AM  CMP  Glucose 70 - 99 mg/dL 84  95  85   BUN 8 - 23 mg/dL 12  14  15    Creatinine 0.44 - 1.00 mg/dL 1.32  4.40  1.02   Sodium 135 - 145 mmol/L 144  142  140   Potassium 3.5 - 5.1 mmol/L 3.7  3.8  3.7   Chloride 98 - 111 mmol/L 108  108  106   CO2 22 - 32 mmol/L 27  29  27    Calcium 8.9 - 10.3 mg/dL 9.6  9.3  9.7   Total Protein 6.5 - 8.1 g/dL 6.7  6.1  6.7   Total Bilirubin 0.3 - 1.2 mg/dL 0.6  0.7  0.7    Alkaline Phos 38 - 126 U/L 68  66  64   AST 15 - 41 U/L 19  14  16    ALT 0 - 44 U/L 9  8  8     . Lab Results  Component Value Date   LDH 183 06/21/2022   RADIOGRAPHIC STUDIES: I have personally reviewed the radiological images as listed and agreed with the findings in the report. No results found.   ASSESSMENT & PLAN:   77 yo with   1) Stage III/IV Mantle cell lymphoma 01/11/2020 PET/CT [id number]) revealed no visible disease, continued resolution. 2) s/p hypersensitivity reaction to Rapid Rituxan. - rash and low grade fever -  resolved with solumedrol/tylenol, benadryl and famotidine   PLAN: -Dicussed lab results from today, 06/21/2022, with the patient. CBC and CMP stable. LDH in the normal range.   -Patient has no clinical symptoms suggestive of mantle cell lymphoma at this time. -Patient did have mild toxicities from her last dose of Rituxan mx with post Rituxan claritin/famotidine and prednisone -Patient can continue Rituxan treatment today without any dose modification.  -Continue her B12 and vitamin D supplements.   FOLLOW-UP: Plz schedule next 2 doses of maintenance Rituxan every 90 days with labs and MD visits  The total time spent in the appointment was 30 minutes* .  All of the patient's questions were answered with apparent satisfaction. The patient knows to call the clinic with any problems, questions or concerns.   Wyvonnia Lora MD MS AAHIVMS [facility] Hematology/Oncology Physician [facility]  .*Total Encounter Time as defined by the Centers for Medicare and Medicaid Services includes, in addition to the face-to-face time of a patient visit (documented in the note above) non-face-to-face time: obtaining and reviewing outside history, ordering and reviewing medications, tests or procedures, care coordination (communications with other health care professionals or caregivers) and documentation in the medical record.   I, Ok Edwards, am acting as a  Neurosurgeon for Wyvonnia Lora, MD.  .I have reviewed the above documentation for accuracy and completeness, and I agree with the above. Johney Maine MD

## 2022-06-22 ENCOUNTER — Telehealth: Payer: Self-pay | Admitting: Hematology

## 2022-06-22 ENCOUNTER — Other Ambulatory Visit: Payer: Self-pay

## 2022-06-23 ENCOUNTER — Other Ambulatory Visit: Payer: Self-pay

## 2022-06-27 ENCOUNTER — Encounter: Payer: Self-pay | Admitting: Hematology

## 2022-07-05 ENCOUNTER — Other Ambulatory Visit: Payer: Self-pay

## 2022-07-06 ENCOUNTER — Other Ambulatory Visit: Payer: Self-pay | Admitting: Internal Medicine

## 2022-07-06 DIAGNOSIS — Z1231 Encounter for screening mammogram for malignant neoplasm of breast: Secondary | ICD-10-CM

## 2022-07-10 HISTORY — PX: EYE SURGERY: SHX253

## 2022-07-15 DIAGNOSIS — C8318 Mantle cell lymphoma, lymph nodes of multiple sites: Secondary | ICD-10-CM | POA: Diagnosis not present

## 2022-07-29 ENCOUNTER — Encounter: Payer: Medicare Other | Admitting: Internal Medicine

## 2022-08-04 ENCOUNTER — Ambulatory Visit (INDEPENDENT_AMBULATORY_CARE_PROVIDER_SITE_OTHER): Payer: Medicare Other | Admitting: Internal Medicine

## 2022-08-04 ENCOUNTER — Encounter: Payer: Self-pay | Admitting: Internal Medicine

## 2022-08-04 VITALS — BP 128/84 | HR 86 | Temp 98.2°F | Resp 16 | Ht 63.0 in | Wt 134.0 lb

## 2022-08-04 DIAGNOSIS — E7841 Elevated Lipoprotein(a): Secondary | ICD-10-CM

## 2022-08-04 DIAGNOSIS — E785 Hyperlipidemia, unspecified: Secondary | ICD-10-CM

## 2022-08-04 DIAGNOSIS — I700 Atherosclerosis of aorta: Secondary | ICD-10-CM | POA: Diagnosis not present

## 2022-08-04 DIAGNOSIS — C8318 Mantle cell lymphoma, lymph nodes of multiple sites: Secondary | ICD-10-CM

## 2022-08-04 DIAGNOSIS — Z0000 Encounter for general adult medical examination without abnormal findings: Secondary | ICD-10-CM

## 2022-08-04 DIAGNOSIS — M5136 Other intervertebral disc degeneration, lumbar region: Secondary | ICD-10-CM

## 2022-08-04 DIAGNOSIS — Z0001 Encounter for general adult medical examination with abnormal findings: Secondary | ICD-10-CM

## 2022-08-04 LAB — LIPID PANEL
Cholesterol: 166 mg/dL (ref 0–200)
HDL: 53.8 mg/dL (ref >39.00)
LDL Cholesterol: 73 mg/dL (ref 0–99)
NonHDL: 112.1
Total CHOL/HDL Ratio: 3
Triglycerides: 198 mg/dL — ABNORMAL HIGH (ref 0.0–149.0)
VLDL: 39.6 mg/dL (ref 0.0–40.0)

## 2022-08-04 LAB — TSH: TSH: 2.64 u[IU]/mL (ref 0.35–5.50)

## 2022-08-04 MED ORDER — HYDROCODONE-ACETAMINOPHEN 10-325 MG PO TABS
1.0000 | ORAL_TABLET | Freq: Three times a day (TID) | ORAL | 0 refills | Status: DC | PRN
Start: 2022-08-04 — End: 2023-03-17

## 2022-08-04 NOTE — Patient Instructions (Signed)

## 2022-08-04 NOTE — Progress Notes (Signed)
Subjective:  Patient ID: Brooke [name], female    DOB: [birth year]  Age: 77 y.o. MRN: [id number]  CC: Annual Exam and Hyperlipidemia   HPI Brooke [name] presents for a CPX and f/up ----  Discussed the use of AI scribe software for clinical note transcription with the patient, who gave verbal consent to proceed.  History of Present Illness   The patient, with a history of mantle cell lymphoma and chronic low back pain, presents for a routine checkup. They deny any chest pain, lightheadedness, or dizziness. They engage in regular exercise, walking approximately a mile and a half twice a week, without experiencing shortness of breath, chest discomfort, or calf pain.  Regarding their chronic low back pain, it becomes more pronounced with activity. The pain does not radiate into the legs, and there is no associated numbness, weakness, or tingling. The pain is well-controlled with a hydrocodone-acetaminophen combination.  Their mantle cell lymphoma is reportedly under control, and they are currently on maintenance treatment via IV. They deny any symptoms of lymphoma such as night sweats, abdominal pain, swollen lymph nodes, or weight loss. However, they report weight gain. Their most recent CBC from May showed mild anemia, but otherwise normal.       Outpatient Medications Prior to Visit  Medication Sig Dispense Refill   ALPRAZolam (XANAX) 0.5 MG tablet Take 0.5 mg by mouth 3 (three) times daily.     ASPIRIN LOW DOSE 81 MG tablet TAKE 1 TABLET BY MOUTH EVERY DAY 90 tablet 1   Cholecalciferol (VITAMIN D3) 5000 units CAPS Take 1 capsule by mouth daily.     fluticasone (FLONASE) 50 MCG/ACT nasal spray Place 1 spray into both nostrils daily as needed for allergies.     omeprazole (PRILOSEC) 20 MG capsule TAKE 1 CAPSULE BY MOUTH DAILY 30 MINUTES BEFORE MORNING MEAL 90 capsule 0   predniSONE (DELTASONE) 20 MG tablet Take 1 tablet (20 mg total) by mouth daily with breakfast.  For 5 days after each Rituxan infusion 5 tablet 5   rosuvastatin (CRESTOR) 5 MG tablet TAKE 1 TABLET BY MOUTH ONCE DAILY 90 tablet 1   vitamin B-12 (CYANOCOBALAMIN) 500 MCG tablet Take 500 mcg by mouth daily.     HYDROcodone-acetaminophen (NORCO) 10-325 MG tablet Take 1 tablet by mouth every 8 (eight) hours as needed. 30 tablet 0   No facility-administered medications prior to visit.    ROS Review of Systems  Constitutional:  Negative for chills, diaphoresis, fatigue and fever.  HENT: Negative.    Eyes: Negative.   Respiratory: Negative.  Negative for cough, chest tightness and wheezing.   Cardiovascular:  Negative for chest pain, palpitations and leg swelling.  Gastrointestinal:  Negative for abdominal pain, constipation, diarrhea, nausea and vomiting.  Endocrine: Negative.   Genitourinary: Negative.   Musculoskeletal:  Positive for back pain. Negative for arthralgias and myalgias.  Skin: Negative.  Negative for color change, pallor and rash.  Neurological: Negative.  Negative for dizziness, weakness and numbness.  Hematological:  Negative for adenopathy. Does not bruise/bleed easily.  Psychiatric/Behavioral: Negative.      Objective:  BP 128/84 (BP Location: Left Arm, Patient Position: Sitting, Cuff Size: Large)   Pulse 86   Temp 98.2 F (36.8 C) (Oral)   Resp 16   Ht 5\' 3"  (1.6 m)   Wt 134 lb (60.8 kg)   SpO2 94%   BMI 23.74 kg/m   BP Readings from Last 3 Encounters:  08/04/22 128/84  06/21/22 (!) 144/85  06/21/22 (!) 142/90    Wt Readings from Last 3 Encounters:  08/04/22 134 lb (60.8 kg)  06/21/22 136 lb 4.8 oz (61.8 kg)  04/22/22 132 lb (59.9 kg)    Physical Exam Vitals reviewed.  Constitutional:      Appearance: Normal appearance.  HENT:     Nose: Nose normal.     Mouth/Throat:     Mouth: Mucous membranes are moist.  Eyes:     General: No scleral icterus.    Conjunctiva/sclera: Conjunctivae normal.  Cardiovascular:     Rate and Rhythm: Normal rate  and regular rhythm.     Heart sounds: No murmur heard. Pulmonary:     Effort: Pulmonary effort is normal.     Breath sounds: No stridor. No wheezing, rhonchi or rales.  Abdominal:     General: Abdomen is flat.     Palpations: There is no mass.     Tenderness: There is no abdominal tenderness. There is no guarding.     Hernia: No hernia is present.  Musculoskeletal:        General: Normal range of motion.     Cervical back: Neck supple.     Right lower leg: No edema.     Left lower leg: No edema.  Lymphadenopathy:     Cervical: No cervical adenopathy.  Skin:    General: Skin is warm and dry.  Neurological:     General: No focal deficit present.     Mental Status: She is alert. Mental status is at baseline.  Psychiatric:        Mood and Affect: Mood normal.        Behavior: Behavior normal.     Lab Results  Component Value Date   WBC 5.0 06/21/2022   HGB 12.0 06/21/2022   HCT 35.4 (L) 06/21/2022   PLT 193 06/21/2022   GLUCOSE 84 06/21/2022   CHOL 166 08/04/2022   TRIG 198.0 (H) 08/04/2022   HDL 53.80 08/04/2022   LDLCALC 73 08/04/2022   ALT 9 06/21/2022   AST 19 06/21/2022   NA 144 06/21/2022   K 3.7 06/21/2022   CL 108 06/21/2022   CREATININE 0.89 06/21/2022   BUN 12 06/21/2022   CO2 27 06/21/2022   TSH 2.64 08/04/2022   INR 0.9 02/19/2020    MM DIAG BREAST TOMO BILATERAL  Result Date: 08/04/2021 CLINICAL DATA:  77 year old female presenting for evaluation of diffuse bilateral breast pain for several months. EXAM: DIGITAL DIAGNOSTIC BILATERAL MAMMOGRAM WITH TOMOSYNTHESIS AND CAD TECHNIQUE: Bilateral digital diagnostic mammography and breast tomosynthesis was performed. The images were evaluated with computer-aided detection. COMPARISON:  Previous exam(s). ACR Breast Density Category c: The breast tissue is heterogeneously dense, which may obscure small masses. FINDINGS: Right breast: No suspicious mass, distortion, or microcalcifications are identified to suggest  presence of malignancy. Left breast: No suspicious mass, distortion, or microcalcifications are identified to suggest presence of malignancy. IMPRESSION: No mammographic evidence of malignancy bilaterally or other finding to explain the patient's diffuse bilateral breast pain. RECOMMENDATION: 1. Clinical follow-up as needed for the bilateral breast pain. Breast pain is a common condition, which will often resolve on its own without intervention. It can be affected by hormonal changes, medication side effect, weight changes and fit of the bra. Pain may also be referred from other adjacent areas of the body. Breast pain may be improved by wearing adequate well-fitting support, over-the-counter topical and oral NSAID medication, low-fat diet, and ice/heat as needed. Studies have shown an improvement  in cyclic pain with use of evening primrose oil and vitamin E. 2.  Screening mammogram in one year.(Code:SM-B-01Y) I have discussed the findings and recommendations with the patient. If applicable, a reminder letter will be sent to the patient regarding the next appointment. BI-RADS CATEGORY  1: Negative. Electronically Signed   By: Emmaline Kluver M.D.   On: 08/04/2021 14:56    Assessment & Plan:   Atherosclerosis of aorta (HCC)- Risk factor modifications addressed. -     Lipoprotein A (LPA); Future -     Lipid panel; Future -     Ambulatory referral to Cardiology  DDD (degenerative disc disease), lumbar -     HYDROcodone-Acetaminophen; Take 1 tablet by mouth every 8 (eight) hours as needed.  Dispense: 30 tablet; Refill: 0  Encounter for general adult medical examination with abnormal findings- Exam completed, labs reviewed, vaccines reviewed, no cancer screenings indicated, pt ed material was given.  Hyperlipidemia with target LDL less than 130 - LDL goal achieved. Doing well on the statin  -     Lipoprotein A (LPA); Future -     Lipid panel; Future -     TSH; Future  Mantle cell lymphoma of lymph  nodes of multiple regions [facility])- She has responded well to treatment.  Elevated Lp(a) -     Ambulatory referral to Cardiology     Follow-up: Return in about 6 months (around 02/03/2023).  Sanda Linger, MD

## 2022-08-05 ENCOUNTER — Other Ambulatory Visit: Payer: Self-pay

## 2022-08-06 ENCOUNTER — Ambulatory Visit
Admission: RE | Admit: 2022-08-06 | Discharge: 2022-08-06 | Disposition: A | Discharge status: Home / Self Care | Payer: Medicare Other | Source: Ambulatory Visit | Attending: Internal Medicine | Admitting: Internal Medicine

## 2022-08-06 DIAGNOSIS — Z1231 Encounter for screening mammogram for malignant neoplasm of breast: Secondary | ICD-10-CM | POA: Diagnosis not present

## 2022-08-10 DIAGNOSIS — E7841 Elevated Lipoprotein(a): Secondary | ICD-10-CM

## 2022-08-10 HISTORY — DX: Elevated lipoprotein(a): E78.41

## 2022-08-10 LAB — LIPOPROTEIN A (LPA): Lipoprotein (a): 214 nmol/L — ABNORMAL HIGH (ref <75)

## 2022-09-08 ENCOUNTER — Encounter: Payer: Self-pay | Admitting: Cardiology

## 2022-09-09 ENCOUNTER — Ambulatory Visit: Payer: Medicare Other | Attending: Cardiology | Admitting: Cardiology

## 2022-09-09 ENCOUNTER — Encounter: Payer: Self-pay | Admitting: Cardiology

## 2022-09-09 VITALS — BP 110/68 | HR 84 | Ht 63.0 in | Wt 134.0 lb

## 2022-09-09 DIAGNOSIS — I2584 Coronary atherosclerosis due to calcified coronary lesion: Secondary | ICD-10-CM

## 2022-09-09 DIAGNOSIS — I700 Atherosclerosis of aorta: Secondary | ICD-10-CM | POA: Diagnosis not present

## 2022-09-09 DIAGNOSIS — E7841 Elevated Lipoprotein(a): Secondary | ICD-10-CM | POA: Diagnosis not present

## 2022-09-09 DIAGNOSIS — R0609 Other forms of dyspnea: Secondary | ICD-10-CM

## 2022-09-09 DIAGNOSIS — I2510 Atherosclerotic heart disease of native coronary artery without angina pectoris: Secondary | ICD-10-CM

## 2022-09-09 DIAGNOSIS — E785 Hyperlipidemia, unspecified: Secondary | ICD-10-CM

## 2022-09-09 NOTE — Progress Notes (Signed)
Cardiology Office Note:    Date:  09/09/2022   ID:  Brooke [name], DOB Nov 01, 1945, MRN [id number]  PCP:  Etta Grandchild, MD  Cardiologist:  Gypsy Balsam, MD    Referring MD: Etta Grandchild, MD   Chief Complaint  Patient presents with   Elevated Lipids   Arthrosclerosis of Aorta    Fatigue   Shortness of Breath         History of Present Illness:    Brooke [name] is a 77 y.o. 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  She was referred to [place] because of calcification of the coronary arteries that were noted on CT few years ago.  Overall she seems to be doing well she said her ability to exercise is somewhat limited that probably results of lymphoma and chemotherapy.  Denies having any chest pain tightness squeezing pressure burning chest.  She does get short of breath while walking.  She is being treated with Crestor for many years.  She does have multiple family members with coronary artery disease in her problem some premature.  She used to exercise on the regular basis but for last 3 years does not do it.  She does not smoke but she was exposed to secondhand smoking almost all her life her husband smoked he passed recently.  Does not exercise on the regular basis not on any special diet  Past Medical History:  Diagnosis Date   Allergy    Arthritis    Back pain    Complication of anesthesia    bp dropped yrs ago, recent surgeries went ok   Dysrhythmia    occ, no meds for   Gastritis    GERD (gastroesophageal reflux disease)    H. pylori infection    Headache    migraines occ   Hiatal hernia    Hx of adenomatous colonic polyps 11/30/2017   Hyperlipidemia    Hypothyroidism 1970's   hx of years ago, no meds for    mantle cell lymphoma    Pulmonary nodule    Shortness of breath dyspnea    with exertion,  pt told comes from acid reflux    Past Surgical History:  Procedure Laterality Date   APPENDECTOMY     COLONOSCOPY  12/07/2013   DILATION AND CURETTAGE OF UTERUS     LAPAROSCOPIC CHOLECYSTECTOMY SINGLE SITE WITH INTRAOPERATIVE CHOLANGIOGRAM N/A 10/28/2015   Procedure: LAPAROSCOPIC CHOLECYSTECTOMY WITH INTRAOPERATIVE CHOLANGIOGRAM;  Surgeon: Darnell Level, MD;  Location: [facility];  Service: General;  Laterality: N/A;   LAPAROSCOPY  yrs ago   x 2   TMJ ARTHROSCOPY     TOTAL ABDOMINAL HYSTERECTOMY     complete    Current Medications: Current Meds  Medication Sig   ALPRAZolam (XANAX) 0.5 MG tablet Take 0.5 mg by mouth 3 (three) times daily.   ASPIRIN LOW DOSE 81 MG tablet TAKE 1 TABLET BY MOUTH EVERY DAY   Cholecalciferol (VITAMIN D3) 5000 units CAPS Take 1 capsule by mouth daily.   fluticasone (FLONASE) 50 MCG/ACT nasal spray Place 1 spray into both nostrils daily as needed for allergies.   HYDROcodone-acetaminophen (NORCO) 10-325 MG tablet Take 1 tablet by mouth every 8 (eight) hours as needed. (Patient taking differently: Take 1 tablet by mouth every 8 (eight) hours as needed for moderate pain or severe pain.)   omeprazole (PRILOSEC) 20 MG capsule TAKE 1 CAPSULE  BY MOUTH DAILY 30 MINUTES BEFORE MORNING MEAL (Patient taking differently: Take 20 mg by mouth daily.)   predniSONE (DELTASONE) 20 MG tablet Take 1 tablet (20 mg total) by mouth daily with breakfast. For 5 days after each Rituxan infusion   rosuvastatin (CRESTOR) 5 MG tablet TAKE 1 TABLET BY MOUTH ONCE DAILY   vitamin B-12 (CYANOCOBALAMIN) 500 MCG tablet Take 500 mcg by mouth daily.     Allergies:   Patient has no known allergies.   Social History   Socioeconomic History   Marital status: Married    Spouse name: Not on file   Number of children: Not on file   Years of education: Not on file   Highest education level: Not on file  Occupational History   Not on file  Tobacco Use   Smoking status: Never   Smokeless  tobacco: Never  Vaping Use   Vaping status: Never Used  Substance and Sexual Activity   Alcohol use: Yes    Alcohol/week: 4.0 - 5.0 standard drinks of alcohol    Types: 4 - 5 Cans of beer per week    Comment: 4-5 beers weekly-occ per pt   Drug use: No   Sexual activity: Not on file  Other Topics Concern   Not on file  Social History Narrative   Not on file   Social Determinants of Health   Financial Resource Strain: Low Risk  (09/21/2021)   Overall Financial Resource Strain (CARDIA)    Difficulty of Paying Living Expenses: Not hard at all  Food Insecurity: No Food Insecurity (09/21/2021)   Hunger Vital Sign    Worried About Running Out of Food in the Last Year: Never true    Ran Out of Food in the Last Year: Never true  Transportation Needs: No Transportation Needs (09/21/2021)   PRAPARE - Administrator, Civil Service (Medical): No    Lack of Transportation (Non-Medical): No  Physical Activity: Sufficiently Active (09/21/2021)   Exercise Vital Sign    Days of Exercise per Week: 3 days    Minutes of Exercise per Session: 60 min  Stress: No Stress Concern Present (09/21/2021)   [employer] of Occupational Health - Occupational Stress Questionnaire    Feeling of Stress : Only a little  Social Connections: Unknown (09/21/2021)   Social Connection and Isolation Panel [NHANES]    Frequency of Communication with Friends and Family: More than three times a week    Frequency of Social Gatherings with Friends and Family: More than three times a week    Attends Religious Services: More than 4 times per year    Active Member of [employer] or Organizations: No    Attends Banker Meetings: Never    Marital Status: Not on file     Family History: The patient's family history includes Breast cancer in her cousin and maternal aunt; Colon cancer in her cousin, father, and paternal grandfather; Colon polyps in her brother and sister; Heart failure in her mother; Kidney  cancer in her brother; Lung cancer in her brother; Ovarian cancer in her sister. There is no history of Rectal cancer, Stomach cancer, or Esophageal cancer. ROS:   Please see the history of present illness.    All 14 point review of systems negative except as described per history of present illness  EKGs/Labs/Other Studies Reviewed:         Recent Labs: 06/21/2022: ALT 9; BUN 12; Creatinine 0.89; Hemoglobin 12.0; Platelet Count 193; Potassium 3.7;  Sodium 144 08/04/2022: TSH 2.64  Recent Lipid Panel    Component Value Date/Time   CHOL 166 08/04/2022 0910   TRIG 198.0 (H) 08/04/2022 0910   HDL 53.80 08/04/2022 0910   CHOLHDL 3 08/04/2022 0910   VLDL 39.6 08/04/2022 0910   LDLCALC 73 08/04/2022 0910    Physical Exam:    VS:  BP 110/68 (BP Location: Left Arm, Patient Position: Sitting)   Pulse 84   Ht 5\' 3"  (1.6 m)   Wt 134 lb (60.8 kg)   SpO2 95%   BMI 23.74 kg/m     Wt Readings from Last 3 Encounters:  09/09/22 134 lb (60.8 kg)  08/04/22 134 lb (60.8 kg)  06/21/22 136 lb 4.8 oz (61.8 kg)     GEN:  Well nourished, well developed in no acute distress HEENT: Normal NECK: No JVD; No carotid bruits LYMPHATICS: No lymphadenopathy CARDIAC: RRR, no murmurs, no rubs, no gallops RESPIRATORY:  Clear to auscultation without rales, wheezing or rhonchi  ABDOMEN: Soft, non-tender, non-distended MUSCULOSKELETAL:  No edema; No deformity  SKIN: Warm and dry LOWER EXTREMITIES: no swelling NEUROLOGIC:  Alert and oriented x 3 PSYCHIATRIC:  Normal affect   ASSESSMENT:    1. Atherosclerosis of aorta (HCC)   2. Coronary artery calcification   3. Dyslipidemia   4. Elevated Lp(a)    PLAN:    In order of problems listed above:  Atherosclerosis of aorta with calcification of the coronary arteries.  Will do stress test to see if there is an obstructive disease.  She does not have any typical symptoms but her ability to exercise somewhat limited.  In the meantime we will continue  present medication she is being excellently followed by her primary care physician with antiplatelet therapy as well as statin. Dyslipidemia I did review K PN which show me LDL of 73 HDL 53.  She is on Crestor 5 daily which I will continue. Elevated LP(a) show I spoke to her about potential referral to our lipid clinic for consideration of research for elevated LP(a) she is willing to participate in this.  Will refer her there. We talked about healthy lifestyle need to exercise on the regular basis as well as we discussed basic of Mediterranean diet. Dyspnea exertion echocardiogram will be done to assess left ventricle ejection fraction   Medication Adjustments/Labs and Tests Ordered: Current medicines are reviewed at length with the patient today.  Concerns regarding medicines are outlined above.  Orders Placed This Encounter  Procedures   EKG 12-Lead   EKG 12-Lead   Medication changes: No orders of the defined types were placed in this encounter.   Signed, Georgeanna Lea, MD, [facility] 09/09/2022 4:34 PM    [facility]

## 2022-09-09 NOTE — Patient Instructions (Addendum)
Medication Instructions:  Your physician recommends that you continue on your current medications as directed. Please refer to the Current Medication list given to you today.  *If you need a refill on your cardiac medications before your next appointment, please call your pharmacy*   Lab Work: None Ordered If you have labs (blood work) drawn today and your tests are completely normal, you will receive your results only by: MyChart Message (if you have MyChart) OR A paper copy in the mail If you have any lab test that is abnormal or we need to change your treatment, we will call you to review the results.   Testing/Procedures: Your physician has requested that you have an echocardiogram. Echocardiography is a painless test that uses sound waves to create images of your heart. It provides your doctor with information about the size and shape of your heart and how well your heart's chambers and valves are working. This procedure takes approximately one hour. There are no restrictions for this procedure. Please do NOT wear cologne, perfume, aftershave, or lotions (deodorant is allowed). Please arrive 15 minutes prior to your appointment time.\  Your physician has requested that you have a lexiscan myoview. For further information please visit [email or web address] Please follow instruction sheet, as given.  The test will take approximately 3 to 4 hours to complete; you may bring reading material.  If someone comes with you to your appointment, they will need to remain in the main lobby due to limited space in the testing area  How to prepare for your Myocardial Perfusion Test: Do not eat or drink 3 hours prior to your test, except you may have water. Do not consume products containing caffeine (regular or decaffeinated) 12 hours prior to your test. (ex: coffee, chocolate, sodas, tea). Do bring a list of your current medications with you.  If not listed below, you may take your medications as  normal. Do wear comfortable clothes (no dresses or overalls) and walking shoes, tennis shoes preferred (No heels or open toe shoes are allowed). Do NOT wear cologne, perfume, aftershave, or lotions (deodorant is allowed). If these instructions are not followed, your test will have to be rescheduled.      Follow-Up: At [facility], you and your health needs are our priority.  As part of our continuing mission to provide you with exceptional heart care, we have created designated Provider Care Teams.  These Care Teams include your primary Cardiologist (physician) and Advanced Practice Providers (APPs -  Physician Assistants and Nurse Practitioners) who all work together to provide you with the care you need, when you need it.  We recommend signing up for the patient portal called "MyChart".  Sign up information is provided on this After Visit Summary.  MyChart is used to connect with patients for Virtual Visits (Telemedicine).  Patients are able to view lab/test results, encounter notes, upcoming appointments, etc.  Non-urgent messages can be sent to your provider as well.   To learn more about what you can do with MyChart, go to [email or web address].    Your next appointment:   2 month(s)  The format for your next appointment:   In Person  Provider:   Gypsy Balsam, MD    Other Instructions Lipid Clinic referral- They will call for appt

## 2022-09-14 ENCOUNTER — Encounter (HOSPITAL_COMMUNITY): Payer: Self-pay | Admitting: *Deleted

## 2022-09-14 ENCOUNTER — Telehealth (HOSPITAL_COMMUNITY): Payer: Self-pay | Admitting: *Deleted

## 2022-09-14 DIAGNOSIS — D485 Neoplasm of uncertain behavior of skin: Secondary | ICD-10-CM | POA: Diagnosis not present

## 2022-09-14 DIAGNOSIS — D2272 Melanocytic nevi of left lower limb, including hip: Secondary | ICD-10-CM | POA: Diagnosis not present

## 2022-09-14 DIAGNOSIS — B078 Other viral warts: Secondary | ICD-10-CM | POA: Diagnosis not present

## 2022-09-14 DIAGNOSIS — D225 Melanocytic nevi of trunk: Secondary | ICD-10-CM | POA: Diagnosis not present

## 2022-09-14 NOTE — Telephone Encounter (Signed)
Letter sent to my chart with instructions for upcoming stress test.  [name], Brooke Jacqueline  

## 2022-09-15 ENCOUNTER — Ambulatory Visit: Payer: Medicare Other | Attending: Cardiology

## 2022-09-15 DIAGNOSIS — R0609 Other forms of dyspnea: Secondary | ICD-10-CM | POA: Diagnosis not present

## 2022-09-15 LAB — MYOCARDIAL PERFUSION IMAGING
LV dias vol: 42 mL (ref 46–106)
LV sys vol: 6 mL
Nuc Stress EF: 85 %
Peak HR: 105 {beats}/min
Rest HR: 78 {beats}/min
Rest Nuclear Isotope Dose: 10.6 mCi
SDS: 4
SRS: 2
SSS: 6
ST Depression (mm): 0 mm
Stress Nuclear Isotope Dose: 31.2 mCi
TID: 1.04

## 2022-09-15 MED ORDER — TECHNETIUM TC 99M TETROFOSMIN IV KIT
31.2000 | PACK | Freq: Once | INTRAVENOUS | Status: AC | PRN
  Administered 2022-09-15: 31.2 via INTRAVENOUS

## 2022-09-15 MED ORDER — TECHNETIUM TC 99M TETROFOSMIN IV KIT
10.6000 | PACK | Freq: Once | INTRAVENOUS | Status: AC | PRN
  Administered 2022-09-15: 10.6 via INTRAVENOUS

## 2022-09-15 MED ORDER — REGADENOSON 0.4 MG/5ML IV SOLN
0.4000 mg | Freq: Once | INTRAVENOUS | Status: AC
Start: 2022-09-15 — End: 2022-09-15
  Administered 2022-09-15: 0.4 mg via INTRAVENOUS

## 2022-09-17 ENCOUNTER — Other Ambulatory Visit: Payer: Self-pay | Admitting: Internal Medicine

## 2022-09-17 ENCOUNTER — Ambulatory Visit: Payer: Medicare Other

## 2022-09-17 ENCOUNTER — Other Ambulatory Visit: Payer: Self-pay

## 2022-09-17 VITALS — Ht 63.0 in | Wt 134.0 lb

## 2022-09-17 DIAGNOSIS — I700 Atherosclerosis of aorta: Secondary | ICD-10-CM

## 2022-09-17 DIAGNOSIS — Z780 Asymptomatic menopausal state: Secondary | ICD-10-CM | POA: Diagnosis not present

## 2022-09-17 DIAGNOSIS — Z0000 Encounter for general adult medical examination without abnormal findings: Secondary | ICD-10-CM | POA: Diagnosis not present

## 2022-09-17 DIAGNOSIS — C8318 Mantle cell lymphoma, lymph nodes of multiple sites: Secondary | ICD-10-CM

## 2022-09-17 NOTE — Patient Instructions (Addendum)
Ms. Rippentrop , Thank you for taking time to come for your Medicare Wellness Visit. I appreciate your ongoing commitment to your health goals. Please review the following plan we discussed and let me know if I can assist you in the future.   Referrals/Orders/Follow-Ups/Clinician Recommendations: You are up to date on all health maintenance.  Remember to call [facility] at [phone]) to schedule your bone density.  This is a list of the screening recommended for you and due dates:  Health Maintenance  Topic Date Due   Colon Cancer Screening  11/15/2022   Flu Shot  01/07/2023*   COVID-19 Vaccine (5 - 2023-24 season) 02/08/2023*   Medicare Annual Wellness Visit  09/17/2023   DTaP/Tdap/Td vaccine (2 - Td or Tdap) 04/29/2030   Pneumonia Vaccine  Completed   DEXA scan (bone density measurement)  Completed   Hepatitis C Screening  Completed   Zoster (Shingles) Vaccine  Completed   HPV Vaccine  Aged Out  *Topic was postponed. The date shown is not the original due date.    Advanced directives: (Declined) Advance directive discussed with you today. Even though you declined this today, please call our office should you change your mind, and we can give you the proper paperwork for you to fill out.  Next Medicare Annual Wellness Visit scheduled for next year: Yes  Preventive Care 59 Years and Older, Female Preventive care refers to lifestyle choices and visits with your health care provider that can promote health and wellness. What does preventive care include? A yearly physical exam. This is also called an annual well check. Dental exams once or twice a year. Routine eye exams. Ask your health care provider how often you should have your eyes checked. Personal lifestyle choices, including: Daily care of your teeth and gums. Regular physical activity. Eating a healthy diet. Avoiding tobacco and drug use. Limiting alcohol use. Practicing safe sex. Taking low-dose aspirin every  day. Taking vitamin and mineral supplements as recommended by your health care provider. What happens during an annual well check? The services and screenings done by your health care provider during your annual well check will depend on your age, overall health, lifestyle risk factors, and family history of disease. Counseling  Your health care provider may ask you questions about your: Alcohol use. Tobacco use. Drug use. Emotional well-being. Home and relationship well-being. Sexual activity. Eating habits. History of falls. Memory and ability to understand (cognition). Work and work Astronomer. Reproductive health. Screening  You may have the following tests or measurements: Height, weight, and BMI. Blood pressure. Lipid and cholesterol levels. These may be checked every 5 years, or more frequently if you are over 72 years old. Skin check. Lung cancer screening. You may have this screening every year starting at age 60 if you have a 30-pack-year history of smoking and currently smoke or have quit within the past 15 years. Fecal occult blood test (FOBT) of the stool. You may have this test every year starting at age 73. Flexible sigmoidoscopy or colonoscopy. You may have a sigmoidoscopy every 5 years or a colonoscopy every 10 years starting at age 14. Hepatitis C blood test. Hepatitis B blood test. Sexually transmitted disease (STD) testing. Diabetes screening. This is done by checking your blood sugar (glucose) after you have not eaten for a while (fasting). You may have this done every 1-3 years. Bone density scan. This is done to screen for osteoporosis. You may have this done starting at age 50. Mammogram. This may  be done every 1-2 years. Talk to your health care provider about how often you should have regular mammograms. Talk with your health care provider about your test results, treatment options, and if necessary, the need for more tests. Vaccines  Your health care  provider may recommend certain vaccines, such as: Influenza vaccine. This is recommended every year. Tetanus, diphtheria, and acellular pertussis (Tdap, Td) vaccine. You may need a Td booster every 10 years. Zoster vaccine. You may need this after age 49. Pneumococcal 13-valent conjugate (PCV13) vaccine. One dose is recommended after age 25. Pneumococcal polysaccharide (PPSV23) vaccine. One dose is recommended after age 20. Talk to your health care provider about which screenings and vaccines you need and how often you need them. This information is not intended to replace advice given to you by your health care provider. Make sure you discuss any questions you have with your health care provider. Document Released: 02/21/2015 Document Revised: 10/15/2015 Document Reviewed: 11/26/2014 Elsevier Interactive Patient Education  2017 [employer].  Fall Prevention in the Home Falls can cause injuries. They can happen to people of all ages. There are many things you can do to make your home safe and to help prevent falls. What can I do on the outside of my home? Regularly fix the edges of walkways and driveways and fix any cracks. Remove anything that might make you trip as you walk through a door, such as a raised step or threshold. Trim any bushes or trees on the path to your home. Use bright outdoor lighting. Clear any walking paths of anything that might make someone trip, such as rocks or tools. Regularly check to see if handrails are loose or broken. Make sure that both sides of any steps have handrails. Any raised decks and porches should have guardrails on the edges. Have any leaves, snow, or ice cleared regularly. Use sand or salt on walking paths during winter. Clean up any spills in your garage right away. This includes oil or grease spills. What can I do in the bathroom? Use night lights. Install grab bars by the toilet and in the tub and shower. Do not use towel bars as grab  bars. Use non-skid mats or decals in the tub or shower. If you need to sit down in the shower, use a plastic, non-slip stool. Keep the floor dry. Clean up any water that spills on the floor as soon as it happens. Remove soap buildup in the tub or shower regularly. Attach bath mats securely with double-sided non-slip rug tape. Do not have throw rugs and other things on the floor that can make you trip. What can I do in the bedroom? Use night lights. Make sure that you have a light by your bed that is easy to reach. Do not use any sheets or blankets that are too big for your bed. They should not hang down onto the floor. Have a firm chair that has side arms. You can use this for support while you get dressed. Do not have throw rugs and other things on the floor that can make you trip. What can I do in the kitchen? Clean up any spills right away. Avoid walking on wet floors. Keep items that you use a lot in easy-to-reach places. If you need to reach something above you, use a strong step stool that has a grab bar. Keep electrical cords out of the way. Do not use floor polish or wax that makes floors slippery. If you must use  wax, use non-skid floor wax. Do not have throw rugs and other things on the floor that can make you trip. What can I do with my stairs? Do not leave any items on the stairs. Make sure that there are handrails on both sides of the stairs and use them. Fix handrails that are broken or loose. Make sure that handrails are as long as the stairways. Check any carpeting to make sure that it is firmly attached to the stairs. Fix any carpet that is loose or worn. Avoid having throw rugs at the top or bottom of the stairs. If you do have throw rugs, attach them to the floor with carpet tape. Make sure that you have a light switch at the top of the stairs and the bottom of the stairs. If you do not have them, ask someone to add them for you. What else can I do to help prevent  falls? Wear shoes that: Do not have high heels. Have rubber bottoms. Are comfortable and fit you well. Are closed at the toe. Do not wear sandals. If you use a stepladder: Make sure that it is fully opened. Do not climb a closed stepladder. Make sure that both sides of the stepladder are locked into place. Ask someone to hold it for you, if possible. Clearly mark and make sure that you can see: Any grab bars or handrails. First and last steps. Where the edge of each step is. Use tools that help you move around (mobility aids) if they are needed. These include: Canes. Walkers. Scooters. Crutches. Turn on the lights when you go into a dark area. Replace any light bulbs as soon as they burn out. Set up your furniture so you have a clear path. Avoid moving your furniture around. If any of your floors are uneven, fix them. If there are any pets around you, be aware of where they are. Review your medicines with your doctor. Some medicines can make you feel dizzy. This can increase your chance of falling. Ask your doctor what other things that you can do to help prevent falls. This information is not intended to replace advice given to you by your health care provider. Make sure you discuss any questions you have with your health care provider. Document Released: 11/21/2008 Document Revised: 07/03/2015 Document Reviewed: 03/01/2014 Elsevier Interactive Patient Education  2017 [employer] Managing Pain Without Opioids Opioids are strong medicines used to treat moderate to severe pain. For some people, especially those who have long-term (chronic) pain, opioids may not be the best choice for pain management due to: Side effects like nausea, constipation, and sleepiness. The risk of addiction (opioid use disorder). The longer you take opioids, the greater your risk of addiction. Pain that lasts for more than 3 months is called chronic pain. Managing chronic pain usually requires more than one  approach and is often provided by a team of health care providers working together (multidisciplinary approach). Pain management may be done at a pain management center or pain clinic. How to manage pain without the use of opioids Use non-opioid medicines Non-opioid medicines for pain may include: Over-the-counter or prescription non-steroidal anti-inflammatory drugs (NSAIDs). These may be the first medicines used for pain. They work well for muscle and bone pain, and they reduce swelling. Acetaminophen. This over-the-counter medicine may work well for milder pain but not swelling. Antidepressants. These may be used to treat chronic pain. A certain type of antidepressant (tricyclics) is often used. These medicines are given in  lower doses for pain than when used for depression. Anticonvulsants. These are usually used to treat seizures but may also reduce nerve (neuropathic) pain. Muscle relaxants. These relieve pain caused by sudden muscle tightening (spasms). You may also use a pain medicine that is applied to the skin as a patch, cream, or gel (topical analgesic), such as a numbing medicine. These may cause fewer side effects than medicines taken by mouth. Do certain therapies as directed Some therapies can help with pain management. They include: Physical therapy. You will do exercises to gain strength and flexibility. A physical therapist may teach you exercises to move and stretch parts of your body that are weak, stiff, or painful. You can learn these exercises at physical therapy visits and practice them at home. Physical therapy may also involve: Massage. Heat wraps or applying heat or cold to affected areas. Electrical signals that interrupt pain signals (transcutaneous electrical nerve stimulation, TENS). Weak lasers that reduce pain and swelling (low-level laser therapy). Signals from your body that help you learn to regulate pain (biofeedback). Occupational therapy. This helps you to  learn ways to function at home and work with less pain. Recreational therapy. This involves trying new activities or hobbies, such as a physical activity or drawing. Mental health therapy, including: Cognitive behavioral therapy (CBT). This helps you learn coping skills for dealing with pain. Acceptance and commitment therapy (ACT) to change the way you think and react to pain. Relaxation therapies, including muscle relaxation exercises and mindfulness-based stress reduction. Pain management counseling. This may be individual, family, or group counseling.  Receive medical treatments Medical treatments for pain management include: Nerve block injections. These may include a pain blocker and anti-inflammatory medicines. You may have injections: Near the spine to relieve chronic back or neck pain. Into joints to relieve back or joint pain. Into nerve areas that supply a painful area to relieve body pain. Into muscles (trigger point injections) to relieve some painful muscle conditions. A medical device placed near your spine to help block pain signals and relieve nerve pain or chronic back pain (spinal cord stimulation device). Acupuncture. Follow these instructions at home Medicines Take over-the-counter and prescription medicines only as told by your health care provider. If you are taking pain medicine, ask your health care providers about possible side effects to watch out for. Do not drive or use heavy machinery while taking prescription opioid pain medicine. Lifestyle  Do not use drugs or alcohol to reduce pain. If you drink alcohol, limit how much you have to: 0-1 drink a day for women who are not pregnant. 0-2 drinks a day for men. Know how much alcohol is in a drink. In the [place] one drink equals one 12 oz bottle of beer (355 mL), one 5 oz glass of wine (148 mL), or one 1 oz glass of hard liquor (44 mL). Do not use any products that contain nicotine or tobacco. These products  include cigarettes, chewing tobacco, and vaping devices, such as e-cigarettes. If you need help quitting, ask your health care provider. Eat a healthy diet and maintain a healthy weight. Poor diet and excess weight may make pain worse. Eat foods that are high in fiber. These include fresh fruits and vegetables, whole grains, and beans. Limit foods that are high in fat and processed sugars, such as fried and sweet foods. Exercise regularly. Exercise lowers stress and may help relieve pain. Ask your health care provider what activities and exercises are safe for you. If your health care  provider approves, join an exercise class that combines movement and stress reduction. Examples include yoga and tai chi. Get enough sleep. Lack of sleep may make pain worse. Lower stress as much as possible. Practice stress reduction techniques as told by your therapist. General instructions Work with all your pain management providers to find the treatments that work best for you. You are an important member of your pain management team. There are many things you can do to reduce pain on your own. Consider joining an online or in-person support group for people who have chronic pain. Keep all follow-up visits. This is important. Where to find more information You can find more information about managing pain without opioids from: American Academy of Pain Medicine: painmed.org Institute for Chronic Pain: instituteforchronicpain.org American Chronic Pain Association: theacpa.org Contact a health care provider if: You have side effects from pain medicine. Your pain gets worse or does not get better with treatments or home therapy. You are struggling with anxiety or depression. Summary Many types of pain can be managed without opioids. Chronic pain may respond better to pain management without opioids. Pain is best managed when you and a team of health care providers work together. Pain management without opioids  may include non-opioid medicines, medical treatments, physical therapy, mental health therapy, and lifestyle changes. Tell your health care providers if your pain gets worse or is not being managed well enough. This information is not intended to replace advice given to you by your health care provider. Make sure you discuss any questions you have with your health care provider. Document Revised: 05/07/2020 Document Reviewed: 05/07/2020 Elsevier Patient Education  2024 [employer].

## 2022-09-17 NOTE — Progress Notes (Signed)
Subjective:   Brooke [name] is a 77 y.o. female who presents for Medicare Annual (Subsequent) preventive examination.  Visit Complete: Virtual  I connected with  Brooke [name] on 09/17/22 by a audio enabled telemedicine application and verified that I am speaking with the correct person using two identifiers.  Patient Location: Home  Provider Location: Home Office  I discussed the limitations of evaluation and management by telemedicine. The patient expressed understanding and agreed to proceed.  Vital Signs: Vital signs are patient reported.   Review of Systems    Cardiac Risk Factors include: advanced age (>13men, >72 women);dyslipidemia     Objective:    Today's Vitals   09/17/22 0815  Weight: 134 lb (60.8 kg)  Height: 5\' 3"  (1.6 m)   Body mass index is 23.74 kg/m.     09/17/2022    8:22 AM 09/21/2021   10:27 AM 10/24/2020    2:21 PM 08/22/2020    2:04 PM 03/11/2020   10:12 AM 02/20/2020    6:00 AM 01/07/2020   11:15 AM  Advanced Directives  Does Patient Have a Medical Advance Directive? No No No No No No No  Would patient like information on creating a medical advance directive? No - Patient declined No - Patient declined No - Patient declined No - Patient declined No - Patient declined No - Patient declined No - Patient declined    Current Medications (verified) Outpatient Encounter Medications as of 09/17/2022  Medication Sig   ALPRAZolam (XANAX) 0.5 MG tablet Take 0.5 mg by mouth 3 (three) times daily.   ASPIRIN LOW DOSE 81 MG tablet TAKE 1 TABLET BY MOUTH EVERY DAY   Cholecalciferol (VITAMIN D3) 5000 units CAPS Take 1 capsule by mouth daily.   fluticasone (FLONASE) 50 MCG/ACT nasal spray Place 1 spray into both nostrils daily as needed for allergies.   HYDROcodone-acetaminophen (NORCO) 10-325 MG tablet Take 1 tablet by mouth every 8 (eight) hours as needed. (Patient taking differently: Take 1 tablet by mouth every 8 (eight) hours as  needed for moderate pain or severe pain.)   omeprazole (PRILOSEC) 20 MG capsule TAKE 1 CAPSULE BY MOUTH DAILY 30 MINUTES BEFORE MORNING MEAL (Patient taking differently: Take 20 mg by mouth daily.)   predniSONE (DELTASONE) 20 MG tablet Take 1 tablet (20 mg total) by mouth daily with breakfast. For 5 days after each Rituxan infusion   rosuvastatin (CRESTOR) 5 MG tablet TAKE 1 TABLET BY MOUTH ONCE DAILY   vitamin B-12 (CYANOCOBALAMIN) 500 MCG tablet Take 500 mcg by mouth daily.   No facility-administered encounter medications on file as of 09/17/2022.    Allergies (verified) Patient has no known allergies.   History: Past Medical History:  Diagnosis Date   Allergy    Arthritis    Back pain    Complication of anesthesia    bp dropped yrs ago, recent surgeries went ok   Dysrhythmia    occ, no meds for   Gastritis    GERD (gastroesophageal reflux disease)    H. pylori infection    Headache    migraines occ   Hiatal hernia    Hx of adenomatous colonic polyps 11/30/2017   Hyperlipidemia    Hypothyroidism 1970's   hx of years ago, no meds for    mantle cell lymphoma    Pulmonary nodule    Shortness of breath dyspnea    with exertion, pt told comes from acid reflux   Past Surgical History:  Procedure Laterality Date  APPENDECTOMY     COLONOSCOPY  12/07/2013   DILATION AND CURETTAGE OF UTERUS     EYE SURGERY Bilateral 07/2022   Cataract surgery   LAPAROSCOPIC CHOLECYSTECTOMY SINGLE SITE WITH INTRAOPERATIVE CHOLANGIOGRAM N/A 10/28/2015   Procedure: LAPAROSCOPIC CHOLECYSTECTOMY WITH INTRAOPERATIVE CHOLANGIOGRAM;  Surgeon: Darnell Level, MD;  Location: [facility];  Service: General;  Laterality: N/A;   LAPAROSCOPY  yrs ago   x 2   TMJ ARTHROSCOPY     TOTAL ABDOMINAL HYSTERECTOMY     complete   Family History  Problem Relation Age of Onset   Heart failure Mother    Colon cancer Father        early 54's   Ovarian cancer Sister    Colon polyps Sister    Kidney cancer Brother     Colon polyps Brother    Lung cancer Brother    Colon cancer Paternal Grandfather        unknown age of onset   Breast cancer Maternal Aunt    Breast cancer Cousin    Colon cancer Cousin    Rectal cancer Neg Hx    Stomach cancer Neg Hx    Esophageal cancer Neg Hx    Social History   Socioeconomic History   Marital status: Widowed    Spouse name: Not on file   Number of children: 2   Years of education: Not on file   Highest education level: Not on file  Occupational History   Occupation: Retired  Tobacco Use   Smoking status: Never   Smokeless tobacco: Never  Vaping Use   Vaping status: Never Used  Substance and Sexual Activity   Alcohol use: Yes    Alcohol/week: 4.0 - 5.0 standard drinks of alcohol    Types: 4 - 5 Cans of beer per week    Comment: 4-5 beers weekly-occ per pt   Drug use: No   Sexual activity: Not on file  Other Topics Concern   Not on file  Social History Narrative   1 child deceased   1 adopted daughter   Mostly lives alone, daughter will being staying soon.  She will have her first grand baby.   Social Determinants of Health   Financial Resource Strain: Low Risk  (09/17/2022)   Overall Financial Resource Strain (CARDIA)    Difficulty of Paying Living Expenses: Not very hard  Food Insecurity: No Food Insecurity (09/17/2022)   Hunger Vital Sign    Worried About Running Out of Food in the Last Year: Never true    Ran Out of Food in the Last Year: Never true  Transportation Needs: No Transportation Needs (09/17/2022)   PRAPARE - Administrator, Civil Service (Medical): No    Lack of Transportation (Non-Medical): No  Physical Activity: Insufficiently Active (09/17/2022)   Exercise Vital Sign    Days of Exercise per Week: 3 days    Minutes of Exercise per Session: 30 min  Stress: No Stress Concern Present (09/17/2022)   [employer] of Occupational Health - Occupational Stress Questionnaire    Feeling of Stress : Only a little  Social  Connections: Unknown (09/17/2022)   Social Connection and Isolation Panel [NHANES]    Frequency of Communication with Friends and Family: More than three times a week    Frequency of Social Gatherings with Friends and Family: Twice a week    Attends Religious Services: More than 4 times per year    Active Member of Clubs or Organizations: No  Attends Banker Meetings: Never    Marital Status: Not on file    Tobacco Counseling Counseling given: Not Answered   Clinical Intake:  Pre-visit preparation completed: Yes  Pain : No/denies pain     BMI - recorded: 23.74 Nutritional Status: BMI of 19-24  Normal Nutritional Risks: None Diabetes: No     Interpreter Needed?: No  Information entered by ::  , RMA   Activities of Daily Living    09/17/2022    8:17 AM 09/21/2021   10:27 AM  In your present state of health, do you have any difficulty performing the following activities:  Hearing? 0 0  Vision? 0 0  Difficulty concentrating or making decisions? 0 0  Walking or climbing stairs? 0 0  Dressing or bathing? 0 0  Doing errands, shopping? 0 0  Preparing Food and eating ? N N  Using the Toilet? N N  In the past six months, have you accidently leaked urine? N N  Do you have problems with loss of bowel control? N N  Managing your Medications? N N  Managing your Finances? N N  Housekeeping or managing your Housekeeping? N N    Patient Care Team: Etta Grandchild, MD as PCP - General (Internal Medicine)  Indicate any recent Medical Services you may have received from other than [facility] providers in the past year (date may be approximate).     Assessment:   This is a routine wellness examination for [place].  Hearing/Vision screen Hearing Screening - Comments:: Denies hearing difficulties   Vision Screening - Comments:: Denies vision issues.   Dietary issues and exercise activities discussed:     Goals Addressed   None   Depression  Screen    09/17/2022    8:30 AM 09/21/2021   10:21 AM 06/10/2021    2:55 PM 04/28/2020    1:51 PM  PHQ 2/9 Scores  PHQ - 2 Score 0 1 3 1   PHQ- 9 Score 0  7     Fall Risk    09/17/2022    8:22 AM 09/21/2021   10:27 AM  Fall Risk   Falls in the past year? 0 0  Number falls in past yr: 0 0  Injury with Fall? 0 0  Risk for fall due to : No Fall Risks No Fall Risks  Follow up Falls prevention discussed Falls evaluation completed    MEDICARE RISK AT HOME:  Medicare Risk at Home - 09/17/22 2130     Any stairs in or around the home? Yes    If so, are there any without handrails? Yes    Home free of loose throw rugs in walkways, pet beds, electrical cords, etc? Yes    Adequate lighting in your home to reduce risk of falls? Yes    Life alert? No    Use of a cane, walker or w/c? No    Grab bars in the bathroom? No    Shower chair or bench in shower? No    Elevated toilet seat or a handicapped toilet? No             TIMED UP AND GO:  Was the test performed?  No    Cognitive Function:        09/17/2022    8:23 AM 09/21/2021   10:29 AM  6CIT Screen  What Year? 0 points 0 points  What month? 0 points 0 points  What time? 0 points 0 points  Count back  from 20 0 points 0 points  Months in reverse 0 points 0 points  Repeat phrase 0 points 0 points  Total Score 0 points 0 points    Immunizations Immunization History  Administered Date(s) Administered   COVID-19, mRNA, vaccine(Comirnaty)12 years and older 10/28/2021   Fluad Quad(high Dose 65+) 02/12/2020   Influenza, High Dose Seasonal PF 10/28/2021   Influenza-Unspecified 11/08/2013, 11/11/2014   PFIZER(Purple Top)SARS-COV-2 Vaccination 03/17/2019, 04/11/2019, 02/04/2020   PNEUMOCOCCAL CONJUGATE-20 04/28/2020   Tdap 04/28/2020   Zoster Recombinant(Shingrix) 06/15/2021, 09/30/2021    TDAP status: Up to date  Flu Vaccine status: Up to date  Pneumococcal vaccine status: Up to date  Covid-19 vaccine status: Completed  vaccines  Qualifies for Shingles Vaccine? Yes   Zostavax completed Yes   Shingrix Completed?: Yes  Screening Tests Health Maintenance  Topic Date Due   COVID-19 Vaccine (5 - 2023-24 season) 12/23/2021   INFLUENZA VACCINE  09/09/2022   Colonoscopy  11/15/2022   Medicare Annual Wellness (AWV)  09/17/2023   DTaP/Tdap/Td (2 - Td or Tdap) 04/29/2030   Pneumonia Vaccine 46+ Years old  Completed   DEXA SCAN  Completed   Hepatitis C Screening  Completed   Zoster Vaccines- Shingrix  Completed   HPV VACCINES  Aged Out    Health Maintenance  Health Maintenance Due  Topic Date Due   COVID-19 Vaccine (5 - 2023-24 season) 12/23/2021   INFLUENZA VACCINE  09/09/2022   Colonoscopy  11/15/2022    Colorectal cancer screening: Type of screening: Colonoscopy. Completed 11/14/2017. Repeat every 5 years  Mammogram status: Completed 08/06/2022. Repeat every year  Bone Density status: Ordered 09/27/2022. Pt provided with contact info and advised to call to schedule appt.  Lung Cancer Screening: (Low Dose CT Chest recommended if Age 87-80 years, 20 pack-year currently smoking OR have quit w/in 15years.) does not qualify.   Lung Cancer Screening Referral: N/a  Additional Screening:  Hepatitis C Screening: does qualify; Completed 09/30/2019  Vision Screening: Recommended annual ophthalmology exams for early detection of glaucoma and other disorders of the eye. Is the patient up to date with their annual eye exam?  Yes  Who is the provider or what is the name of the office in which the patient attends annual eye exams? [facility]/ Dr. Harriette Bouillon &  Dr. Vonna Kotyk If pt is not established with a provider, would they like to be referred to a provider to establish care? No .   Dental Screening: Recommended annual dental exams for proper oral hygiene   Community Resource Referral / Chronic Care Management: CRR required this visit?  No   CCM required this visit?  No     Plan:     I have  personally reviewed and noted the following in the patient's chart:   Medical and social history Use of alcohol, tobacco or illicit drugs  Current medications and supplements including opioid prescriptions. Patient is currently taking opioid prescriptions. Information provided to patient regarding non-opioid alternatives. Patient advised to discuss non-opioid treatment plan with their provider. Functional ability and status Nutritional status Physical activity Advanced directives List of other physicians Hospitalizations, surgeries, and ER visits in previous 12 months Vitals Screenings to include cognitive, depression, and falls Referrals and appointments  In addition, I have reviewed and discussed with patient certain preventive protocols, quality metrics, and best practice recommendations. A written personalized care plan for preventive services as well as general preventive health recommendations were provided to patient.      L , CMA  09/17/2022   After Visit Summary: (MyChart) Due to this being a telephonic visit, the after visit summary with patients personalized plan was offered to patient via MyChart   Nurse Notes: Patient is wanting a DEXA this year.  The last one was in 2017, results of [facility].  Referral has been placed today.  She is scheduled for a colonoscopy in October.  Patient had no other concerns today.

## 2022-09-18 ENCOUNTER — Other Ambulatory Visit: Payer: Self-pay

## 2022-09-20 ENCOUNTER — Inpatient Hospital Stay: Payer: Medicare Other | Admitting: Hematology

## 2022-09-20 ENCOUNTER — Inpatient Hospital Stay: Payer: Medicare Other

## 2022-09-20 ENCOUNTER — Other Ambulatory Visit: Payer: Self-pay

## 2022-09-20 ENCOUNTER — Inpatient Hospital Stay: Payer: Medicare Other | Attending: Hematology

## 2022-09-20 VITALS — BP 125/80 | HR 85 | Temp 98.3°F | Resp 20 | Wt 137.3 lb

## 2022-09-20 VITALS — BP 136/76 | HR 64 | Temp 98.1°F | Resp 16

## 2022-09-20 DIAGNOSIS — C8318 Mantle cell lymphoma, lymph nodes of multiple sites: Secondary | ICD-10-CM

## 2022-09-20 DIAGNOSIS — Z79899 Other long term (current) drug therapy: Secondary | ICD-10-CM | POA: Insufficient documentation

## 2022-09-20 DIAGNOSIS — D702 Other drug-induced agranulocytosis: Secondary | ICD-10-CM

## 2022-09-20 DIAGNOSIS — Z5112 Encounter for antineoplastic immunotherapy: Secondary | ICD-10-CM | POA: Insufficient documentation

## 2022-09-20 DIAGNOSIS — Z7189 Other specified counseling: Secondary | ICD-10-CM

## 2022-09-20 LAB — CMP (CANCER CENTER ONLY)
ALT: 11 U/L (ref 0–44)
AST: 19 U/L (ref 15–41)
Albumin: 4.4 g/dL (ref 3.5–5.0)
Alkaline Phosphatase: 60 U/L (ref 38–126)
Anion gap: 7 (ref 5–15)
BUN: 13 mg/dL (ref 8–23)
CO2: 26 mmol/L (ref 22–32)
Calcium: 9.3 mg/dL (ref 8.9–10.3)
Chloride: 110 mmol/L (ref 98–111)
Creatinine: 0.89 mg/dL (ref 0.44–1.00)
GFR, Estimated: >60 mL/min (ref >60)
Glucose, Bld: 95 mg/dL (ref 70–99)
Potassium: 3.7 mmol/L (ref 3.5–5.1)
Sodium: 143 mmol/L (ref 135–145)
Total Bilirubin: 0.7 mg/dL (ref 0.3–1.2)
Total Protein: 6.4 g/dL — ABNORMAL LOW (ref 6.5–8.1)

## 2022-09-20 LAB — CBC WITH DIFFERENTIAL (CANCER CENTER ONLY)
Abs Immature Granulocytes: 0.04 10*3/uL (ref 0.00–0.07)
Basophils Absolute: 0 10*3/uL (ref 0.0–0.1)
Basophils Relative: 1 %
Eosinophils Absolute: 0.1 10*3/uL (ref 0.0–0.5)
Eosinophils Relative: 1 %
HCT: 32.3 % — ABNORMAL LOW (ref 36.0–46.0)
Hemoglobin: 11 g/dL — ABNORMAL LOW (ref 12.0–15.0)
Immature Granulocytes: 1 %
Lymphocytes Relative: 15 %
Lymphs Abs: 1.1 10*3/uL (ref 0.7–4.0)
MCH: 32.7 pg (ref 26.0–34.0)
MCHC: 34.1 g/dL (ref 30.0–36.0)
MCV: 96.1 fL (ref 80.0–100.0)
Monocytes Absolute: 0.9 10*3/uL (ref 0.1–1.0)
Monocytes Relative: 13 %
Neutro Abs: 5.2 10*3/uL (ref 1.7–7.7)
Neutrophils Relative %: 69 %
Platelet Count: 212 10*3/uL (ref 150–400)
RBC: 3.36 MIL/uL — ABNORMAL LOW (ref 3.87–5.11)
RDW: 13.1 % (ref 11.5–15.5)
WBC Count: 7.4 10*3/uL (ref 4.0–10.5)
nRBC: 0 % (ref 0.0–0.2)

## 2022-09-20 LAB — LACTATE DEHYDROGENASE: LDH: 193 U/L — ABNORMAL HIGH (ref 98–192)

## 2022-09-20 MED ORDER — DIPHENHYDRAMINE HCL 25 MG PO CAPS
50.0000 mg | ORAL_CAPSULE | Freq: Once | ORAL | Status: AC
  Administered 2022-09-20: 50 mg via ORAL
  Filled 2022-09-20: qty 2

## 2022-09-20 MED ORDER — ACETAMINOPHEN 325 MG PO TABS
650.0000 mg | ORAL_TABLET | Freq: Once | ORAL | Status: AC
  Administered 2022-09-20: 650 mg via ORAL
  Filled 2022-09-20: qty 2

## 2022-09-20 MED ORDER — SODIUM CHLORIDE 0.9 % IV SOLN: Freq: Once | INTRAVENOUS | Status: AC

## 2022-09-20 MED ORDER — FAMOTIDINE IN NACL 20-0.9 MG/50ML-% IV SOLN
20.0000 mg | Freq: Once | INTRAVENOUS | Status: AC
  Administered 2022-09-20: 20 mg via INTRAVENOUS
  Filled 2022-09-20: qty 50

## 2022-09-20 MED ORDER — MONTELUKAST SODIUM 10 MG PO TABS
10.0000 mg | ORAL_TABLET | Freq: Once | ORAL | Status: AC
  Administered 2022-09-20: 10 mg via ORAL
  Filled 2022-09-20: qty 1

## 2022-09-20 MED ORDER — SODIUM CHLORIDE 0.9 % IV SOLN
12.0000 mg | Freq: Once | INTRAVENOUS | Status: AC
  Administered 2022-09-20: 12 mg via INTRAVENOUS
  Filled 2022-09-20: qty 1.2

## 2022-09-20 MED ORDER — SODIUM CHLORIDE 0.9 % IV SOLN
375.0000 mg/m2 | Freq: Once | INTRAVENOUS | Status: AC
  Administered 2022-09-20: 600 mg via INTRAVENOUS
  Filled 2022-09-20: qty 50

## 2022-09-20 NOTE — Progress Notes (Signed)
Patient seen by Dr. Kale  Vitals are within treatment parameters.  Labs reviewed: and are within treatment parameters.  Per physician team, patient is ready for treatment and there are NO modifications to the treatment plan.  

## 2022-09-20 NOTE — Patient Instructions (Signed)
[facility]   Discharge Instructions: Thank you for choosing [facility] to provide your oncology and hematology care.   If you have a lab appointment with the Cancer Center, please go directly to the Cancer Center and check in at the registration area.   Wear comfortable clothing and clothing appropriate for easy access to any Portacath or PICC line.   We strive to give you quality time with your provider. You may need to reschedule your appointment if you arrive late (15 or more minutes).  Arriving late affects you and other patients whose appointments are after yours.  Also, if you miss three or more appointments without notifying the office, you may be dismissed from the clinic at the provider's discretion.      For prescription refill requests, have your pharmacy contact our office and allow 72 hours for refills to be completed.    Today you received the following chemotherapy and/or immunotherapy agents: Rituximab (Rituxan)      To help prevent nausea and vomiting after your treatment, we encourage you to take your nausea medication as directed.  BELOW ARE SYMPTOMS THAT SHOULD BE REPORTED IMMEDIATELY: *FEVER GREATER THAN 100.4 F (38 C) OR HIGHER *CHILLS OR SWEATING *NAUSEA AND VOMITING THAT IS NOT CONTROLLED WITH YOUR NAUSEA MEDICATION *UNUSUAL SHORTNESS OF BREATH *UNUSUAL BRUISING OR BLEEDING *URINARY PROBLEMS (pain or burning when urinating, or frequent urination) *BOWEL PROBLEMS (unusual diarrhea, constipation, pain near the anus) TENDERNESS IN MOUTH AND THROAT WITH OR WITHOUT PRESENCE OF ULCERS (sore throat, sores in mouth, or a toothache) UNUSUAL RASH, SWELLING OR PAIN  UNUSUAL VAGINAL DISCHARGE OR ITCHING   Items with * indicate a potential emergency and should be followed up as soon as possible or go to the Emergency Department if any problems should occur.  Please show the CHEMOTHERAPY ALERT CARD or IMMUNOTHERAPY ALERT  CARD at check-in to the Emergency Department and triage nurse.  Should you have questions after your visit or need to cancel or reschedule your appointment, please contact [facility]  Dept: [phone]  and follow the prompts.  Office hours are 8:00 a.m. to 4:30 p.m. Monday - Friday. Please note that voicemails left after 4:00 p.m. may not be returned until the following business day.  We are closed weekends and major holidays. You have access to a nurse at all times for urgent questions. Please call the main number to the clinic Dept: [phone] and follow the prompts.   For any non-urgent questions, you may also contact your provider using MyChart. We now offer e-Visits for anyone 25 and older to request care online for non-urgent symptoms. For details visit [email or web address].   Also download the MyChart app! Go to the app store, search "MyChart", open the app, select , and log in with your MyChart username and password.

## 2022-09-20 NOTE — Progress Notes (Signed)
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   Patient was last seen by me on 06/21/2022 and she complained of mild skin rash/color change near her face due to her Rituxan treatment, which improved with Prednisone. She was doing well overall.   Patient notes she has been doing well overall without any new or severe medical concerns since our last visit. She tolerated her previous cycle of her treatment well with mild toxicity. Her toxicity includes mild skin rash/color change near her face. She notes her skin rash is controlled with antihistamine and Prednisone.   She denies any new infection issues, fever, chills, night sweats, unexpected weight loss, chest pain, back pain, abdominal pain, abnormal bleeding, new skin lump/bump, or leg swelling.   Patient notes she had a stress test  since our last visit which did not show any abnormalities.    REVIEW OF SYSTEMS:   10 Point review of Systems was done is negative except as noted above.  Past Medical History:  Diagnosis Date   Allergy    Arthritis    Back pain    Complication of anesthesia    bp dropped yrs ago, recent surgeries went Brooke   Dysrhythmia    occ, no meds for   Gastritis    GERD (gastroesophageal reflux disease)    H. pylori infection    Headache    migraines occ   Hiatal hernia    Hx of adenomatous colonic polyps 11/30/2017   Hyperlipidemia    Hypothyroidism 1970's   hx of years ago, no meds for    mantle cell lymphoma    Pulmonary nodule    Shortness of breath dyspnea    with exertion, pt told comes from acid reflux    . Past Surgical History:  Procedure Laterality Date   APPENDECTOMY     COLONOSCOPY  12/07/2013   DILATION AND CURETTAGE OF UTERUS     EYE SURGERY Bilateral 07/2022   Cataract surgery   LAPAROSCOPIC CHOLECYSTECTOMY SINGLE SITE WITH INTRAOPERATIVE CHOLANGIOGRAM N/A 10/28/2015   Procedure: LAPAROSCOPIC CHOLECYSTECTOMY WITH INTRAOPERATIVE CHOLANGIOGRAM;  Surgeon: Darnell Level, Brooke [name];  Location: [facility];  Service: General;  Laterality: N/A;   LAPAROSCOPY  yrs ago   x 2   TMJ ARTHROSCOPY     TOTAL ABDOMINAL HYSTERECTOMY     complete    . Social History   Tobacco Use   Smoking status: Never   Smokeless tobacco: Never  Vaping Use  Vaping status: Never Used  Substance Use Topics   Alcohol use: Yes    Alcohol/week: 4.0 - 5.0 standard drinks of alcohol    Types: 4 - 5 Cans of beer per week    Comment: 4-5 beers weekly-occ per pt   Drug use: No    ALLERGIES:  has No Known Allergies.  MEDICATIONS:  Current Outpatient Medications  Medication Sig Dispense Refill   ALPRAZolam (XANAX) 0.5 MG tablet Take 0.5 mg by mouth 3 (three) times daily.     ASPIRIN LOW DOSE 81 MG tablet TAKE 1 TABLET BY MOUTH EVERY DAY 90 tablet 1   Cholecalciferol (VITAMIN D3) 5000 units CAPS Take  1 capsule by mouth daily.     fluticasone (FLONASE) 50 MCG/ACT nasal spray Place 1 spray into both nostrils daily as needed for allergies.     HYDROcodone-acetaminophen (NORCO) 10-325 MG tablet Take 1 tablet by mouth every 8 (eight) hours as needed. (Patient taking differently: Take 1 tablet by mouth every 8 (eight) hours as needed for moderate pain or severe pain.) 30 tablet 0   omeprazole (PRILOSEC) 20 MG capsule TAKE 1 CAPSULE BY MOUTH DAILY 30 MINUTES BEFORE MORNING MEAL 90 capsule 1   predniSONE (DELTASONE) 20 MG tablet Take 1 tablet (20 mg total) by mouth daily with breakfast. For 5 days after each Rituxan infusion 5 tablet 5   rosuvastatin (CRESTOR) 5 MG tablet TAKE 1 TABLET BY MOUTH ONCE DAILY 90 tablet 1   vitamin B-12 (CYANOCOBALAMIN) 500 MCG tablet Take 500 mcg by mouth daily.     No current facility-administered medications for this visit.    PHYSICAL EXAMINATION: .BP 125/80   Pulse 85   Temp 98.3 F (36.8 C)   Resp 20   Wt 137 lb 4.8 oz (62.3 kg)   SpO2 99%   BMI 24.32 kg/m  NAD GENERAL:alert, in no acute distress and comfortable SKIN: no acute rashes, no significant lesions EYES: conjunctiva are pink and non-injected, sclera anicteric NECK: supple, no JVD LYMPH:  no palpable lymphadenopathy in the cervical, axillary or inguinal regions LUNGS: clear to auscultation b/l with normal respiratory effort HEART: regular rate & rhythm ABDOMEN:  normoactive bowel sounds , non tender, not distended. Extremity: no pedal edema PSYCH: alert & oriented x 3 with fluent speech NEURO: no focal motor/sensory deficits  LABORATORY DATA:   I have reviewed the data as listed      Latest Ref Rng & Units 09/20/2022   11:26 AM 06/21/2022   11:12 AM 03/22/2022   11:14 AM  CBC  WBC 4.0 - 10.5 K/uL 7.4  5.0  6.5   Hemoglobin 12.0 - 15.0 g/dL 16.1  09.6  04.5   Hematocrit 36.0 - 46.0 % 32.3  35.4  32.0   Platelets 150 - 400 K/uL 212  193  205     .    Latest Ref Rng & Units  09/20/2022   11:26 AM 06/21/2022   11:12 AM 03/22/2022   11:14 AM  CMP  Glucose 70 - 99 mg/dL 95  84  95   BUN 8 - 23 mg/dL 13  12  14    Creatinine 0.44 - 1.00 mg/dL 4.09  8.11  9.14   Sodium 135 - 145 mmol/L 143  144  142   Potassium 3.5 - 5.1 mmol/L 3.7  3.7  3.8   Chloride 98 - 111 mmol/L 110  108  108   CO2 22 - 32 mmol/L 26  27  29  Calcium 8.9 - 10.3 mg/dL 9.3  9.6  9.3   Total Protein 6.5 - 8.1 g/dL 6.4  6.7  6.1   Total Bilirubin 0.3 - 1.2 mg/dL 0.7  0.6  0.7   Alkaline Phos 38 - 126 U/L 60  68  66   AST 15 - 41 U/L 19  19  14    ALT 0 - 44 U/L 11  9  8     . Lab Results  Component Value Date   LDH 193 (H) 09/20/2022   RADIOGRAPHIC STUDIES: I have personally reviewed the radiological images as listed and agreed with the findings in the report. MYOCARDIAL PERFUSION IMAGING  Result Date: 09/15/2022   The study is normal. The study is low risk.   No ST deviation was noted.   Left ventricular function is normal. Nuclear stress EF: 85%. The left ventricular ejection fraction is hyperdynamic (>65%). End diastolic cavity size is normal.     ASSESSMENT & PLAN:   77 yo with   1) Stage III/IV Mantle cell lymphoma 01/11/2020 PET/CT [phone]) revealed no visible disease, continued resolution. 2) s/p hypersensitivity reaction to Rapid Rituxan. - rash and low grade fever -  resolved with solumedrol/tylenol, benadryl and famotidine   PLAN: -Discussed lab results from today, 09/20/2022, with the patient. CBC shows slightly decreased hemoglobin at 11.0 g/dL and slightly decreased hematocrit at 32.3%. CMP is stable overall.   -Patient has no clinical symptoms suggestive of mantle cell lymphoma at this time. -Patient did have mild toxicities from her last dose of Rituxan mx with post Rituxan claritin/famotidine and prednisone -Patient can continue Rituxan treatment today without any dose modification.  -Continue her B12 and vitamin D supplements.   FOLLOW-UP: Please schedule next 2  doses of maintenance Rituxan every 90 days along with labs and Brooke [name] visits as per integrated scheduling   The total time spent in the appointment was 25 minutes* .  All of the patient's questions were answered with apparent satisfaction. The patient knows to call the clinic with any problems, questions or concerns.   Brooke Lora MD MS AAHIVMS [facility] Hematology/Oncology Physician [facility]  .*Total Encounter Time as defined by the Centers for Medicare and Medicaid Services includes, in addition to the face-to-face time of a patient visit (documented in the note above) non-face-to-face time: obtaining and reviewing outside history, ordering and reviewing medications, tests or procedures, care coordination (communications with other health care professionals or caregivers) and documentation in the medical record.   I, Brooke [name], acting as a Neurosurgeon for Brooke Lora, Brooke [name].,have documented all relevant documentation on the behalf of Brooke Lora, Brooke [name],as directed by  Brooke Lora, Brooke [name] while in the presence of Brooke Lora, Brooke [name].  .I have reviewed the above documentation for accuracy and completeness, and I agree with the above. Brooke [name]

## 2022-09-26 ENCOUNTER — Encounter: Payer: Self-pay | Admitting: Hematology

## 2022-10-13 ENCOUNTER — Ambulatory Visit: Payer: Medicare Other | Attending: Cardiology

## 2022-10-13 DIAGNOSIS — R0609 Other forms of dyspnea: Secondary | ICD-10-CM

## 2022-10-13 LAB — ECHOCARDIOGRAM COMPLETE: S' Lateral: 2.8 cm

## 2022-11-08 ENCOUNTER — Ambulatory Visit: Payer: Medicare Other | Admitting: Internal Medicine

## 2022-11-09 ENCOUNTER — Ambulatory Visit: Payer: Medicare Other | Admitting: Cardiology

## 2022-11-09 DIAGNOSIS — K080 Exfoliation of teeth due to systemic causes: Secondary | ICD-10-CM | POA: Diagnosis not present

## 2022-11-10 NOTE — Progress Notes (Signed)
 Cardiology Office Note:  .   Date:  11/11/2022  ID:  Brooke [name], DOB 09-01-1945, MRN [id number] PCP: Etta Grandchild, MD  [facility] Providers Cardiologist:  Gypsy Balsam, MD    History of Present Illness: .    Brooke [name] is a 77 y.o. female with a past medical history of coronary artery calcification, GERD, mantle cell lymphoma stage III/IV, dyslipidemia, elevated LPA.  10/13/2022 echo EF 60 to 65%, grade 1 DD 09/15/2022 Lexiscan normal, low risk study  She establish care with Dr. Bing Matter on 09/09/2022 after coronary artery calcifications were noted on CT imaging.  From a cardiac perspective she was doing stable, she did have some shortness of breath, making exercise difficult for her.  Stress test was arranged which was negative for ischemia.  LPA was elevated so she was referred to the lipid clinic for consideration with research.  She presents today for follow up of her coronary artery calcifications. She is doing well from a cardiac perspective, offers no formal complaints. We discussed her recent testing, and indications for further risk reduction. She previously was exercising daily however, her fatigue from her lymphoma was prohibitive to this. She is feeling better from that perspective. She denies chest pain, palpitations, dyspnea, pnd, orthopnea, n, v, dizziness, syncope, edema, weight gain, or early satiety.    ROS: Review of Systems  All other systems reviewed and are negative.    Studies Reviewed: .        Cardiac Studies & Procedures     STRESS TESTS  MYOCARDIAL PERFUSION IMAGING 09/15/2022  Narrative   The study is normal. The study is low risk.   No ST deviation was noted.   Left ventricular function is normal. Nuclear stress EF: 85%. The left ventricular ejection fraction is hyperdynamic (>65%). End diastolic cavity size is normal.   ECHOCARDIOGRAM  ECHOCARDIOGRAM COMPLETE 10/13/2022  Narrative ECHOCARDIOGRAM  REPORT    Patient Name:   Brooke [name] Date of Exam: 10/13/2022 Medical Rec #:  [id number]                   Height:       63.0 in Accession #:    [id number]                  Weight:       137.3 lb Date of Birth:  [birth year]                   BSA:          1.648 m Patient Age:    77 years                    BP:           110/68 mmHg Patient Gender: F                           HR:           71 bpm. Exam Location:  [facility]  Procedure: 2D Echo, Cardiac Doppler, Color Doppler and Strain Analysis  Indications:    Dyspnea on exertion [R06.09 (ICD-10-CM)]  History:        Patient has no prior history of Echocardiogram examinations. Atherosclerosis of aorta; Risk Factors:Dyslipidemia.  Sonographer:    Louie Boston RDCS Referring Phys: [phone] Marveen Reeks KRASOWSKI  IMPRESSIONS   1. Left ventricular ejection fraction, by estimation, is 60 to 65%. The left  ventricle has normal function. The left ventricle has no regional wall motion abnormalities. Left ventricular diastolic parameters are consistent with Grade I diastolic dysfunction (impaired relaxation). GLS-19.2% 2. Right ventricular systolic function is normal. The right ventricular size is normal. There is normal pulmonary artery systolic pressure. 3. The mitral valve is normal in structure. No evidence of mitral valve regurgitation. No evidence of mitral stenosis. 4. The aortic valve is normal in structure. Aortic valve regurgitation is not visualized. No aortic stenosis is present. 5. The inferior vena cava is normal in size with greater than 50% respiratory variability, suggesting right atrial pressure of 3 mmHg.  FINDINGS Left Ventricle: Left ventricular ejection fraction, by estimation, is 60 to 65%. The left ventricle has normal function. The left ventricle has no regional wall motion abnormalities. The left ventricular internal cavity size was normal in size. There is no left ventricular hypertrophy. Left ventricular  diastolic parameters are consistent with Grade I diastolic dysfunction (impaired relaxation).  Right Ventricle: The right ventricular size is normal. No increase in right ventricular wall thickness. Right ventricular systolic function is normal. There is normal pulmonary artery systolic pressure. The tricuspid regurgitant velocity is 2.57 m/s, and with an assumed right atrial pressure of 3 mmHg, the estimated right ventricular systolic pressure is 29.4 mmHg.  Left Atrium: Left atrial size was normal in size.  Right Atrium: Right atrial size was normal in size.  Pericardium: There is no evidence of pericardial effusion.  Mitral Valve: The mitral valve is normal in structure. No evidence of mitral valve regurgitation. No evidence of mitral valve stenosis.  Tricuspid Valve: The tricuspid valve is normal in structure. Tricuspid valve regurgitation is mild . No evidence of tricuspid stenosis.  Aortic Valve: The aortic valve is normal in structure. Aortic valve regurgitation is not visualized. No aortic stenosis is present.  Pulmonic Valve: The pulmonic valve was normal in structure. Pulmonic valve regurgitation is not visualized. No evidence of pulmonic stenosis.  Aorta: The aortic root is normal in size and structure.  Venous: The inferior vena cava is normal in size with greater than 50% respiratory variability, suggesting right atrial pressure of 3 mmHg.  IAS/Shunts: No atrial level shunt detected by color flow Doppler.   LEFT VENTRICLE PLAX 2D LVIDd:         3.70 cm   Diastology LVIDs:         2.80 cm   LV e' medial:    7.62 cm/s LV PW:         0.90 cm   LV E/e' medial:  13.3 LV IVS:        0.90 cm   LV e' lateral:   10.90 cm/s LVOT diam:     1.80 cm   LV E/e' lateral: 9.3 LV SV:         64 LV SV Index:   39 LVOT Area:     2.54 cm   RIGHT VENTRICLE             IVC RV Basal diam:  2.70 cm     IVC diam: 1.60 cm RV S prime:     14.30 cm/s TAPSE (M-mode): 1.5 cm  LEFT ATRIUM              Index        RIGHT ATRIUM           Index LA diam:        3.00 cm 1.82 cm/m   RA Area:  12.60 cm LA Vol (A2C):   29.8 ml 18.08 ml/m  RA Volume:   27.00 ml  16.38 ml/m LA Vol (A4C):   28.9 ml 17.54 ml/m LA Biplane Vol: 31.9 ml 19.36 ml/m AORTIC VALVE LVOT Vmax:   109.50 cm/s LVOT Vmean:  77.300 cm/s LVOT VTI:    0.252 m  AORTA Ao Root diam: 2.90 cm Ao Asc diam:  3.40 cm Ao Desc diam: 2.00 cm  MV E velocity: 101.50 cm/s  TRICUSPID VALVE MV A velocity: 108.00 cm/s  TR Peak grad:   26.4 mmHg MV E/A ratio:  0.94         TR Vmax:        257.00 cm/s  SHUNTS Systemic VTI:  0.25 m Systemic Diam: 1.80 cm  Belva Crome MD Electronically signed by Belva Crome MD Signature Date/Time: 10/13/2022/12:02:20 PM    Final             Risk Assessment/Calculations:     HYPERTENSION CONTROL Vitals:   11/11/22 1058 11/11/22 1249  BP: (!) 150/82 (!) 142/64    The patient's blood pressure is elevated above target today.  In order to address the patient's elevated BP: Blood pressure will be monitored at home to determine if medication changes need to be made.          Physical Exam:   VS:  BP (!) 142/64   Pulse 76   Ht 5\' 3"  (1.6 m)   Wt 134 lb 6.4 oz (61 kg)   SpO2 93%   BMI 23.81 kg/m    Wt Readings from Last 3 Encounters:  11/11/22 134 lb 6.4 oz (61 kg)  09/20/22 137 lb 4.8 oz (62.3 kg)  09/17/22 134 lb (60.8 kg)    GEN: Well nourished, well developed in no acute distress NECK: No JVD; No carotid bruits CARDIAC: RRR, no murmurs, rubs, gallops RESPIRATORY:  Clear to auscultation without rales, wheezing or rhonchi  ABDOMEN: Soft, non-tender, non-distended EXTREMITIES:  No edema; No deformity   ASSESSMENT AND PLAN: .   Coronary artery calcifications - noted on CT imaging, Stable with no anginal symptoms. Recent lexiscan was negative for ischemia. No indication for ischemic evaluation.  Continue aspirin 81 mg daily, continue Crestor 10 mg daily. Heart  healthy diet and regular cardiovascular exercise encouraged.   Dyslipidemia -most recent LDL was 73, triglycerides 161, LFTs were normal.  Will increase her Crestor to 10 mg daily.  Return in 8 weeks for FLP and LFTs. 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  Hypertension-she is not on any antihypertensive agents, blood pressure was elevated in the office at 150/82, recheck was 140/64.  This appears to be an isolated episode.  Will provide her with a blood pressure log, discussed measurement techniques.  She will turn blood pressure log back to our office after 2 weeks.       Dispo: Increase Crestor to 10 mg daily, repeat FLP and LFTs in 8 weeks, BP log x 2 weeks.  Signed, Flossie Dibble, NP

## 2022-11-11 ENCOUNTER — Encounter: Payer: Self-pay | Admitting: Cardiology

## 2022-11-11 ENCOUNTER — Ambulatory Visit: Payer: Medicare Other | Attending: Cardiology | Admitting: Cardiology

## 2022-11-11 VITALS — BP 142/64 | HR 76 | Ht 63.0 in | Wt 134.4 lb

## 2022-11-11 DIAGNOSIS — I2510 Atherosclerotic heart disease of native coronary artery without angina pectoris: Secondary | ICD-10-CM | POA: Diagnosis not present

## 2022-11-11 DIAGNOSIS — I700 Atherosclerosis of aorta: Secondary | ICD-10-CM | POA: Diagnosis not present

## 2022-11-11 DIAGNOSIS — E785 Hyperlipidemia, unspecified: Secondary | ICD-10-CM

## 2022-11-11 DIAGNOSIS — E7841 Elevated Lipoprotein(a): Secondary | ICD-10-CM

## 2022-11-11 MED ORDER — ROSUVASTATIN CALCIUM 10 MG PO TABS: 10.0000 mg | ORAL_TABLET | Freq: Every day | ORAL | 3 refills | Status: DC

## 2022-11-11 NOTE — Patient Instructions (Addendum)
Medication Instructions:  Your physician has recommended you make the following change in your medication:  Increase Crestor to 10 mg once daily   *If you need a refill on your cardiac medications before your next appointment, please call your pharmacy*   Lab Work: Your physician recommends that you return for lab work in: 8 weeks for Liver Profile and Fasting Lipid Panel Lab opens at 8am. You DO NOT NEED an appointment. Best time to come is between 8am and 12noon and between 1:30 and 4:30. If you have been asked to fast for your blood work please have nothing to eat or drink after midnight. You may have water.    If you have labs (blood work) drawn today and your tests are completely normal, you will receive your results only by: MyChart Message (if you have MyChart) OR A paper copy in the mail If you have any lab test that is abnormal or we need to change your treatment, we will call you to review the results.   Testing/Procedures: NONE   Follow-Up: At [facility], you and your health needs are our priority.  As part of our continuing mission to provide you with exceptional heart care, we have created designated Provider Care Teams.  These Care Teams include your primary Cardiologist (physician) and Advanced Practice Providers (APPs -  Physician Assistants and Nurse Practitioners) who all work together to provide you with the care you need, when you need it.  We recommend signing up for the patient portal called "MyChart".  Sign up information is provided on this After Visit Summary.  MyChart is used to connect with patients for Virtual Visits (Telemedicine).  Patients are able to view lab/test results, encounter notes, upcoming appointments, etc.  Non-urgent messages can be sent to your provider as well.   To learn more about what you can do with MyChart, go to [email or web address].    Your next appointment:   6 month(s)  Provider:   Gypsy Balsam, MD    Other  Instructions Check and record BP for 2 weeks and drop by office or send through MyChart We recommend an Omron arm cuff

## 2022-11-29 ENCOUNTER — Encounter: Payer: Self-pay | Admitting: Hematology

## 2022-12-07 DIAGNOSIS — Z961 Presence of intraocular lens: Secondary | ICD-10-CM | POA: Diagnosis not present

## 2022-12-07 DIAGNOSIS — H25812 Combined forms of age-related cataract, left eye: Secondary | ICD-10-CM | POA: Diagnosis not present

## 2022-12-13 ENCOUNTER — Encounter: Payer: Self-pay | Admitting: Hematology

## 2022-12-14 ENCOUNTER — Ambulatory Visit (AMBULATORY_SURGERY_CENTER): Payer: Medicare Other

## 2022-12-14 ENCOUNTER — Telehealth: Payer: Self-pay

## 2022-12-14 VITALS — Ht 63.0 in | Wt 133.0 lb

## 2022-12-14 DIAGNOSIS — Z860100 Personal history of colon polyps, unspecified: Secondary | ICD-10-CM

## 2022-12-14 DIAGNOSIS — Z800 Family history of malignant neoplasm of digestive organs: Secondary | ICD-10-CM

## 2022-12-14 MED ORDER — NA SULFATE-K SULFATE-MG SULF 17.5-3.13-1.6 GM/177ML PO SOLN
1.0000 | Freq: Once | ORAL | 0 refills | Status: AC
Start: 2022-12-14 — End: 2022-12-14

## 2022-12-14 NOTE — Telephone Encounter (Signed)
Dr. Rhea Belton,   Chart has not been review by Jonny Ruiz yet but I completed the patient PV. She has a colon here in 2019 but is noted to have a diff airway in 2017 for a surgery please advise if I need to cal th patient back to have an OV or if she is ok for direct to the hospital?  Thank you, PV

## 2022-12-14 NOTE — Progress Notes (Signed)
No egg or soy allergy known to patient  No issues known to pt with past sedation with any surgeries or procedures Patient denies ever being told they had issues or difficulty with intubation  No FH of Malignant Hyperthermia Pt is not on diet pills Pt is not on  home 02  Pt is not on blood thinners. Takes 81 mg ASA  Pt denies issues with constipation  No A fib or A flutter Have any cardiac testing pending-- no  LOA: independent  Prep: suprep    PV competed with patient. Prep instructions sent via mychart and home address. Goodrx coupon for provided [facility] to use for price reduction if needed.

## 2022-12-14 NOTE — Telephone Encounter (Signed)
Patient has had colonoscopy here since the surgery suggesting possible difficult airway Brooke [name] is this patient okay for [facility]? JMP

## 2022-12-17 ENCOUNTER — Other Ambulatory Visit: Payer: Self-pay | Admitting: Internal Medicine

## 2022-12-17 DIAGNOSIS — I700 Atherosclerosis of aorta: Secondary | ICD-10-CM

## 2022-12-20 ENCOUNTER — Other Ambulatory Visit: Payer: Self-pay

## 2022-12-20 DIAGNOSIS — C8318 Mantle cell lymphoma, lymph nodes of multiple sites: Secondary | ICD-10-CM

## 2022-12-21 ENCOUNTER — Inpatient Hospital Stay: Payer: Medicare Other | Admitting: Hematology

## 2022-12-21 ENCOUNTER — Inpatient Hospital Stay: Payer: Medicare Other

## 2022-12-21 ENCOUNTER — Inpatient Hospital Stay: Payer: Medicare Other | Attending: Hematology

## 2022-12-21 VITALS — BP 130/74 | HR 74 | Temp 98.5°F | Resp 18

## 2022-12-21 VITALS — BP 137/78 | HR 18 | Temp 97.9°F | Resp 20 | Wt 133.7 lb

## 2022-12-21 DIAGNOSIS — Z5112 Encounter for antineoplastic immunotherapy: Secondary | ICD-10-CM | POA: Insufficient documentation

## 2022-12-21 DIAGNOSIS — C8318 Mantle cell lymphoma, lymph nodes of multiple sites: Secondary | ICD-10-CM

## 2022-12-21 DIAGNOSIS — Z79899 Other long term (current) drug therapy: Secondary | ICD-10-CM | POA: Diagnosis not present

## 2022-12-21 DIAGNOSIS — D702 Other drug-induced agranulocytosis: Secondary | ICD-10-CM

## 2022-12-21 DIAGNOSIS — Z7189 Other specified counseling: Secondary | ICD-10-CM

## 2022-12-21 LAB — CMP (CANCER CENTER ONLY)
ALT: 10 U/L (ref 0–44)
AST: 17 U/L (ref 15–41)
Albumin: 4.2 g/dL (ref 3.5–5.0)
Alkaline Phosphatase: 62 U/L (ref 38–126)
Anion gap: 6 (ref 5–15)
BUN: 12 mg/dL (ref 8–23)
CO2: 29 mmol/L (ref 22–32)
Calcium: 9.4 mg/dL (ref 8.9–10.3)
Chloride: 106 mmol/L (ref 98–111)
Creatinine: 0.9 mg/dL (ref 0.44–1.00)
GFR, Estimated: >60 mL/min (ref >60)
Glucose, Bld: 91 mg/dL (ref 70–99)
Potassium: 4 mmol/L (ref 3.5–5.1)
Sodium: 141 mmol/L (ref 135–145)
Total Bilirubin: 0.9 mg/dL (ref <1.2)
Total Protein: 6.2 g/dL — ABNORMAL LOW (ref 6.5–8.1)

## 2022-12-21 LAB — CBC WITH DIFFERENTIAL (CANCER CENTER ONLY)
Abs Immature Granulocytes: 0.05 10*3/uL (ref 0.00–0.07)
Basophils Absolute: 0 10*3/uL (ref 0.0–0.1)
Basophils Relative: 1 %
Eosinophils Absolute: 0.1 10*3/uL (ref 0.0–0.5)
Eosinophils Relative: 1 %
HCT: 32.4 % — ABNORMAL LOW (ref 36.0–46.0)
Hemoglobin: 11.1 g/dL — ABNORMAL LOW (ref 12.0–15.0)
Immature Granulocytes: 1 %
Lymphocytes Relative: 17 %
Lymphs Abs: 1 10*3/uL (ref 0.7–4.0)
MCH: 32.8 pg (ref 26.0–34.0)
MCHC: 34.3 g/dL (ref 30.0–36.0)
MCV: 95.9 fL (ref 80.0–100.0)
Monocytes Absolute: 0.8 10*3/uL (ref 0.1–1.0)
Monocytes Relative: 14 %
Neutro Abs: 4.1 10*3/uL (ref 1.7–7.7)
Neutrophils Relative %: 66 %
Platelet Count: 208 10*3/uL (ref 150–400)
RBC: 3.38 MIL/uL — ABNORMAL LOW (ref 3.87–5.11)
RDW: 13.2 % (ref 11.5–15.5)
WBC Count: 6.1 10*3/uL (ref 4.0–10.5)
nRBC: 0 % (ref 0.0–0.2)

## 2022-12-21 LAB — LACTATE DEHYDROGENASE: LDH: 173 U/L (ref 98–192)

## 2022-12-21 MED ORDER — MONTELUKAST SODIUM 10 MG PO TABS
10.0000 mg | ORAL_TABLET | Freq: Once | ORAL | Status: AC
  Administered 2022-12-21: 10 mg via ORAL
  Filled 2022-12-21: qty 1

## 2022-12-21 MED ORDER — FAMOTIDINE IN NACL 20-0.9 MG/50ML-% IV SOLN
20.0000 mg | Freq: Once | INTRAVENOUS | Status: AC
  Administered 2022-12-21: 20 mg via INTRAVENOUS
  Filled 2022-12-21: qty 50

## 2022-12-21 MED ORDER — DIPHENHYDRAMINE HCL 25 MG PO CAPS
50.0000 mg | ORAL_CAPSULE | Freq: Once | ORAL | Status: AC
  Administered 2022-12-21: 50 mg via ORAL
  Filled 2022-12-21: qty 2

## 2022-12-21 MED ORDER — SODIUM CHLORIDE 0.9 % IV SOLN
12.0000 mg | Freq: Once | INTRAVENOUS | Status: AC
  Administered 2022-12-21: 12 mg via INTRAVENOUS
  Filled 2022-12-21: qty 1.2

## 2022-12-21 MED ORDER — ACETAMINOPHEN 325 MG PO TABS
650.0000 mg | ORAL_TABLET | Freq: Once | ORAL | Status: AC
  Administered 2022-12-21: 650 mg via ORAL
  Filled 2022-12-21: qty 2

## 2022-12-21 MED ORDER — SODIUM CHLORIDE 0.9 % IV SOLN
Freq: Once | INTRAVENOUS | Status: AC
Start: 2022-12-21 — End: 2022-12-21

## 2022-12-21 MED ORDER — SODIUM CHLORIDE 0.9 % IV SOLN
375.0000 mg/m2 | Freq: Once | INTRAVENOUS | Status: AC
  Administered 2022-12-21: 600 mg via INTRAVENOUS
  Filled 2022-12-21: qty 50

## 2022-12-21 NOTE — Progress Notes (Signed)
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 She is here for her maintenance Rituxan.  Patient was last sen by me on 09/20/2022 and she complained of mild skin rash/color change near her face after her previous cycle of her treatment, which is controlled with antihistamine and prednisone.   Patient notes she has been doing well overall without any new or severe medical concerns since our last visit. She denies any new infection issues, fever, chills, night sweats, unexpected weight loss, bone pain, chest pain, new lumps/bumps, bone pain, or leg swelling.   She notes she has an upcoming colonoscopy, scheduled on Dec. 2.   She tolerated her previous cycle of maintenance Rituxan treatment well without any new or severe toxicities.    She is UTD with all age related vaccines.   REVIEW OF SYSTEMS:   10 Point review of Systems was done is negative except as noted above.  Past Medical History:  Diagnosis Date   Allergy    Arthritis    Back pain    Complication of anesthesia    bp dropped yrs ago, recent surgeries went ok   Dysrhythmia    occ, no meds for   Gastritis    GERD (gastroesophageal reflux disease)    H. pylori infection    Headache    migraines occ   Hiatal hernia    Hx of adenomatous colonic polyps 11/30/2017   Hyperlipidemia    Hypothyroidism 1970's   hx of years ago, no meds for    mantle cell lymphoma    Pulmonary nodule    Shortness of breath dyspnea    with exertion, pt told comes from acid reflux    . Past Surgical History:  Procedure Laterality Date   APPENDECTOMY     COLONOSCOPY  12/07/2013   DILATION AND CURETTAGE OF UTERUS     EYE SURGERY Bilateral 07/2022   Cataract surgery   LAPAROSCOPIC CHOLECYSTECTOMY SINGLE SITE WITH INTRAOPERATIVE CHOLANGIOGRAM N/A 10/28/2015   Procedure: LAPAROSCOPIC CHOLECYSTECTOMY WITH INTRAOPERATIVE CHOLANGIOGRAM;  Surgeon: Darnell Level, MD;  Location: [facility];  Service: General;  Laterality: N/A;   LAPAROSCOPY  yrs ago   x 2   TMJ ARTHROSCOPY     TOTAL ABDOMINAL HYSTERECTOMY     complete    . Social History   Tobacco Use   Smoking status: Never   Smokeless tobacco: Never  Vaping Use  Vaping status: Never Used  Substance Use Topics   Alcohol use: Yes    Alcohol/week: 4.0 - 5.0 standard drinks of alcohol    Types: 4 - 5 Cans of beer per week    Comment: 4-5 beers weekly-occ per pt   Drug use: No    ALLERGIES:  has No Known Allergies.  MEDICATIONS:  Current Outpatient Medications  Medication Sig Dispense Refill   ALPRAZolam (XANAX) 0.5 MG tablet Take 0.5 mg by mouth 3 (three) times daily.     ASPIRIN LOW DOSE 81 MG tablet TAKE 1 TABLET BY MOUTH EVERY DAY 90 tablet 1   Cholecalciferol (VITAMIN D3) 5000 units CAPS Take 1 capsule by  mouth every other day.     fluticasone (FLONASE) 50 MCG/ACT nasal spray Place 1 spray into both nostrils daily as needed for allergies. (Patient not taking: Reported on 12/14/2022)     HYDROcodone-acetaminophen (NORCO) 10-325 MG tablet Take 1 tablet by mouth every 8 (eight) hours as needed. (Patient taking differently: Take 1 tablet by mouth every 8 (eight) hours as needed for moderate pain (pain score 4-6) or severe pain (pain score 7-10).) 30 tablet 0   omeprazole (PRILOSEC) 20 MG capsule TAKE 1 CAPSULE BY MOUTH DAILY 30 MINUTES BEFORE MORNING MEAL (Patient taking differently: Take 20 mg by mouth daily.) 90 capsule 1   predniSONE (DELTASONE) 20 MG tablet Take 1 tablet (20 mg total) by mouth daily with breakfast. For 5 days after each Rituxan infusion (Patient not taking: Reported on 12/14/2022) 5 tablet 5   rosuvastatin (CRESTOR) 10 MG tablet Take 1 tablet (10 mg total) by mouth daily. 90 tablet 3   vitamin B-12 (CYANOCOBALAMIN) 500 MCG tablet Take 500 mcg by mouth every other day.     No current facility-administered medications for this visit.    PHYSICAL EXAMINATION: .BP 137/78   Pulse (!) 18   Temp 97.9 F (36.6 C)   Resp 20   Wt 133 lb 11.2 oz (60.6 kg)   SpO2 100%   BMI 23.68 kg/m  NAD GENERAL:alert, in no acute distress and comfortable SKIN: no acute rashes, no significant lesions EYES: conjunctiva are pink and non-injected, sclera anicteric NECK: supple, no JVD LYMPH:  no palpable lymphadenopathy in the cervical, axillary or inguinal regions LUNGS: clear to auscultation b/l with normal respiratory effort HEART: regular rate & rhythm ABDOMEN:  normoactive bowel sounds , non tender, not distended. Extremity: no pedal edema PSYCH: alert & oriented x 3 with fluent speech NEURO: no focal motor/sensory deficits  LABORATORY DATA:   I have reviewed the data as listed      Latest Ref Rng & Units 12/21/2022   10:24 AM 09/20/2022   11:26 AM 06/21/2022   11:12 AM  CBC  WBC 4.0  - 10.5 K/uL 6.1  7.4  5.0   Hemoglobin 12.0 - 15.0 g/dL 16.1  09.6  04.5   Hematocrit 36.0 - 46.0 % 32.4  32.3  35.4   Platelets 150 - 400 K/uL 208  212  193     .    Latest Ref Rng & Units 12/21/2022   10:24 AM 09/20/2022   11:26 AM 06/21/2022   11:12 AM  CMP  Glucose 70 - 99 mg/dL 91  95  84   BUN 8 - 23 mg/dL 12  13  12    Creatinine 0.44 - 1.00 mg/dL 4.09  8.11  9.14   Sodium 135 - 145 mmol/L 141  143  144   Potassium 3.5 -  5.1 mmol/L 4.0  3.7  3.7   Chloride 98 - 111 mmol/L 106  110  108   CO2 22 - 32 mmol/L 29  26  27    Calcium 8.9 - 10.3 mg/dL 9.4  9.3  9.6   Total Protein 6.5 - 8.1 g/dL 6.2  6.4  6.7   Total Bilirubin <1.2 mg/dL 0.9  0.7  0.6   Alkaline Phos 38 - 126 U/L 62  60  68   AST 15 - 41 U/L 17  19  19    ALT 0 - 44 U/L 10  11  9     . Lab Results  Component Value Date   LDH 173 12/21/2022   RADIOGRAPHIC STUDIES: I have personally reviewed the radiological images as listed and agreed with the findings in the report. No results found.   ASSESSMENT & PLAN:   77 yo with   1) Stage III/IV Mantle cell lymphoma 01/11/2020 PET/CT [id number]) revealed no visible disease, continued resolution. 2) s/p hypersensitivity reaction to Rapid Rituxan. - rash and low grade fever -  resolved with solumedrol/tylenol, benadryl and famotidine   PLAN: -Discussed lab results from today, 12/21/2022, in detail with the patient. CBC is stable. CMP is stable.  -Patient has no clinical symptoms suggestive of mantle cell lymphoma at this time. -Patient tolerated her previous cycle of Rituxan well without any new or severe toxicities.  -Patient can continue Rituxan treatment today without any dose modification.  -continue same supportive medications. -Rituxan orders reviewed and signed. -Continue her B12 and vitamin D supplements.   FOLLOW-UP: Per integrated scheduling - plz schedule next 3 maintenance Rituxan treatment with labs and MD visits  The total time spent in the  appointment was 30 minutes* .  All of the patient's questions were answered with apparent satisfaction. The patient knows to call the clinic with any problems, questions or concerns.   Wyvonnia Lora MD MS AAHIVMS [facility] Hematology/Oncology Physician [facility]  .*Total Encounter Time as defined by the Centers for Medicare and Medicaid Services includes, in addition to the face-to-face time of a patient visit (documented in the note above) non-face-to-face time: obtaining and reviewing outside history, ordering and reviewing medications, tests or procedures, care coordination (communications with other health care professionals or caregivers) and documentation in the medical record.   I,Brooke [name],acting as a Neurosurgeon for Wyvonnia Lora, MD.,have documented all relevant documentation on the behalf of Wyvonnia Lora, MD,as directed by  Wyvonnia Lora, MD while in the presence of Wyvonnia Lora, MD.

## 2022-12-21 NOTE — Patient Instructions (Signed)
[facility] - A DEPT OF [facility]  Discharge Instructions: Thank you for choosing [facility] to provide your oncology and hematology care.   If you have a lab appointment with the Cancer Center, please go directly to the Cancer Center and check in at the registration area.   Wear comfortable clothing and clothing appropriate for easy access to any Portacath or PICC line.   We strive to give you quality time with your provider. You may need to reschedule your appointment if you arrive late (15 or more minutes).  Arriving late affects you and other patients whose appointments are after yours.  Also, if you miss three or more appointments without notifying the office, you may be dismissed from the clinic at the provider's discretion.      For prescription refill requests, have your pharmacy contact our office and allow 72 hours for refills to be completed.    Today you received the following chemotherapy and/or immunotherapy agents: Rituximab (Ruxience)   To help prevent nausea and vomiting after your treatment, we encourage you to take your nausea medication as directed.  BELOW ARE SYMPTOMS THAT SHOULD BE REPORTED IMMEDIATELY: *FEVER GREATER THAN 100.4 F (38 C) OR HIGHER *CHILLS OR SWEATING *NAUSEA AND VOMITING THAT IS NOT CONTROLLED WITH YOUR NAUSEA MEDICATION *UNUSUAL SHORTNESS OF BREATH *UNUSUAL BRUISING OR BLEEDING *URINARY PROBLEMS (pain or burning when urinating, or frequent urination) *BOWEL PROBLEMS (unusual diarrhea, constipation, pain near the anus) TENDERNESS IN MOUTH AND THROAT WITH OR WITHOUT PRESENCE OF ULCERS (sore throat, sores in mouth, or a toothache) UNUSUAL RASH, SWELLING OR PAIN  UNUSUAL VAGINAL DISCHARGE OR ITCHING   Items with * indicate a potential emergency and should be followed up as soon as possible or go to the Emergency Department if any problems should occur.  Please show the CHEMOTHERAPY ALERT CARD or  IMMUNOTHERAPY ALERT CARD at check-in to the Emergency Department and triage nurse.  Should you have questions after your visit or need to cancel or reschedule your appointment, please contact [facility] - A DEPT OF [facility]  Dept: [phone]  and follow the prompts.  Office hours are 8:00 a.m. to 4:30 p.m. Monday - Friday. Please note that voicemails left after 4:00 p.m. may not be returned until the following business day.  We are closed weekends and major holidays. You have access to a nurse at all times for urgent questions. Please call the main number to the clinic Dept: [phone] and follow the prompts.   For any non-urgent questions, you may also contact your provider using MyChart. We now offer e-Visits for anyone 35 and older to request care online for non-urgent symptoms. For details visit [email or web address].   Also download the MyChart app! Go to the app store, search "MyChart", open the app, select [facility], and log in with your MyChart username and password.

## 2022-12-23 ENCOUNTER — Encounter: Payer: Self-pay | Admitting: Internal Medicine

## 2022-12-27 ENCOUNTER — Encounter: Payer: Self-pay | Admitting: Hematology

## 2022-12-30 ENCOUNTER — Encounter: Payer: Self-pay | Admitting: Hematology

## 2022-12-30 NOTE — Telephone Encounter (Signed)
Telephone call  

## 2023-01-10 ENCOUNTER — Telehealth: Payer: Self-pay

## 2023-01-10 ENCOUNTER — Encounter: Payer: Self-pay | Admitting: Internal Medicine

## 2023-01-10 ENCOUNTER — Ambulatory Visit: Payer: Medicare Other | Admitting: Internal Medicine

## 2023-01-10 VITALS — BP 144/80 | HR 92 | Temp 97.8°F | Resp 17 | Ht 63.0 in | Wt 133.0 lb

## 2023-01-10 DIAGNOSIS — D124 Benign neoplasm of descending colon: Secondary | ICD-10-CM

## 2023-01-10 DIAGNOSIS — Z800 Family history of malignant neoplasm of digestive organs: Secondary | ICD-10-CM | POA: Diagnosis not present

## 2023-01-10 DIAGNOSIS — K648 Other hemorrhoids: Secondary | ICD-10-CM | POA: Diagnosis not present

## 2023-01-10 DIAGNOSIS — Z860101 Personal history of adenomatous and serrated colon polyps: Secondary | ICD-10-CM

## 2023-01-10 DIAGNOSIS — Z860100 Personal history of colon polyps, unspecified: Secondary | ICD-10-CM

## 2023-01-10 DIAGNOSIS — Z1211 Encounter for screening for malignant neoplasm of colon: Secondary | ICD-10-CM

## 2023-01-10 MED ORDER — SODIUM CHLORIDE 0.9 % IV SOLN: 500.0000 mL | Freq: Once | INTRAVENOUS | Status: DC

## 2023-01-10 NOTE — Progress Notes (Signed)
Called to room to assist during endoscopic procedure.  Patient ID and intended procedure confirmed with present staff. Received instructions for my participation in the procedure from the performing physician.  

## 2023-01-10 NOTE — Op Note (Signed)
[facility] Patient Name: Brooke [name] Procedure Date: 01/10/2023 3:52 PM MRN: [id number] Endoscopist: Beverley Fiedler , MD, [id number] Age: 77 Referring MD:  Date of Birth: 1945-08-04 Gender: Female Account #: [id number] Procedure:                Colonoscopy Indications:              High risk colon cancer surveillance: Personal                            history of adenomatous colon polyps, Last                            colonoscopy: October 2019 (TA x 2); family hx of                            colon cancer in father (after age 85) Medicines:                Monitored Anesthesia Care Procedure:                Pre-Anesthesia Assessment:                           - Prior to the procedure, a History and Physical                            was performed, and patient medications and                            allergies were reviewed. The patient's tolerance of                            previous anesthesia was also reviewed. The risks                            and benefits of the procedure and the sedation                            options and risks were discussed with the patient.                            All questions were answered, and informed consent                            was obtained. Prior Anticoagulants: The patient has                            taken no anticoagulant or antiplatelet agents. ASA                            Grade Assessment: II - A patient with mild systemic                            disease. After reviewing the risks and benefits,  the patient was deemed in satisfactory condition to                            undergo the procedure.                           After obtaining informed consent, the colonoscope                            was passed under direct vision. Throughout the                            procedure, the patient's blood pressure, pulse, and                            oxygen saturations were  monitored continuously. The                            Olympus Scope SN: [id number] was introduced through                            the anus and advanced to the terminal ileum. The                            colonoscopy was performed without difficulty. The                            patient tolerated the procedure well. The quality                            of the bowel preparation was good. The terminal                            ileum, ileocecal valve, appendiceal orifice, and                            rectum were photographed. Scope In: 4:00:27 PM Scope Out: 4:12:11 PM Scope Withdrawal Time: 0 hours 8 minutes 20 seconds  Total Procedure Duration: 0 hours 11 minutes 44 seconds  Findings:                 The digital rectal exam was normal.                           The terminal ileum appeared normal.                           A 4 mm polyp was found in the descending colon. The                            polyp was sessile. The polyp was removed with a                            cold snare. Resection and retrieval were complete.  Internal hemorrhoids were found during                            retroflexion. The hemorrhoids were small.                           The exam was otherwise without abnormality. Complications:            No immediate complications. Estimated Blood Loss:     Estimated blood loss: none. Impression:               - The examined portion of the ileum was normal.                           - One 4 mm polyp in the descending colon, removed                            with a cold snare. Resected and retrieved.                           - Small internal hemorrhoids.                           - The examination was otherwise normal. Recommendation:           - Patient has a contact number available for                            emergencies. The signs and symptoms of potential                            delayed complications were discussed with the                             patient. Return to normal activities tomorrow.                            Written discharge instructions were provided to the                            patient.                           - Resume previous diet.                           - Continue present medications.                           - Await pathology results.                           - No recommendation at this time regarding repeat                            colonoscopy due to age at next surveillance  interval (5 years). Beverley Fiedler, MD 01/10/2023 4:15:36 PM This report has been signed electronically.

## 2023-01-10 NOTE — Patient Instructions (Signed)
Thank you for letting us care for your healthcare needs today! Please see handouts regarding Polyps and Hemorrhoids.  YOU HAD AN ENDOSCOPIC PROCEDURE TODAY AT THE [facility]:   Refer to the procedure report that was given to you for any specific questions about what was found during the examination.  If the procedure report does not answer your questions, please call your gastroenterologist to clarify.  If you requested that your care partner not be given the details of your procedure findings, then the procedure report has been included in a sealed envelope for you to review at your convenience later.  YOU SHOULD EXPECT: Some feelings of bloating in the abdomen. Passage of more gas than usual.  Walking can help get rid of the air that was put into your GI tract during the procedure and reduce the bloating. If you had a lower endoscopy (such as a colonoscopy or flexible sigmoidoscopy) you may notice spotting of blood in your stool or on the toilet paper. If you underwent a bowel prep for your procedure, you may not have a normal bowel movement for a few days.  Please Note:  You might notice some irritation and congestion in your nose or some drainage.  This is from the oxygen used during your procedure.  There is no need for concern and it should clear up in a day or so.  SYMPTOMS TO REPORT IMMEDIATELY:  Following lower endoscopy (colonoscopy or flexible sigmoidoscopy):  Excessive amounts of blood in the stool  Significant tenderness or worsening of abdominal pains  Swelling of the abdomen that is new, acute  Fever of 100F or higher  For urgent or emergent issues, a gastroenterologist can be reached at any hour by calling [phone]. Do not use MyChart messaging for urgent concerns.    DIET:  We do recommend a small meal at first, but then you may proceed to your regular diet.  Drink plenty of fluids but you should avoid alcoholic beverages for 24 hours.  ACTIVITY:  You  should plan to take it easy for the rest of today and you should NOT DRIVE or use heavy machinery until tomorrow (because of the sedation medicines used during the test).    FOLLOW UP: Our staff will call the number listed on your records the next business day following your procedure.  We will call around 7:15- 8:00 am to check on you and address any questions or concerns that you may have regarding the information given to you following your procedure. If we do not reach you, we will leave a message.     If any biopsies were taken you will be contacted by phone or by letter within the next 1-3 weeks.  Please call us at [phone] if you have not heard about the biopsies in 3 weeks.    SIGNATURES/CONFIDENTIALITY: You and/or your care partner have signed paperwork which will be entered into your electronic medical record.  These signatures attest to the fact that that the information above on your After Visit Summary has been reviewed and is understood.  Full responsibility of the confidentiality of this discharge information lies with you and/or your care-partner.

## 2023-01-10 NOTE — Progress Notes (Unsigned)
GASTROENTEROLOGY PROCEDURE H&P NOTE   Primary Care Physician: Etta Grandchild, MD    Reason for Procedure:  History of adenomatous colon polyps, family history of colon cancer  Plan:    Colonoscopy  Patient is appropriate for endoscopic procedure(s) in the ambulatory ([facility]) setting.  The nature of the procedure, as well as the risks, benefits, and alternatives were carefully and thoroughly reviewed with the patient. Ample time for discussion and questions allowed. The patient understood, was satisfied, and agreed to proceed.     HPI: Brooke [name] is a 77 y.o. female who presents for surveillance colonoscopy.  Medical history as below.  Tolerated the prep.  No recent chest pain or shortness of breath.  No abdominal pain today.  Past Medical History:  Diagnosis Date   Allergy    Arthritis    Back pain    Complication of anesthesia    bp dropped yrs ago, recent surgeries went ok   Dysrhythmia    occ, no meds for   Gastritis    GERD (gastroesophageal reflux disease)    H. pylori infection    Headache    migraines occ   Hiatal hernia    Hx of adenomatous colonic polyps 11/30/2017   Hyperlipidemia    Hypothyroidism 1970's   hx of years ago, no meds for    mantle cell lymphoma    Pulmonary nodule    Shortness of breath dyspnea    with exertion, pt told comes from acid reflux    Past Surgical History:  Procedure Laterality Date   APPENDECTOMY     COLONOSCOPY  12/07/2013   COLONOSCOPY W/ POLYPECTOMY  11/14/2017   TA   DILATION AND CURETTAGE OF UTERUS     EYE SURGERY Bilateral 07/2022   Cataract surgery   LAPAROSCOPIC CHOLECYSTECTOMY SINGLE SITE WITH INTRAOPERATIVE CHOLANGIOGRAM N/A 10/28/2015   Procedure: LAPAROSCOPIC CHOLECYSTECTOMY WITH INTRAOPERATIVE CHOLANGIOGRAM;  Surgeon: Darnell Level, MD;  Location: [facility];  Service: General;  Laterality: N/A;   LAPAROSCOPY  yrs ago   x 2   TMJ ARTHROSCOPY     TOTAL ABDOMINAL HYSTERECTOMY     complete     Prior to Admission medications   Medication Sig Start Date End Date Taking? Authorizing Provider  acetaminophen (TYLENOL) 500 MG tablet Take 500 mg by mouth every 6 (six) hours as needed.   Yes [provider]  ASPIRIN LOW DOSE 81 MG tablet TAKE 1 TABLET BY MOUTH EVERY DAY 12/17/22  Yes Etta Grandchild, MD  Cholecalciferol (VITAMIN D3) 5000 units CAPS Take 1 capsule by mouth every other day.   Yes [provider]  cyclobenzaprine (FLEXERIL) 5 MG tablet Take 5 mg by mouth 3 (three) times daily as needed for muscle spasms.   Yes [provider]  HYDROcodone-acetaminophen (NORCO) 10-325 MG tablet Take 1 tablet by mouth every 8 (eight) hours as needed. Patient taking differently: Take 1 tablet by mouth every 8 (eight) hours as needed for moderate pain (pain score 4-6) or severe pain (pain score 7-10). 08/04/22  Yes Etta Grandchild, MD  omeprazole (PRILOSEC) 20 MG capsule TAKE 1 CAPSULE BY MOUTH DAILY 30 MINUTES BEFORE MORNING MEAL Patient taking differently: Take 20 mg by mouth daily. 09/17/22  Yes Etta Grandchild, MD  rosuvastatin (CRESTOR) 10 MG tablet Take 1 tablet (10 mg total) by mouth daily. 11/11/22 02/09/23 Yes Flossie Dibble, NP  vitamin B-12 (CYANOCOBALAMIN) 500 MCG tablet Take 500 mcg by mouth every other day.   Yes  [provider]  ALPRAZolam Prudy Feeler) 0.5 MG tablet Take 0.5 mg by mouth 3 (three) times daily. 05/03/21   [provider]  predniSONE (DELTASONE) 20 MG tablet Take 1 tablet (20 mg total) by mouth daily with breakfast. For 5 days after each Rituxan infusion Patient not taking: Reported on 12/14/2022 03/22/22   Johney Maine, MD    Current Outpatient Medications  Medication Sig Dispense Refill   acetaminophen (TYLENOL) 500 MG tablet Take 500 mg by mouth every 6 (six) hours as needed.     ASPIRIN LOW DOSE 81 MG tablet TAKE 1 TABLET BY MOUTH EVERY DAY 90 tablet 1   Cholecalciferol (VITAMIN D3) 5000 units CAPS Take 1 capsule by  mouth every other day.     cyclobenzaprine (FLEXERIL) 5 MG tablet Take 5 mg by mouth 3 (three) times daily as needed for muscle spasms.     HYDROcodone-acetaminophen (NORCO) 10-325 MG tablet Take 1 tablet by mouth every 8 (eight) hours as needed. (Patient taking differently: Take 1 tablet by mouth every 8 (eight) hours as needed for moderate pain (pain score 4-6) or severe pain (pain score 7-10).) 30 tablet 0   omeprazole (PRILOSEC) 20 MG capsule TAKE 1 CAPSULE BY MOUTH DAILY 30 MINUTES BEFORE MORNING MEAL (Patient taking differently: Take 20 mg by mouth daily.) 90 capsule 1   rosuvastatin (CRESTOR) 10 MG tablet Take 1 tablet (10 mg total) by mouth daily. 90 tablet 3   vitamin B-12 (CYANOCOBALAMIN) 500 MCG tablet Take 500 mcg by mouth every other day.     ALPRAZolam (XANAX) 0.5 MG tablet Take 0.5 mg by mouth 3 (three) times daily.     predniSONE (DELTASONE) 20 MG tablet Take 1 tablet (20 mg total) by mouth daily with breakfast. For 5 days after each Rituxan infusion (Patient not taking: Reported on 12/14/2022) 5 tablet 5   Current Facility-Administered Medications  Medication Dose Route Frequency Provider Last Rate Last Admin   0.9 %  sodium chloride infusion  500 mL Intravenous Once Durant Scibilia, Carie Caddy, MD        Allergies as of 01/10/2023   (No Known Allergies)    Family History  Problem Relation Age of Onset   Heart failure Mother    Colon cancer Father        early 68's   Ovarian cancer Sister    Colon polyps Sister    Kidney cancer Brother    Colon polyps Brother    Lung cancer Brother    Colon cancer Paternal Grandfather        unknown age of onset   Breast cancer Maternal Aunt    Breast cancer Cousin    Colon cancer Cousin    Rectal cancer Neg Hx    Stomach cancer Neg Hx    Esophageal cancer Neg Hx     Social History   Socioeconomic History   Marital status: Widowed    Spouse name: Not on file   Number of children: 2   Years of education: Not on file   Highest education  level: Not on file  Occupational History   Occupation: Retired  Tobacco Use   Smoking status: Never   Smokeless tobacco: Never  Vaping Use   Vaping status: Never Used  Substance and Sexual Activity   Alcohol use: Yes    Alcohol/week: 4.0 - 5.0 standard drinks of alcohol    Types: 4 - 5 Cans of beer per week    Comment: 4-5 beers weekly-occ per pt  Drug use: No   Sexual activity: Not Currently    Birth control/protection: Post-menopausal, Surgical  Other Topics Concern   Not on file  Social History Narrative   1 child deceased   1 adopted daughter   Mostly lives alone, daughter will being staying soon.  She will have her first grand baby.   Social Determinants of Health   Financial Resource Strain: Low Risk  (09/17/2022)   Overall Financial Resource Strain (CARDIA)    Difficulty of Paying Living Expenses: Not very hard  Food Insecurity: No Food Insecurity (09/17/2022)   Hunger Vital Sign    Worried About Running Out of Food in the Last Year: Never true    Ran Out of Food in the Last Year: Never true  Transportation Needs: No Transportation Needs (09/17/2022)   PRAPARE - Administrator, Civil Service (Medical): No    Lack of Transportation (Non-Medical): No  Physical Activity: Insufficiently Active (09/17/2022)   Exercise Vital Sign    Days of Exercise per Week: 3 days    Minutes of Exercise per Session: 30 min  Stress: No Stress Concern Present (09/17/2022)   [employer] of Occupational Health - Occupational Stress Questionnaire    Feeling of Stress : Only a little  Social Connections: Unknown (09/17/2022)   Social Connection and Isolation Panel [NHANES]    Frequency of Communication with Friends and Family: More than three times a week    Frequency of Social Gatherings with Friends and Family: Twice a week    Attends Religious Services: More than 4 times per year    Active Member of [employer] or Organizations: No    Attends Banker Meetings: Never     Marital Status: Not on file  Intimate Partner Violence: Not At Risk (09/17/2022)   Humiliation, Afraid, Rape, and Kick questionnaire    Fear of Current or Ex-Partner: No    Emotionally Abused: No    Physically Abused: No    Sexually Abused: No    Physical Exam: Vital signs in last 24 hours: @BP  126/78   Pulse (!) 108   Temp 97.8 F (36.6 C) (Temporal)   Ht 5\' 3"  (1.6 m)   Wt 133 lb (60.3 kg)   SpO2 97%   BMI 23.56 kg/m  GEN: NAD EYE: Sclerae anicteric ENT: MMM CV: Non-tachycardic Pulm: CTA b/l GI: Soft, NT/ND NEURO:  Alert & Oriented x 3   Erick Blinks, MD [facility]  01/10/2023 3:48 PM

## 2023-01-10 NOTE — Progress Notes (Unsigned)
Vss nad trans to pacu 

## 2023-01-10 NOTE — Progress Notes (Unsigned)
Pt's states no medical or surgical changes since previsit or office visit. 

## 2023-01-10 NOTE — Telephone Encounter (Signed)
Patient called and stated that she vomited after finishing her prep this morning. She said its a different prep than what she used last time and she didn't think she went to the bathroom as much as she has in the past on a prep. I asked her how her last BM looked and she said liquid and yellow. I explained that that is what we are looking for and that it sounded like she was cleaned out. She agreed and had no further questions.

## 2023-01-11 ENCOUNTER — Telehealth: Payer: Self-pay

## 2023-01-11 NOTE — Telephone Encounter (Signed)
  Follow up Call-     01/10/2023    3:15 PM  Call back number  Post procedure Call Back phone  # [phone]  Permission to leave phone message Yes     Patient questions:  Do you have a fever, pain , or abdominal swelling? No. Pain Score  0 *  Have you tolerated food without any problems? Yes.    Have you been able to return to your normal activities? Yes.    Do you have any questions about your discharge instructions: Diet   No. Medications  No. Follow up visit  No  Do you have questions or concerns about your Care? No.  Actions: * If pain score is 4 or above: No action needed, pain <4.

## 2023-01-13 LAB — SURGICAL PATHOLOGY

## 2023-01-14 ENCOUNTER — Encounter: Payer: Self-pay | Admitting: Internal Medicine

## 2023-02-14 ENCOUNTER — Ambulatory Visit: Payer: Medicare Other | Admitting: Internal Medicine

## 2023-03-17 ENCOUNTER — Encounter: Payer: Self-pay | Admitting: Internal Medicine

## 2023-03-17 ENCOUNTER — Ambulatory Visit: Payer: Medicare Other | Admitting: Internal Medicine

## 2023-03-17 ENCOUNTER — Ambulatory Visit: Payer: Medicare Other

## 2023-03-17 VITALS — BP 136/84 | HR 80 | Temp 97.9°F | Ht 63.0 in | Wt 126.2 lb

## 2023-03-17 DIAGNOSIS — J302 Other seasonal allergic rhinitis: Secondary | ICD-10-CM | POA: Insufficient documentation

## 2023-03-17 DIAGNOSIS — C8318 Mantle cell lymphoma, lymph nodes of multiple sites: Secondary | ICD-10-CM

## 2023-03-17 DIAGNOSIS — R059 Cough, unspecified: Secondary | ICD-10-CM | POA: Diagnosis not present

## 2023-03-17 DIAGNOSIS — D539 Nutritional anemia, unspecified: Secondary | ICD-10-CM | POA: Insufficient documentation

## 2023-03-17 DIAGNOSIS — E785 Hyperlipidemia, unspecified: Secondary | ICD-10-CM | POA: Insufficient documentation

## 2023-03-17 DIAGNOSIS — R052 Subacute cough: Secondary | ICD-10-CM | POA: Insufficient documentation

## 2023-03-17 DIAGNOSIS — M51369 Other intervertebral disc degeneration, lumbar region without mention of lumbar back pain or lower extremity pain: Secondary | ICD-10-CM | POA: Diagnosis not present

## 2023-03-17 DIAGNOSIS — I700 Atherosclerosis of aorta: Secondary | ICD-10-CM | POA: Diagnosis not present

## 2023-03-17 HISTORY — DX: Other seasonal allergic rhinitis: J30.2

## 2023-03-17 LAB — LIPID PANEL
Cholesterol: 146 mg/dL (ref 0–200)
HDL: 53.2 mg/dL (ref >39.00)
LDL Cholesterol: 59 mg/dL (ref 0–99)
NonHDL: 92.93
Total CHOL/HDL Ratio: 3
Triglycerides: 172 mg/dL — ABNORMAL HIGH (ref 0.0–149.0)
VLDL: 34.4 mg/dL (ref 0.0–40.0)

## 2023-03-17 LAB — CBC WITH DIFFERENTIAL/PLATELET
Basophils Absolute: 0.1 10*3/uL (ref 0.0–0.1)
Basophils Relative: 1.2 % (ref 0.0–3.0)
Eosinophils Absolute: 0.2 10*3/uL (ref 0.0–0.7)
Eosinophils Relative: 2.6 % (ref 0.0–5.0)
HCT: 34.9 % — ABNORMAL LOW (ref 36.0–46.0)
Hemoglobin: 11.7 g/dL — ABNORMAL LOW (ref 12.0–15.0)
Lymphocytes Relative: 14.6 % (ref 12.0–46.0)
Lymphs Abs: 1 10*3/uL (ref 0.7–4.0)
MCHC: 33.6 g/dL (ref 30.0–36.0)
MCV: 95.6 fL (ref 78.0–100.0)
Monocytes Absolute: 1.1 10*3/uL — ABNORMAL HIGH (ref 0.1–1.0)
Monocytes Relative: 15.1 % — ABNORMAL HIGH (ref 3.0–12.0)
Neutro Abs: 4.7 10*3/uL (ref 1.4–7.7)
Neutrophils Relative %: 66.5 % (ref 43.0–77.0)
Platelets: 222 10*3/uL (ref 150.0–400.0)
RBC: 3.65 Mil/uL — ABNORMAL LOW (ref 3.87–5.11)
RDW: 13.6 % (ref 11.5–15.5)
WBC: 7.1 10*3/uL (ref 4.0–10.5)

## 2023-03-17 LAB — VITAMIN B12: Vitamin B-12: 491 pg/mL (ref 211–911)

## 2023-03-17 LAB — IBC + FERRITIN
Ferritin: 149.3 ng/mL (ref 10.0–291.0)
Iron: 76 ug/dL (ref 42–145)
Saturation Ratios: 24.5 % (ref 20.0–50.0)
TIBC: 310.8 ug/dL (ref 250.0–450.0)
Transferrin: 222 mg/dL (ref 212.0–360.0)

## 2023-03-17 LAB — RETICULOCYTES
ABS Retic: 43320 {cells}/uL (ref 20000–80000)
Retic Ct Pct: 1.2 %

## 2023-03-17 LAB — TSH: TSH: 3.5 u[IU]/mL (ref 0.35–5.50)

## 2023-03-17 LAB — FOLATE: Folate: >25.2 ng/mL (ref >5.9)

## 2023-03-17 MED ORDER — MONTELUKAST SODIUM 10 MG PO TABS: 10.0000 mg | ORAL_TABLET | Freq: Every day | ORAL | 1 refills | Status: DC

## 2023-03-17 MED ORDER — HYDROCODONE-ACETAMINOPHEN 10-325 MG PO TABS: 1.0000 | ORAL_TABLET | Freq: Three times a day (TID) | ORAL | 0 refills | Status: DC | PRN

## 2023-03-17 MED ORDER — AZELASTINE HCL 0.1 % NA SOLN: 2.0000 | Freq: Two times a day (BID) | NASAL | 1 refills | Status: AC

## 2023-03-17 NOTE — Patient Instructions (Signed)

## 2023-03-17 NOTE — Progress Notes (Signed)
 Subjective:  Patient ID: Brooke [name], female    DOB: 10-Nov-1945  Age: 78 y.o. MRN: [id number]  CC: Cough, Anemia, and Hyperlipidemia   HPI Brooke Denver Gomes presents for f/up ---  Discussed the use of AI scribe software for clinical note transcription with the patient, who gave verbal consent to proceed.  History of Present Illness   Brooke [name] Suzan Manon is a 78 year old female who presents with unilateral nasal and eye discharge.  She has been experiencing unilateral nasal and eye discharge for over a month. The discharge is clear and affects the right side of her face. She has tried over-the-counter medications like Zyrtec without relief and discontinued Flonase  due to irritation. No sinus pain, facial pain, or earaches are present, but she occasionally experiences rare epistaxis.  She has a dry cough that has persisted for about two weeks. There is no hemoptysis or productive sputum. She experiences some night sweats and shortness of breath at times. There have been no major changes in weight, but she mentions a slight decrease in appetite.  She is not currently taking prednisone , which she uses following her treatments every three months for five days. She has had cataract surgery in the past and is up to date with her flu shots, as managed by the cancer center.       Outpatient Medications Prior to Visit  Medication Sig Dispense Refill   acetaminophen  (TYLENOL ) 500 MG tablet Take 500 mg by mouth every 6 (six) hours as needed.     ALPRAZolam  (XANAX ) 0.5 MG tablet Take 0.5 mg by mouth 3 (three) times daily.     ASPIRIN  LOW DOSE 81 MG tablet TAKE 1 TABLET BY MOUTH EVERY DAY 90 tablet 1   Cholecalciferol (VITAMIN D3) 5000 units CAPS Take 1 capsule by mouth every other day.     cyclobenzaprine (FLEXERIL) 5 MG tablet Take 5 mg by mouth 3 (three) times daily as needed for muscle spasms.     omeprazole (PRILOSEC) 20 MG capsule TAKE 1 CAPSULE BY MOUTH  DAILY 30 MINUTES BEFORE MORNING MEAL (Patient taking differently: Take 20 mg by mouth daily.) 90 capsule 1   predniSONE  (DELTASONE ) 20 MG tablet Take 1 tablet (20 mg total) by mouth daily with breakfast. For 5 days after each Rituxan  infusion 5 tablet 5   vitamin B-12 (CYANOCOBALAMIN ) 500 MCG tablet Take 500 mcg by mouth every other day.     HYDROcodone -acetaminophen  (NORCO) 10-325 MG tablet Take 1 tablet by mouth every 8 (eight) hours as needed. (Patient taking differently: Take 1 tablet by mouth every 8 (eight) hours as needed for moderate pain (pain score 4-6) or severe pain (pain score 7-10).) 30 tablet 0   rosuvastatin  (CRESTOR ) 10 MG tablet Take 1 tablet (10 mg total) by mouth daily. 90 tablet 3   No facility-administered medications prior to visit.    ROS Review of Systems  Constitutional: Negative.  Negative for appetite change, diaphoresis and fatigue.  HENT: Negative.  Negative for sore throat and voice change.   Respiratory:  Positive for cough. Negative for chest tightness and wheezing.   Cardiovascular:  Negative for chest pain, palpitations and leg swelling.  Gastrointestinal: Negative.  Negative for abdominal pain, blood in stool, constipation, diarrhea, nausea and vomiting.  Genitourinary: Negative.  Negative for difficulty urinating.  Musculoskeletal:  Positive for back pain. Negative for arthralgias, joint swelling and myalgias.  Skin: Negative.   Neurological: Negative.  Negative for dizziness, weakness and light-headedness.  Hematological:  Negative for adenopathy. Does not bruise/bleed easily.  Psychiatric/Behavioral: Negative.      Objective:  BP 136/84 (BP Location: Left Arm, Patient Position: Sitting, Cuff Size: Normal)   Pulse 80   Temp 97.9 F (36.6 C) (Oral)   Ht 5' 3 (1.6 m)   Wt 126 lb 3.2 oz (57.2 kg)   SpO2 98%   BMI 22.36 kg/m   BP Readings from Last 3 Encounters:  03/22/23 120/71  03/22/23 (!) 141/78  03/17/23 136/84    Wt Readings from Last  3 Encounters:  03/22/23 130 lb 1.6 oz (59 kg)  03/17/23 126 lb 3.2 oz (57.2 kg)  01/10/23 133 lb (60.3 kg)    Physical Exam Vitals reviewed.  Constitutional:      Appearance: Normal appearance.  HENT:     Nose: Mucosal edema, congestion and rhinorrhea present. No nasal tenderness. Rhinorrhea is clear.     Right Turbinates: Pale.     Left Turbinates: Pale.     Right Sinus: No maxillary sinus tenderness or frontal sinus tenderness.     Left Sinus: No frontal sinus tenderness.     Mouth/Throat:     Mouth: Mucous membranes are moist.  Eyes:     General: No scleral icterus.    Conjunctiva/sclera: Conjunctivae normal.  Cardiovascular:     Rate and Rhythm: Normal rate and regular rhythm.     Heart sounds: No murmur heard.    No friction rub. No gallop.  Pulmonary:     Effort: Pulmonary effort is normal.     Breath sounds: No stridor. No wheezing, rhonchi or rales.  Abdominal:     General: Abdomen is flat.     Palpations: There is no mass.     Tenderness: There is no abdominal tenderness. There is no guarding.     Hernia: No hernia is present.  Musculoskeletal:        General: Normal range of motion.     Cervical back: Neck supple.     Right lower leg: No edema.     Left lower leg: No edema.  Lymphadenopathy:     Cervical: No cervical adenopathy.  Skin:    General: Skin is warm and dry.  Neurological:     General: No focal deficit present.     Mental Status: She is alert. Mental status is at baseline.  Psychiatric:        Mood and Affect: Mood normal.        Behavior: Behavior normal.     Lab Results  Component Value Date   WBC 7.7 03/22/2023   HGB 10.5 (L) 03/22/2023   HCT 32.4 (L) 03/22/2023   PLT 204 03/22/2023   GLUCOSE 89 03/22/2023   CHOL 146 03/17/2023   TRIG 172.0 (H) 03/17/2023   HDL 53.20 03/17/2023   LDLCALC 59 03/17/2023   ALT 10 03/22/2023   AST 17 03/22/2023   NA 138 03/22/2023   K 4.5 03/22/2023   CL 103 03/22/2023   CREATININE 1.10 (H)  03/22/2023   BUN 16 03/22/2023   CO2 29 03/22/2023   TSH 3.50 03/17/2023   INR 0.9 02/19/2020    MM 3D SCREENING MAMMOGRAM BILATERAL BREAST Result Date: 08/10/2022 CLINICAL DATA:  Screening. EXAM: DIGITAL SCREENING BILATERAL MAMMOGRAM WITH TOMOSYNTHESIS AND CAD TECHNIQUE: Bilateral screening digital craniocaudal and mediolateral oblique mammograms were obtained. Bilateral screening digital breast tomosynthesis was performed. The images were evaluated with computer-aided detection. COMPARISON:  Previous exam(s). ACR Breast Density Category c: The breasts are heterogeneously  dense, which may obscure small masses. FINDINGS: There are no findings suspicious for malignancy. IMPRESSION: No mammographic evidence of malignancy. A result letter of this screening mammogram will be mailed directly to the patient. RECOMMENDATION: Screening mammogram in one year. (Code:SM-B-01Y) BI-RADS CATEGORY  1: Negative. Electronically Signed   By: Almarie Daring M.D.   On: 08/10/2022 11:02   DG Chest 2 View Result Date: 03/17/2023 CLINICAL DATA:  Cough. EXAM: CHEST - 2 VIEW COMPARISON:  Chest radiograph dated 04/28/2020. FINDINGS: No focal consolidation, pleural effusion, or pneumothorax. The cardiac silhouette is within normal limits. No acute osseous pathology. Osteopenia with degenerative changes of the spine. IMPRESSION: No active cardiopulmonary disease. Electronically Signed   By: Vanetta Chou M.D.   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 Use in each nostril as directed  Dispense: 90 mL; Refill: 1 -     Montelukast  Sodium; Take 1 tablet (10 mg total) by mouth at bedtime.  Dispense: 90 tablet; Refill: 1  DDD (degenerative disc disease), lumbar -     HYDROcodone -Acetaminophen ; Take 1 tablet by mouth every 8 (eight) hours as needed.  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 -     IBC + Ferritin; Future -     Folate; Future -     Vitamin B12; Future -     Reticulocytes; Future -     Zinc ; Future -     Vitamin B1; Future  Subacute cough- CXR is normal. -     DG Chest 2 View; Future     Follow-up: Return in about 3 months (around 06/14/2023).  Debby Molt, MD

## 2023-03-21 ENCOUNTER — Other Ambulatory Visit: Payer: Self-pay

## 2023-03-21 DIAGNOSIS — C8318 Mantle cell lymphoma, lymph nodes of multiple sites: Secondary | ICD-10-CM

## 2023-03-22 ENCOUNTER — Inpatient Hospital Stay: Payer: Medicare Other | Attending: Hematology

## 2023-03-22 ENCOUNTER — Inpatient Hospital Stay: Payer: Medicare Other | Admitting: Hematology

## 2023-03-22 ENCOUNTER — Inpatient Hospital Stay: Payer: Medicare Other

## 2023-03-22 VITALS — BP 141/78 | HR 86 | Temp 97.5°F | Resp 16 | Wt 130.1 lb

## 2023-03-22 VITALS — BP 120/71 | HR 86 | Temp 98.1°F | Resp 18

## 2023-03-22 DIAGNOSIS — Z5112 Encounter for antineoplastic immunotherapy: Secondary | ICD-10-CM

## 2023-03-22 DIAGNOSIS — Z79899 Other long term (current) drug therapy: Secondary | ICD-10-CM | POA: Diagnosis not present

## 2023-03-22 DIAGNOSIS — C8318 Mantle cell lymphoma, lymph nodes of multiple sites: Secondary | ICD-10-CM

## 2023-03-22 DIAGNOSIS — Z7189 Other specified counseling: Secondary | ICD-10-CM

## 2023-03-22 DIAGNOSIS — D702 Other drug-induced agranulocytosis: Secondary | ICD-10-CM

## 2023-03-22 LAB — CMP (CANCER CENTER ONLY)
ALT: 10 U/L (ref 0–44)
AST: 17 U/L (ref 15–41)
Albumin: 4.2 g/dL (ref 3.5–5.0)
Alkaline Phosphatase: 59 U/L (ref 38–126)
Anion gap: 6 (ref 5–15)
BUN: 16 mg/dL (ref 8–23)
CO2: 29 mmol/L (ref 22–32)
Calcium: 9.7 mg/dL (ref 8.9–10.3)
Chloride: 103 mmol/L (ref 98–111)
Creatinine: 1.1 mg/dL — ABNORMAL HIGH (ref 0.44–1.00)
GFR, Estimated: 52 mL/min — ABNORMAL LOW (ref >60)
Glucose, Bld: 89 mg/dL (ref 70–99)
Potassium: 4.5 mmol/L (ref 3.5–5.1)
Sodium: 138 mmol/L (ref 135–145)
Total Bilirubin: 0.8 mg/dL (ref 0.0–1.2)
Total Protein: 6 g/dL — ABNORMAL LOW (ref 6.5–8.1)

## 2023-03-22 LAB — CBC WITH DIFFERENTIAL (CANCER CENTER ONLY)
Abs Immature Granulocytes: 0.04 10*3/uL (ref 0.00–0.07)
Basophils Absolute: 0.1 10*3/uL (ref 0.0–0.1)
Basophils Relative: 1 %
Eosinophils Absolute: 0.2 10*3/uL (ref 0.0–0.5)
Eosinophils Relative: 3 %
HCT: 32.4 % — ABNORMAL LOW (ref 36.0–46.0)
Hemoglobin: 10.5 g/dL — ABNORMAL LOW (ref 12.0–15.0)
Immature Granulocytes: 1 %
Lymphocytes Relative: 16 %
Lymphs Abs: 1.3 10*3/uL (ref 0.7–4.0)
MCH: 31.3 pg (ref 26.0–34.0)
MCHC: 32.4 g/dL (ref 30.0–36.0)
MCV: 96.7 fL (ref 80.0–100.0)
Monocytes Absolute: 1.2 10*3/uL — ABNORMAL HIGH (ref 0.1–1.0)
Monocytes Relative: 16 %
Neutro Abs: 4.9 10*3/uL (ref 1.7–7.7)
Neutrophils Relative %: 63 %
Platelet Count: 204 10*3/uL (ref 150–400)
RBC: 3.35 MIL/uL — ABNORMAL LOW (ref 3.87–5.11)
RDW: 13.3 % (ref 11.5–15.5)
WBC Count: 7.7 10*3/uL (ref 4.0–10.5)
nRBC: 0 % (ref 0.0–0.2)

## 2023-03-22 LAB — LACTATE DEHYDROGENASE: LDH: 181 U/L (ref 98–192)

## 2023-03-22 LAB — ZINC: Zinc: 60 ug/dL (ref 60–130)

## 2023-03-22 LAB — VITAMIN B1: Vitamin B1 (Thiamine): 19 nmol/L (ref 8–30)

## 2023-03-22 MED ORDER — ACETAMINOPHEN 325 MG PO TABS
650.0000 mg | ORAL_TABLET | Freq: Once | ORAL | Status: AC
  Administered 2023-03-22: 650 mg via ORAL
  Filled 2023-03-22: qty 2

## 2023-03-22 MED ORDER — SODIUM CHLORIDE 0.9 % IV SOLN
375.0000 mg/m2 | Freq: Once | INTRAVENOUS | Status: AC
  Administered 2023-03-22: 600 mg via INTRAVENOUS
  Filled 2023-03-22: qty 50

## 2023-03-22 MED ORDER — FAMOTIDINE IN NACL 20-0.9 MG/50ML-% IV SOLN
20.0000 mg | Freq: Once | INTRAVENOUS | Status: AC
  Administered 2023-03-22: 20 mg via INTRAVENOUS
  Filled 2023-03-22: qty 50

## 2023-03-22 MED ORDER — SODIUM CHLORIDE 0.9 % IV SOLN
Freq: Once | INTRAVENOUS | Status: AC
Start: 2023-03-22 — End: 2023-03-22

## 2023-03-22 MED ORDER — DIPHENHYDRAMINE HCL 25 MG PO CAPS
50.0000 mg | ORAL_CAPSULE | Freq: Once | ORAL | Status: AC
  Administered 2023-03-22: 50 mg via ORAL
  Filled 2023-03-22: qty 2

## 2023-03-22 MED ORDER — MONTELUKAST SODIUM 10 MG PO TABS
10.0000 mg | ORAL_TABLET | Freq: Once | ORAL | Status: AC
Start: 2023-03-22 — End: 2023-03-22
  Administered 2023-03-22: 10 mg via ORAL
  Filled 2023-03-22: qty 1

## 2023-03-22 MED ORDER — SODIUM CHLORIDE 0.9 % IV SOLN
12.0000 mg | Freq: Once | INTRAVENOUS | Status: AC
  Administered 2023-03-22: 12 mg via INTRAVENOUS
  Filled 2023-03-22: qty 1.2

## 2023-03-22 MED ORDER — ACETAMINOPHEN 325 MG PO TABS
ORAL_TABLET | ORAL | Status: AC
  Filled 2023-03-22: qty 1

## 2023-03-22 NOTE — Patient Instructions (Signed)
[facility] - A DEPT OF [facility]   Discharge Instructions: Thank you for choosing [facility] to provide your oncology and hematology care.   If you have a lab appointment with the Cancer Center, please go directly to the Cancer Center and check in at the registration area.   Wear comfortable clothing and clothing appropriate for easy access to any Portacath or PICC line.   We strive to give you quality time with your provider. You may need to reschedule your appointment if you arrive late (15 or more minutes).  Arriving late affects you and other patients whose appointments are after yours.  Also, if you miss three or more appointments without notifying the office, you may be dismissed from the clinic at the provider's discretion.      For prescription refill requests, have your pharmacy contact our office and allow 72 hours for refills to be completed.    Today you received the following chemotherapy and/or immunotherapy agents: Rituximab (Rituxan)      To help prevent nausea and vomiting after your treatment, we encourage you to take your nausea medication as directed.  BELOW ARE SYMPTOMS THAT SHOULD BE REPORTED IMMEDIATELY: *FEVER GREATER THAN 100.4 F (38 C) OR HIGHER *CHILLS OR SWEATING *NAUSEA AND VOMITING THAT IS NOT CONTROLLED WITH YOUR NAUSEA MEDICATION *UNUSUAL SHORTNESS OF BREATH *UNUSUAL BRUISING OR BLEEDING *URINARY PROBLEMS (pain or burning when urinating, or frequent urination) *BOWEL PROBLEMS (unusual diarrhea, constipation, pain near the anus) TENDERNESS IN MOUTH AND THROAT WITH OR WITHOUT PRESENCE OF ULCERS (sore throat, sores in mouth, or a toothache) UNUSUAL RASH, SWELLING OR PAIN  UNUSUAL VAGINAL DISCHARGE OR ITCHING   Items with * indicate a potential emergency and should be followed up as soon as possible or go to the Emergency Department if any problems should occur.  Please show the CHEMOTHERAPY ALERT CARD or  IMMUNOTHERAPY ALERT CARD at check-in to the Emergency Department and triage nurse.  Should you have questions after your visit or need to cancel or reschedule your appointment, please contact [facility] - A DEPT OF [facility]  Dept: [phone]  and follow the prompts.  Office hours are 8:00 a.m. to 4:30 p.m. Monday - Friday. Please note that voicemails left after 4:00 p.m. may not be returned until the following business day.  We are closed weekends and major holidays. You have access to a nurse at all times for urgent questions. Please call the main number to the clinic Dept: [phone] and follow the prompts.   For any non-urgent questions, you may also contact your provider using MyChart. We now offer e-Visits for anyone 74 and older to request care online for non-urgent symptoms. For details visit [email or web address].   Also download the MyChart app! Go to the app store, search "MyChart", open the app, select [place], and log in with your MyChart username and password.

## 2023-03-22 NOTE — Progress Notes (Signed)
Patient seen by Dr. Addison Naegeli are within treatment parameters.  Labs reviewed: and are within treatment parameters.  Per physician team, patient is ready for treatment and there are NO modifications to the treatment plan.

## 2023-03-22 NOTE — Progress Notes (Signed)
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 is here for continued evaluation and management of mantle cell lymphoma and her next dose of maintenance Rituxan therapy. She is here for her maintenance Rituxan.  Patient was last seen by me on 12/21/2022 and she was doing well overall.   Patient notes she has been doing well overall since our last visit. She does complain of slightly worsening fatigue, which she contributes it to cold weather.   She reports of mild congestion and "watery" eyes. She has been taking allergy medication, which has been helping her symptoms.   She denies any new infection issues, fever, chills, night sweats, unexpected weight loss, back pain, abdominal pain, chest pain, or leg swelling.   Patient notes she has been tolerating her maintenance Rituxan treatment well without any new or severe toxicities.   She is  currently taking B-Complex every other day.   Patient is UTD with influenza vaccine, COVID-19 Booster, and other age related vaccines.   REVIEW OF SYSTEMS:   10 Point review of Systems was done is negative except as noted above.  Past Medical History:  Diagnosis Date   Allergy    Arthritis    Back pain    Complication of anesthesia    bp dropped yrs ago, recent surgeries went ok   Dysrhythmia    occ, no meds for   Gastritis    GERD (gastroesophageal reflux disease)    H. pylori infection    Headache    migraines occ   Hiatal hernia    Hx of adenomatous colonic polyps 11/30/2017   Hyperlipidemia    Hypothyroidism 1970's   hx of years ago, no meds for    mantle cell lymphoma    Pulmonary nodule    Shortness of breath dyspnea    with exertion, pt told comes from acid reflux    . Past Surgical History:  Procedure Laterality Date   APPENDECTOMY     COLONOSCOPY  12/07/2013   COLONOSCOPY W/ POLYPECTOMY  11/14/2017   TA   DILATION AND CURETTAGE OF UTERUS     EYE SURGERY Bilateral 07/2022   Cataract surgery   LAPAROSCOPIC CHOLECYSTECTOMY SINGLE SITE WITH INTRAOPERATIVE CHOLANGIOGRAM N/A 10/28/2015   Procedure: LAPAROSCOPIC CHOLECYSTECTOMY WITH INTRAOPERATIVE CHOLANGIOGRAM;  Surgeon: Darnell Level, MD;  Location: [facility];  Service: General;  Laterality: N/A;   LAPAROSCOPY  yrs ago   x 2   TMJ ARTHROSCOPY     TOTAL ABDOMINAL HYSTERECTOMY     complete    .  Social History   Tobacco Use   Smoking status: Never   Smokeless tobacco: Never  Vaping Use   Vaping status: Never Used  Substance Use Topics   Alcohol use: Yes    Alcohol/week: 4.0 - 5.0 standard drinks of alcohol    Types: 4 - 5 Cans of beer per week    Comment: 4-5 beers weekly-occ per pt   Drug use: No    ALLERGIES:  has no known allergies.  MEDICATIONS:  Current Outpatient Medications  Medication Sig Dispense Refill   acetaminophen (TYLENOL) 500 MG tablet Take 500 mg by mouth every 6 (six) hours as  needed.     ALPRAZolam (XANAX) 0.5 MG tablet Take 0.5 mg by mouth 3 (three) times daily.     ASPIRIN LOW DOSE 81 MG tablet TAKE 1 TABLET BY MOUTH EVERY DAY 90 tablet 1   azelastine (ASTELIN) 0.1 % nasal spray Place 2 sprays into both nostrils 2 (two) times daily. Use in each nostril as directed 90 mL 1   Cholecalciferol (VITAMIN D3) 5000 units CAPS Take 1 capsule by mouth every other day.     cyclobenzaprine (FLEXERIL) 5 MG tablet Take 5 mg by mouth 3 (three) times daily as needed for muscle spasms.     HYDROcodone-acetaminophen (NORCO) 10-325 MG tablet Take 1 tablet by mouth every 8 (eight) hours as needed. 30 tablet 0   montelukast (SINGULAIR) 10 MG tablet Take 1 tablet (10 mg total) by mouth at bedtime. 90 tablet 1   omeprazole (PRILOSEC) 20 MG capsule TAKE 1 CAPSULE BY MOUTH DAILY 30 MINUTES BEFORE MORNING MEAL (Patient taking differently: Take 20 mg by mouth daily.) 90 capsule 1   predniSONE (DELTASONE) 20 MG tablet Take 1 tablet (20 mg total) by mouth daily with breakfast. For 5 days after each Rituxan infusion 5 tablet 5   rosuvastatin (CRESTOR) 10 MG tablet Take 1 tablet (10 mg total) by mouth daily. 90 tablet 3   vitamin B-12 (CYANOCOBALAMIN) 500 MCG tablet Take 500 mcg by mouth every other day.     No current facility-administered medications for this visit.    PHYSICAL EXAMINATION: .BP (!) 141/78 (BP Location: Left Arm, Patient Position: Sitting)   Pulse 86   Temp (!) 97.5 F (36.4 C) (Temporal)   Resp 16   Wt 130 lb 1.6 oz (59 kg)   SpO2 99%   BMI 23.05 kg/m  NAD GENERAL:alert, in no acute distress and comfortable SKIN: no acute rashes, no significant lesions EYES: conjunctiva are pink and non-injected, sclera anicteric NECK: supple, no JVD LYMPH:  no palpable lymphadenopathy in the cervical, axillary or inguinal regions LUNGS: clear to auscultation b/l with normal respiratory effort HEART: regular rate & rhythm ABDOMEN:  normoactive bowel sounds , non tender, not  distended. Extremity: no pedal edema PSYCH: alert & oriented x 3 with fluent speech NEURO: no focal motor/sensory deficits  LABORATORY DATA:   I have reviewed the data as listed      Latest Ref Rng & Units 03/22/2023   10:00 AM 03/17/2023   10:57 AM 12/21/2022   10:24 AM  CBC  WBC 4.0 - 10.5 K/uL 7.7  7.1  6.1   Hemoglobin 12.0 - 15.0 g/dL 16.1  09.6  04.5   Hematocrit 36.0 - 46.0 % 32.4  34.9  32.4   Platelets 150 - 400 K/uL 204  222.0  208     .    Latest Ref Rng & Units 03/22/2023   10:00 AM 12/21/2022  10:24 AM 09/20/2022   11:26 AM  CMP  Glucose 70 - 99 mg/dL 89  91  95   BUN 8 - 23 mg/dL 16  12  13    Creatinine 0.44 - 1.00 mg/dL 1.61  0.96  0.45   Sodium 135 - 145 mmol/L 138  141  143   Potassium 3.5 - 5.1 mmol/L 4.5  4.0  3.7   Chloride 98 - 111 mmol/L 103  106  110   CO2 22 - 32 mmol/L 29  29  26    Calcium 8.9 - 10.3 mg/dL 9.7  9.4  9.3   Total Protein 6.5 - 8.1 g/dL 6.0  6.2  6.4   Total Bilirubin 0.0 - 1.2 mg/dL 0.8  0.9  0.7   Alkaline Phos 38 - 126 U/L 59  62  60   AST 15 - 41 U/L 17  17  19    ALT 0 - 44 U/L 10  10  11     . Lab Results  Component Value Date   LDH 181 03/22/2023   RADIOGRAPHIC STUDIES: I have personally reviewed the radiological images as listed and agreed with the findings in the report. DG Chest 2 View Result Date: 03/17/2023 CLINICAL DATA:  Cough. EXAM: CHEST - 2 VIEW COMPARISON:  Chest radiograph dated 04/28/2020. FINDINGS: No focal consolidation, pleural effusion, or pneumothorax. The cardiac silhouette is within normal limits. No acute osseous pathology. Osteopenia with degenerative changes of the spine. IMPRESSION: No active cardiopulmonary disease. Electronically Signed   By: Elgie Collard M.D.   On: 03/17/2023 11:52     ASSESSMENT & PLAN:   78 yo with   1) Stage III/IV Mantle cell lymphoma 01/11/2020 PET/CT [phone]) revealed no visible disease, continued resolution. 2) s/p hypersensitivity reaction to Rapid Rituxan. -  rash and low grade fever -  resolved with solumedrol/tylenol, benadryl and famotidine   PLAN: -Discussed lab results from today, 03/22/2023, in detail with the patient. CBC shows patient is anemic with low Hgb of 10.5 g/dL with Hct of 82.9%. CMP shows elevated Creatinine of 1.10. -Discussed with the patient that July 2025 will be 3 years of her maintenance Rituxan treatment. Discussed with the patient that we can either continue with her maintenance treatment or hold treatment and watch with labs after July. Patient will think about it and let us know later.  -Patient has no clinical symptoms suggestive of mantle cell lymphoma at this time. -Patient tolerated her previous cycle of Rituxan well without any new or severe toxicities.  -Patient can continue Rituxan treatment today without any dose modification.  -continue same supportive medications. -Rituxan orders reviewed and signed. -Continue her B12 and vitamin D supplements.   FOLLOW-UP: Per integrated scheduling - plz schedule next 3 maintenance Rituxan treatment with labs and MD visits   The total time spent in the appointment was 30 minutes* .  All of the patient's questions were answered with apparent satisfaction. The patient knows to call the clinic with any problems, questions or concerns.   Wyvonnia Lora MD MS AAHIVMS [facility] Hematology/Oncology Physician [facility]  .*Total Encounter Time as defined by the Centers for Medicare and Medicaid Services includes, in addition to the face-to-face time of a patient visit (documented in the note above) non-face-to-face time: obtaining and reviewing outside history, ordering and reviewing medications, tests or procedures, care coordination (communications with other health care professionals or caregivers) and documentation in the medical record.   I,Param Shah,acting as a Neurosurgeon for [employer]  Candise Che, MD.,have documented all relevant documentation on the behalf of Wyvonnia Lora,  MD,as directed by  Wyvonnia Lora, MD while in the presence of Wyvonnia Lora, MD.  .I have reviewed the above documentation for accuracy and completeness, and I agree with the above. Johney Maine MD

## 2023-03-23 ENCOUNTER — Encounter: Payer: Self-pay | Admitting: Internal Medicine

## 2023-03-25 DIAGNOSIS — H04123 Dry eye syndrome of bilateral lacrimal glands: Secondary | ICD-10-CM | POA: Diagnosis not present

## 2023-03-25 DIAGNOSIS — H1132 Conjunctival hemorrhage, left eye: Secondary | ICD-10-CM | POA: Diagnosis not present

## 2023-03-28 ENCOUNTER — Encounter: Payer: Self-pay | Admitting: Hematology

## 2023-03-31 ENCOUNTER — Encounter (HOSPITAL_BASED_OUTPATIENT_CLINIC_OR_DEPARTMENT_OTHER): Payer: Self-pay

## 2023-04-05 ENCOUNTER — Institutional Professional Consult (permissible substitution) (HOSPITAL_BASED_OUTPATIENT_CLINIC_OR_DEPARTMENT_OTHER): Payer: Medicare Other | Admitting: Internal Medicine

## 2023-04-05 ENCOUNTER — Ambulatory Visit (HOSPITAL_BASED_OUTPATIENT_CLINIC_OR_DEPARTMENT_OTHER): Payer: Medicare Other | Admitting: Internal Medicine

## 2023-04-05 VITALS — BP 120/80 | HR 94 | Ht 63.0 in | Wt 127.0 lb

## 2023-04-05 DIAGNOSIS — E7841 Elevated Lipoprotein(a): Secondary | ICD-10-CM | POA: Diagnosis not present

## 2023-04-05 DIAGNOSIS — E785 Hyperlipidemia, unspecified: Secondary | ICD-10-CM | POA: Diagnosis not present

## 2023-04-05 DIAGNOSIS — I2510 Atherosclerotic heart disease of native coronary artery without angina pectoris: Secondary | ICD-10-CM

## 2023-04-05 NOTE — Progress Notes (Signed)
 LIPID CLINIC CONSULT NOTE  Chief Complaint:  Elevated LP(a)  Primary Care Physician: Brooke Grandchild, MD  Primary Cardiologist:  Brooke Balsam, MD  HPI:  Brooke [name] is a 78 y.o. female who is being seen today for the evaluation of elevated LP(a) at the request of Brooke Lea, MD. this is a pleasant 78 year old female kindly referred for evaluation management of elevated LP(a).  She has a history of mantle cell lymphoma and underwent chemotherapy for this.  She says she has been in remission for about 3 years.  As part of her workup she had CT scans which showed multivessel coronary artery calcification.  She has been on statin therapy recently increased from 5 to 10 mg rosuvastatin daily.  Her last lipid profile showed total cholesterol 146, triglycerides 172, HDL 53 and LDL 59 which is well treated at target LDL less than 70.  Her LP(a) was assessed and is elevated at 214 nmol/L.  She tells me that multiple family members have cardiovascular disease and high cholesterol.  She is also accompanied by her sister today who has high cholesterol as well.  I informed them that they may wish to have other family members tested for elevated LP(a).  They mention that their family has a small gene pool.  PMHx:  Past Medical History:  Diagnosis Date   Allergy    Arthritis    Back pain    Complication of anesthesia    bp dropped yrs ago, recent surgeries went ok   Dysrhythmia    occ, no meds for   Gastritis    GERD (gastroesophageal reflux disease)    H. pylori infection    Headache    migraines occ   Hiatal hernia    Hx of adenomatous colonic polyps 11/30/2017   Hyperlipidemia    Hypothyroidism 1970's   hx of years ago, no meds for    mantle cell lymphoma    Pulmonary nodule    Shortness of breath dyspnea    with exertion, pt told comes from acid reflux    Past Surgical History:  Procedure Laterality Date   APPENDECTOMY     COLONOSCOPY  12/07/2013    COLONOSCOPY W/ POLYPECTOMY  11/14/2017   TA   DILATION AND CURETTAGE OF UTERUS     EYE SURGERY Bilateral 07/2022   Cataract surgery   LAPAROSCOPIC CHOLECYSTECTOMY SINGLE SITE WITH INTRAOPERATIVE CHOLANGIOGRAM N/A 10/28/2015   Procedure: LAPAROSCOPIC CHOLECYSTECTOMY WITH INTRAOPERATIVE CHOLANGIOGRAM;  Surgeon: Brooke Level, MD;  Location: [facility];  Service: General;  Laterality: N/A;   LAPAROSCOPY  yrs ago   x 2   TMJ ARTHROSCOPY     TOTAL ABDOMINAL HYSTERECTOMY     complete    FAMHx:  Family History  Problem Relation Age of Onset   Heart failure Mother    Colon cancer Father        early 12's   Ovarian cancer Sister    Colon polyps Sister    Kidney cancer Brother    Colon polyps Brother    Lung cancer Brother    Colon cancer Paternal Grandfather        unknown age of onset   Breast cancer Maternal Aunt    Breast cancer Cousin    Colon cancer Cousin    Rectal cancer Neg Hx    Stomach cancer Neg Hx    Esophageal cancer Neg Hx     SOCHx:   reports that she has never smoked. She has never  used smokeless tobacco. She reports current alcohol use of about 4.0 - 5.0 standard drinks of alcohol per week. She reports that she does not use drugs.  ALLERGIES:  No Known Allergies  ROS: Pertinent items noted in HPI and remainder of comprehensive ROS otherwise negative.  HOME MEDS: Current Outpatient Medications on File Prior to Visit  Medication Sig Dispense Refill   acetaminophen (TYLENOL) 500 MG tablet Take 500 mg by mouth every 6 (six) hours as needed.     ALPRAZolam (XANAX) 0.5 MG tablet Take 0.5 mg by mouth 3 (three) times daily.     ASPIRIN LOW DOSE 81 MG tablet TAKE 1 TABLET BY MOUTH EVERY DAY 90 tablet 1   azelastine (ASTELIN) 0.1 % nasal spray Place 2 sprays into both nostrils 2 (two) times daily. Use in each nostril as directed 90 mL 1   Cholecalciferol (VITAMIN D3) 5000 units CAPS Take 1 capsule by mouth every other day.     cyclobenzaprine (FLEXERIL) 5 MG tablet Take 5  mg by mouth 3 (three) times daily as needed for muscle spasms.     HYDROcodone-acetaminophen (NORCO) 10-325 MG tablet Take 1 tablet by mouth every 8 (eight) hours as needed. 30 tablet 0   montelukast (SINGULAIR) 10 MG tablet Take 1 tablet (10 mg total) by mouth at bedtime. 90 tablet 1   omeprazole (PRILOSEC) 20 MG capsule TAKE 1 CAPSULE BY MOUTH DAILY 30 MINUTES BEFORE MORNING MEAL (Patient taking differently: Take 20 mg by mouth daily.) 90 capsule 1   predniSONE (DELTASONE) 20 MG tablet Take 1 tablet (20 mg total) by mouth daily with breakfast. For 5 days after each Rituxan infusion 5 tablet 5   vitamin B-12 (CYANOCOBALAMIN) 500 MCG tablet Take 500 mcg by mouth every other day.     rosuvastatin (CRESTOR) 10 MG tablet Take 1 tablet (10 mg total) by mouth daily. 90 tablet 3   No current facility-administered medications on file prior to visit.    LABS/IMAGING: No results found for this or any previous visit (from the past 48 hours). No results found.  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   Brooke [name] has an elevated LP(a) but is well treated on rosuvastatin.  She is now a target LDL less than 70.  I do not think she would qualify for PCSK9 inhibitor.  She may be  a candidate for an upcoming clinical trial for specific LP(a) lowering.  Will hold onto her name for this trial as she expressed interest in the study today.  Plan otherwise follow-up with me as needed.  Thanks again for the kind referral.  Brooke Nose, MD, [facility],  [facility]  [facility]  Medical Director of the Advanced Lipid Disorders &  Cardiovascular Risk Reduction Clinic Diplomate of the American Board of Clinical Lipidology Attending Cardiologist  Direct Dial: [phone]  Fax: [phone]  Website:  [email or web address] 04/05/2023, 3:59 PM

## 2023-04-05 NOTE — Patient Instructions (Signed)
 Medication Instructions:  NO CHANGES  Follow-Up: At [facility], you and your health needs are our priority.  As part of our continuing mission to provide you with exceptional heart care, we have created designated Provider Care Teams.  These Care Teams include your primary Cardiologist (physician) and Advanced Practice Providers (APPs -  Physician Assistants and Nurse Practitioners) who all work together to provide you with the care you need, when you need it.  We recommend signing up for the patient portal called "MyChart".  Sign up information is provided on this After Visit Summary.  MyChart is used to connect with patients for Virtual Visits (Telemedicine).  Patients are able to view lab/test results, encounter notes, upcoming appointments, etc.  Non-urgent messages can be sent to your provider as well.   To learn more about what you can do with MyChart, go to [email or web address].    Your next appointment:   AS NEEDED with Dr. Rennis Golden for lipid management   Dr. Rennis Golden will keep your name on a list for a [facility] clinical trial

## 2023-04-14 ENCOUNTER — Ambulatory Visit: Payer: Medicare Other | Admitting: Internal Medicine

## 2023-05-12 ENCOUNTER — Ambulatory Visit
Admission: RE | Admit: 2023-05-12 | Discharge: 2023-05-12 | Disposition: A | Discharge status: Home / Self Care | Payer: Medicare Other | Source: Ambulatory Visit | Attending: Internal Medicine | Admitting: Internal Medicine

## 2023-05-12 ENCOUNTER — Encounter: Payer: Self-pay | Admitting: Internal Medicine

## 2023-05-12 DIAGNOSIS — N958 Other specified menopausal and perimenopausal disorders: Secondary | ICD-10-CM | POA: Diagnosis not present

## 2023-05-12 DIAGNOSIS — Z90722 Acquired absence of ovaries, bilateral: Secondary | ICD-10-CM | POA: Diagnosis not present

## 2023-05-12 DIAGNOSIS — Z0000 Encounter for general adult medical examination without abnormal findings: Secondary | ICD-10-CM

## 2023-05-12 DIAGNOSIS — E2839 Other primary ovarian failure: Secondary | ICD-10-CM | POA: Diagnosis not present

## 2023-05-12 DIAGNOSIS — M8588 Other specified disorders of bone density and structure, other site: Secondary | ICD-10-CM | POA: Diagnosis not present

## 2023-05-12 DIAGNOSIS — Z780 Asymptomatic menopausal state: Secondary | ICD-10-CM

## 2023-05-17 DIAGNOSIS — K080 Exfoliation of teeth due to systemic causes: Secondary | ICD-10-CM | POA: Diagnosis not present

## 2023-06-10 ENCOUNTER — Encounter: Payer: Self-pay | Admitting: Hematology

## 2023-06-13 ENCOUNTER — Other Ambulatory Visit: Payer: Self-pay | Admitting: *Deleted

## 2023-06-13 ENCOUNTER — Other Ambulatory Visit: Payer: Self-pay | Admitting: Hematology

## 2023-06-13 DIAGNOSIS — C8318 Mantle cell lymphoma, lymph nodes of multiple sites: Secondary | ICD-10-CM

## 2023-06-14 ENCOUNTER — Ambulatory Visit (INDEPENDENT_AMBULATORY_CARE_PROVIDER_SITE_OTHER): Payer: Medicare Other | Admitting: Internal Medicine

## 2023-06-14 ENCOUNTER — Encounter: Payer: Self-pay | Admitting: Hematology

## 2023-06-14 ENCOUNTER — Encounter: Payer: Self-pay | Admitting: Internal Medicine

## 2023-06-14 VITALS — BP 162/92 | HR 79 | Temp 98.3°F | Resp 16 | Ht 62.0 in | Wt 128.4 lb

## 2023-06-14 DIAGNOSIS — E7841 Elevated Lipoprotein(a): Secondary | ICD-10-CM

## 2023-06-14 DIAGNOSIS — I700 Atherosclerosis of aorta: Secondary | ICD-10-CM | POA: Diagnosis not present

## 2023-06-14 DIAGNOSIS — M51369 Other intervertebral disc degeneration, lumbar region without mention of lumbar back pain or lower extremity pain: Secondary | ICD-10-CM

## 2023-06-14 DIAGNOSIS — E785 Hyperlipidemia, unspecified: Secondary | ICD-10-CM

## 2023-06-14 DIAGNOSIS — I10 Essential (primary) hypertension: Secondary | ICD-10-CM

## 2023-06-14 HISTORY — DX: Hyperlipidemia, unspecified: E78.5

## 2023-06-14 HISTORY — DX: Essential (primary) hypertension: I10

## 2023-06-14 LAB — CBC WITH DIFFERENTIAL/PLATELET
Basophils Absolute: 0.1 10*3/uL (ref 0.0–0.1)
Basophils Relative: 1.1 % (ref 0.0–3.0)
Eosinophils Absolute: 0.1 10*3/uL (ref 0.0–0.7)
Eosinophils Relative: 1.3 % (ref 0.0–5.0)
HCT: 33.6 % — ABNORMAL LOW (ref 36.0–46.0)
Hemoglobin: 11.4 g/dL — ABNORMAL LOW (ref 12.0–15.0)
Lymphocytes Relative: 18.6 % (ref 12.0–46.0)
Lymphs Abs: 1.2 10*3/uL (ref 0.7–4.0)
MCHC: 33.9 g/dL (ref 30.0–36.0)
MCV: 95.5 fl (ref 78.0–100.0)
Monocytes Absolute: 1.1 10*3/uL — ABNORMAL HIGH (ref 0.1–1.0)
Monocytes Relative: 17.3 % — ABNORMAL HIGH (ref 3.0–12.0)
Neutro Abs: 4 10*3/uL (ref 1.4–7.7)
Neutrophils Relative %: 61.7 % (ref 43.0–77.0)
Platelets: 244 10*3/uL (ref 150.0–400.0)
RBC: 3.52 Mil/uL — ABNORMAL LOW (ref 3.87–5.11)
RDW: 13.4 % (ref 11.5–15.5)
WBC: 6.4 10*3/uL (ref 4.0–10.5)

## 2023-06-14 LAB — BASIC METABOLIC PANEL WITH GFR
BUN: 14 mg/dL (ref 6–23)
CO2: 29 meq/L (ref 19–32)
Calcium: 9.8 mg/dL (ref 8.4–10.5)
Chloride: 104 meq/L (ref 96–112)
Creatinine, Ser: 0.94 mg/dL (ref 0.40–1.20)
GFR: 58.68 mL/min — ABNORMAL LOW (ref >60.00)
Glucose, Bld: 100 mg/dL — ABNORMAL HIGH (ref 70–99)
Potassium: 4.9 meq/L (ref 3.5–5.1)
Sodium: 141 meq/L (ref 135–145)

## 2023-06-14 MED ORDER — HYDROCODONE-ACETAMINOPHEN 10-325 MG PO TABS: 1.0000 | ORAL_TABLET | Freq: Three times a day (TID) | ORAL | 0 refills | Status: DC | PRN

## 2023-06-14 MED ORDER — ROSUVASTATIN CALCIUM 10 MG PO TABS: 10.0000 mg | ORAL_TABLET | Freq: Every day | ORAL | 1 refills | Status: DC

## 2023-06-14 NOTE — Patient Instructions (Signed)
 Hypertension, Adult High blood pressure (hypertension) is when the force of blood pumping through the arteries is too strong. The arteries are the blood vessels that carry blood from the heart throughout the body. Hypertension forces the heart to work harder to pump blood and may cause arteries to become narrow or stiff. Untreated or uncontrolled hypertension can lead to a heart attack, heart failure, a stroke, kidney disease, and other problems. A blood pressure reading consists of a higher number over a lower number. Ideally, your blood pressure should be below 120/80. The first ("top") number is called the systolic pressure. It is a measure of the pressure in your arteries as your heart beats. The second ("bottom") number is called the diastolic pressure. It is a measure of the pressure in your arteries as the heart relaxes. What are the causes? The exact cause of this condition is not known. There are some conditions that result in high blood pressure. What increases the risk? Certain factors may make you more likely to develop high blood pressure. Some of these risk factors are under your control, including: Smoking. Not getting enough exercise or physical activity. Being overweight. Having too much fat, sugar, calories, or salt (sodium) in your diet. Drinking too much alcohol. Other risk factors include: Having a personal history of heart disease, diabetes, high cholesterol, or kidney disease. Stress. Having a family history of high blood pressure and high cholesterol. Having obstructive sleep apnea. Age. The risk increases with age. What are the signs or symptoms? High blood pressure may not cause symptoms. Very high blood pressure (hypertensive crisis) may cause: Headache. Fast or irregular heartbeats (palpitations). Shortness of breath. Nosebleed. Nausea and vomiting. Vision changes. Severe chest pain, dizziness, and seizures. How is this diagnosed? This condition is diagnosed by  measuring your blood pressure while you are seated, with your arm resting on a flat surface, your legs uncrossed, and your feet flat on the floor. The cuff of the blood pressure monitor will be placed directly against the skin of your upper arm at the level of your heart. Blood pressure should be measured at least twice using the same arm. Certain conditions can cause a difference in blood pressure between your right and left arms. If you have a high blood pressure reading during one visit or you have normal blood pressure with other risk factors, you may be asked to: Return on a different day to have your blood pressure checked again. Monitor your blood pressure at home for 1 week or longer. If you are diagnosed with hypertension, you may have other blood or imaging tests to help your health care provider understand your overall risk for other conditions. How is this treated? This condition is treated by making healthy lifestyle changes, such as eating healthy foods, exercising more, and reducing your alcohol intake. You may be referred for counseling on a healthy diet and physical activity. Your health care provider may prescribe medicine if lifestyle changes are not enough to get your blood pressure under control and if: Your systolic blood pressure is above 130. Your diastolic blood pressure is above 80. Your personal target blood pressure may vary depending on your medical conditions, your age, and other factors. Follow these instructions at home: Eating and drinking  Eat a diet that is high in fiber and potassium, and low in sodium, added sugar, and fat. An example of this eating plan is called the DASH diet. DASH stands for Dietary Approaches to Stop Hypertension. To eat this way: Eat  plenty of fresh fruits and vegetables. Try to fill one half of your plate at each meal with fruits and vegetables. Eat whole grains, such as whole-wheat pasta, brown rice, or whole-grain bread. Fill about one  fourth of your plate with whole grains. Eat or drink low-fat dairy products, such as skim milk or low-fat yogurt. Avoid fatty cuts of meat, processed or cured meats, and poultry with skin. Fill about one fourth of your plate with lean proteins, such as fish, chicken without skin, beans, eggs, or tofu. Avoid pre-made and processed foods. These tend to be higher in sodium, added sugar, and fat. Reduce your daily sodium intake. Many people with hypertension should eat less than 1,500 mg of sodium a day. Do not drink alcohol if: Your health care provider tells you not to drink. You are pregnant, may be pregnant, or are planning to become pregnant. If you drink alcohol: Limit how much you have to: 0-1 drink a day for women. 0-2 drinks a day for men. Know how much alcohol is in your drink. In the [place] one drink equals one 12 oz bottle of beer (355 mL), one 5 oz glass of wine (148 mL), or one 1 oz glass of hard liquor (44 mL). Lifestyle  Work with your health care provider to maintain a healthy body weight or to lose weight. Ask what an ideal weight is for you. Get at least 30 minutes of exercise that causes your heart to beat faster (aerobic exercise) most days of the week. Activities may include walking, swimming, or biking. Include exercise to strengthen your muscles (resistance exercise), such as Pilates or lifting weights, as part of your weekly exercise routine. Try to do these types of exercises for 30 minutes at least 3 days a week. Do not use any products that contain nicotine or tobacco. These products include cigarettes, chewing tobacco, and vaping devices, such as e-cigarettes. If you need help quitting, ask your health care provider. Monitor your blood pressure at home as told by your health care provider. Keep all follow-up visits. This is important. Medicines Take over-the-counter and prescription medicines only as told by your health care provider. Follow directions carefully. Blood  pressure medicines must be taken as prescribed. Do not skip doses of blood pressure medicine. Doing this puts you at risk for problems and can make the medicine less effective. Ask your health care provider about side effects or reactions to medicines that you should watch for. Contact a health care provider if you: Think you are having a reaction to a medicine you are taking. Have headaches that keep coming back (recurring). Feel dizzy. Have swelling in your ankles. Have trouble with your vision. Get help right away if you: Develop a severe headache or confusion. Have unusual weakness or numbness. Feel faint. Have severe pain in your chest or abdomen. Vomit repeatedly. Have trouble breathing. These symptoms may be an emergency. Get help right away. Call 911. Do not wait to see if the symptoms will go away. Do not drive yourself to the hospital. Summary Hypertension is when the force of blood pumping through your arteries is too strong. If this condition is not controlled, it may put you at risk for serious complications. Your personal target blood pressure may vary depending on your medical conditions, your age, and other factors. For most people, a normal blood pressure is less than 120/80. Hypertension is treated with lifestyle changes, medicines, or a combination of both. Lifestyle changes include losing weight, eating a healthy,  low-sodium diet, exercising more, and limiting alcohol. This information is not intended to replace advice given to you by your health care provider. Make sure you discuss any questions you have with your health care provider. Document Revised: 12/02/2020 Document Reviewed: 12/02/2020 Elsevier Patient Education  2024 [employer].

## 2023-06-14 NOTE — Progress Notes (Unsigned)
 Subjective:  Patient ID: Brooke [name], female    DOB: 22-Dec-1945  Age: 78 y.o. MRN: [id number]  CC: Hypertension and Back Pain   HPI Amayrany Sulaiman presents for f/up-   Discussed the use of AI scribe software for clinical note transcription with the patient, who gave verbal consent to proceed.  History of Present Illness   Brooke [name] "Brooke [name]" is a 78 year old female who presents with back pain and high blood pressure.  She experiences significant back pain, which she attributes to frequently carrying her grandson. The pain is localized to her back and thighs, without radiation to her legs or feet. No numbness, weakness, or tingling in her legs or feet. She takes hydrocodone  for pain relief, sometimes requiring two doses a day. She inquires about using ibuprofen 600 mg for additional pain management.  She experiences tension headaches, starting in the back of her neck and extending across her forehead, which she attributes to stress from having family living with her after being alone for four years. No associated nausea, vomiting, slurred speech, or numbness.  Her blood pressure has been high and fluctuating since chemotherapy treatments.       Outpatient Medications Prior to Visit  Medication Sig Dispense Refill   ALPRAZolam  (XANAX ) 0.5 MG tablet Take 0.5 mg by mouth 3 (three) times daily.     ASPIRIN  LOW DOSE 81 MG tablet TAKE 1 TABLET BY MOUTH EVERY DAY 90 tablet 1   azelastine  (ASTELIN ) 0.1 % nasal spray Place 2 sprays into both nostrils 2 (two) times daily. Use in each nostril as directed 90 mL 1   Cholecalciferol (VITAMIN D3) 5000 units CAPS Take 1 capsule by mouth every other day.     cyclobenzaprine (FLEXERIL) 5 MG tablet Take 5 mg by mouth 3 (three) times daily as needed for muscle spasms.     montelukast  (SINGULAIR ) 10 MG tablet Take 1 tablet (10 mg total) by mouth at bedtime. 90 tablet 1   omeprazole (PRILOSEC) 20 MG capsule TAKE 1  CAPSULE BY MOUTH DAILY 30 MINUTES BEFORE MORNING MEAL (Patient taking differently: Take 20 mg by mouth daily.) 90 capsule 1   predniSONE  (DELTASONE ) 20 MG tablet TAKE 1 TABLET (20 MG TOTAL) BY MOUTH DAILY WITH BREAKFAST FOR 5 DAYS AFTER EACH RITUXAN  INFUSION 5 tablet 6   vitamin B-12 (CYANOCOBALAMIN ) 500 MCG tablet Take 500 mcg by mouth every other day.     acetaminophen  (TYLENOL ) 500 MG tablet Take 500 mg by mouth every 6 (six) hours as needed.     HYDROcodone -acetaminophen  (NORCO) 10-325 MG tablet Take 1 tablet by mouth every 8 (eight) hours as needed. 30 tablet 0   rosuvastatin  (CRESTOR ) 10 MG tablet Take 1 tablet (10 mg total) by mouth daily. 90 tablet 3   No facility-administered medications prior to visit.    ROS Review of Systems  Musculoskeletal:  Positive for back pain and neck pain. Negative for arthralgias, joint swelling, myalgias and neck stiffness.       She has been taking bufferin for MSK pain  Skin: Negative.   Neurological:  Positive for headaches.  Hematological:  Negative for adenopathy. Does not bruise/bleed easily.  Psychiatric/Behavioral:  The patient is nervous/anxious.     Objective:  BP (!) 162/92 (BP Location: Left Arm, Patient Position: Sitting, Cuff Size: Normal)   Pulse 79   Temp 98.3 F (36.8 C) (Oral)   Resp 16   Ht 5\' 2"  (1.575 m)   Wt 128 lb 6.4  oz (58.2 kg)   SpO2 91%   BMI 23.48 kg/m   BP Readings from Last 3 Encounters:  06/14/23 (!) 162/92  04/05/23 120/80  03/22/23 120/71    Wt Readings from Last 3 Encounters:  06/14/23 128 lb 6.4 oz (58.2 kg)  04/05/23 127 lb (57.6 kg)  03/22/23 130 lb 1.6 oz (59 kg)    Physical Exam Vitals reviewed.  Constitutional:      Appearance: Normal appearance.  HENT:     Nose: Nose normal.     Mouth/Throat:     Mouth: Mucous membranes are moist.  Eyes:     General: No scleral icterus.    Conjunctiva/sclera: Conjunctivae normal.  Cardiovascular:     Rate and Rhythm: Normal rate and regular rhythm.      Heart sounds: No murmur heard.    No friction rub. No gallop.     Comments: EKG--- NSR, 77 bpm No LVH, Q waves, or ST/T wave changes  Pulmonary:     Effort: Pulmonary effort is normal.     Breath sounds: No stridor. No wheezing, rhonchi or rales.  Abdominal:     General: Abdomen is flat.     Palpations: There is no mass.     Tenderness: There is no abdominal tenderness. There is no guarding.     Hernia: No hernia is present.  Musculoskeletal:        General: Normal range of motion.     Cervical back: Neck supple.     Right lower leg: No edema.     Left lower leg: No edema.  Lymphadenopathy:     Cervical: No cervical adenopathy.  Skin:    General: Skin is warm and dry.  Neurological:     General: No focal deficit present.     Mental Status: She is alert. Mental status is at baseline.  Psychiatric:        Mood and Affect: Mood normal.        Behavior: Behavior normal.     Lab Results  Component Value Date   WBC 7.7 03/22/2023   HGB 10.5 (L) 03/22/2023   HCT 32.4 (L) 03/22/2023   PLT 204 03/22/2023   GLUCOSE 89 03/22/2023   CHOL 146 03/17/2023   TRIG 172.0 (H) 03/17/2023   HDL 53.20 03/17/2023   LDLCALC 59 03/17/2023   ALT 10 03/22/2023   AST 17 03/22/2023   NA 138 03/22/2023   K 4.5 03/22/2023   CL 103 03/22/2023   CREATININE 1.10 (H) 03/22/2023   BUN 16 03/22/2023   CO2 29 03/22/2023   TSH 3.50 03/17/2023   INR 0.9 02/19/2020    DG Bone Density Result Date: 05/12/2023 EXAM: DUAL X-RAY ABSORPTIOMETRY (DXA) FOR BONE MINERAL DENSITY IMPRESSION: Referring Physician:  Arcadio Knuckles Your patient completed a bone mineral density test using GE Lunar iDXA system (analysis version: 16). Technologist: KAT PATIENT: Name: Brooke, [name] Patient ID: [id number] Birth Date: 1945/12/14 Height: 60.2 in. Sex: Female Measured: 05/12/2023 Weight: 125.0 lbs. Indications: Advanced Age, Bilateral Ovariectomy, Caucasian, Early Menopause (256.31), Estrogen Deficient, Height Loss  (781.91), History of Osteopenia, Hysterectomy, Non-Hodgkins Lymphoma, Omeprazole, Postmenopausal, Secondary Osteoporosis Fractures: None Treatments: Vitamin D (E933.5) ASSESSMENT: The BMD measured at Femur Neck Left is 0.718 g/cm2 with a T-score of -2.3. This patient is considered osteopenic/low bone mass according to World Health Organization [facility]) criteria. The quality of the exam is good. Site Region Measured Date Measured Age YA BMD Significant CHANGE T-score DualFemur Neck Left  6/12/2024  77.0         -2.3    0.718 g/cm2 DualFemur Neck Left  08/26/2015    69.3         -2.1    0.752 g/cm2 AP Spine L1-L4 05/12/2023 77.0 -1.1 1.049 g/cm2 * AP Spine  L1-L4      08/26/2015    69.3         -1.8    0.971 g/cm2 DualFemur Total Mean 05/12/2023 77.0 -1.7 0.789 g/cm2 * DualFemur Total Mean 08/26/2015    69.3         -1.4    0.830 g/cm2 World Health Organization [facility]) criteria for post-menopausal, Caucasian Women: Normal       T-score at or above -1 SD Osteopenia   T-score between -1 and -2.5 SD Osteoporosis T-score at or below -2.5 SD RECOMMENDATION: 1. All patients should optimize calcium  and vitamin D intake. 2. Consider FDA-approved medical therapies in postmenopausal women and men aged 17 years and older, based on the following: a. A hip or vertebral (clinical or morphometric) fracture. b. T-score = -2.5 at the femoral neck or spine after appropriate evaluation to exclude secondary causes. c. Low bone mass (T-score between -1.0 and -2.5 at the femoral neck or spine) and a 10-year probability of a hip fracture = 3% or a 10-year probability of a major osteoporosis-related fracture = 20% based on the [place] -adapted WHO algorithm. d. Clinician judgment and/or patient preferences may indicate treatment for people with 10-year fracture probabilities above or below these levels. FOLLOW-UP: Patients with diagnosis of osteoporosis or at high risk for fracture should have regular bone mineral density tests. Patients eligible  for Medicare are allowed routine testing every 2 years. The testing frequency can be increased to one year for patients who have rapidly progressing disease, are receiving or discontinuing medical therapy to restore bone mass, or have additional risk factors. I have reviewed this study and agree with the findings. [facility] FRAX* 10-year Probability of Fracture Based on femoral neck BMD: DualFemur (Left) Major Osteoporotic Fracture: 16.4% Hip Fracture:                5.2% Population:                  [place]  (Caucasian) Risk Factors:                Secondary Osteoporosis *FRAX is a Armed forces logistics/support/administrative officer of the [employer] of [employer] for Metabolic Bone Disease, a World Science writer (WHO) [employer]. ASSESSMENT: The probability of a major osteoporotic fracture is 16.4 % within the next ten years. The probability of a hip fracture is 5.2 % within the next ten years. Electronically Signed   By: Alinda Apley M.D.   On: 05/12/2023 15:21    Assessment & Plan:  Dyslipidemia, goal LDL below 100 -     Rosuvastatin  Calcium ; Take 1 tablet (10 mg total) by mouth daily.  Dispense: 90 tablet; Refill: 1  Atherosclerosis of aorta (HCC) -     Rosuvastatin  Calcium ; Take 1 tablet (10 mg total) by mouth daily.  Dispense: 90 tablet; Refill: 1  Hyperlipidemia with target LDL less than 130 -     Rosuvastatin  Calcium ; Take 1 tablet (10 mg total) by mouth daily.  Dispense: 90 tablet; Refill: 1  Elevated Lp(a) -     Rosuvastatin  Calcium ; Take 1 tablet (10 mg total) by mouth daily.  Dispense: 90 tablet; Refill: 1  Primary hypertension -  Basic metabolic panel with GFR; Future -     CBC with Differential/Platelet; Future -     Urinalysis, Routine w reflex microscopic; Future -     Aldosterone + renin activity w/ ratio; Future  DDD (degenerative disc disease), lumbar -     HYDROcodone -Acetaminophen ; Take 1 tablet by mouth every 8 (eight) hours as needed.  Dispense: 90  tablet; Refill: 0     Follow-up: Return in about 3 months (around 09/14/2023).  Sandra Crouch, MD

## 2023-06-15 ENCOUNTER — Encounter: Payer: Self-pay | Admitting: Hematology

## 2023-06-15 LAB — URINALYSIS, ROUTINE W REFLEX MICROSCOPIC
Bilirubin Urine: NEGATIVE
Hgb urine dipstick: NEGATIVE
Ketones, ur: NEGATIVE
Leukocytes,Ua: NEGATIVE
Nitrite: NEGATIVE
RBC / HPF: NONE SEEN (ref 0–?)
Specific Gravity, Urine: 1.01 (ref 1.000–1.030)
Total Protein, Urine: NEGATIVE
Urine Glucose: NEGATIVE
Urobilinogen, UA: 0.2 (ref 0.0–1.0)
WBC, UA: NONE SEEN (ref 0–?)
pH: 6.5 (ref 5.0–8.0)

## 2023-06-15 MED ORDER — INDAPAMIDE 1.25 MG PO TABS: 1.2500 mg | ORAL_TABLET | Freq: Every day | ORAL | 0 refills | Status: DC

## 2023-06-15 MED ORDER — AMLODIPINE BESYLATE 5 MG PO TABS: 5.0000 mg | ORAL_TABLET | Freq: Every day | ORAL | 0 refills | Status: DC

## 2023-06-16 ENCOUNTER — Encounter: Payer: Self-pay | Admitting: Internal Medicine

## 2023-06-17 ENCOUNTER — Emergency Department (HOSPITAL_COMMUNITY)

## 2023-06-17 ENCOUNTER — Emergency Department (HOSPITAL_COMMUNITY)
Admission: EM | Admit: 2023-06-17 | Discharge: 2023-06-17 | Disposition: A | Discharge status: Home / Self Care | Attending: Emergency Medicine | Admitting: Emergency Medicine

## 2023-06-17 DIAGNOSIS — Z041 Encounter for examination and observation following transport accident: Secondary | ICD-10-CM | POA: Diagnosis not present

## 2023-06-17 DIAGNOSIS — S8001XA Contusion of right knee, initial encounter: Secondary | ICD-10-CM | POA: Diagnosis not present

## 2023-06-17 DIAGNOSIS — M545 Low back pain, unspecified: Secondary | ICD-10-CM | POA: Diagnosis not present

## 2023-06-17 DIAGNOSIS — S134XXA Sprain of ligaments of cervical spine, initial encounter: Secondary | ICD-10-CM | POA: Diagnosis not present

## 2023-06-17 DIAGNOSIS — M50322 Other cervical disc degeneration at C5-C6 level: Secondary | ICD-10-CM | POA: Diagnosis not present

## 2023-06-17 DIAGNOSIS — S335XXA Sprain of ligaments of lumbar spine, initial encounter: Secondary | ICD-10-CM | POA: Diagnosis not present

## 2023-06-17 DIAGNOSIS — Z7982 Long term (current) use of aspirin: Secondary | ICD-10-CM | POA: Diagnosis not present

## 2023-06-17 DIAGNOSIS — M47812 Spondylosis without myelopathy or radiculopathy, cervical region: Secondary | ICD-10-CM | POA: Diagnosis not present

## 2023-06-17 DIAGNOSIS — M542 Cervicalgia: Secondary | ICD-10-CM | POA: Diagnosis not present

## 2023-06-17 DIAGNOSIS — Y92410 Unspecified street and highway as the place of occurrence of the external cause: Secondary | ICD-10-CM | POA: Diagnosis not present

## 2023-06-17 DIAGNOSIS — S339XXA Sprain of unspecified parts of lumbar spine and pelvis, initial encounter: Secondary | ICD-10-CM | POA: Insufficient documentation

## 2023-06-17 DIAGNOSIS — S139XXA Sprain of joints and ligaments of unspecified parts of neck, initial encounter: Secondary | ICD-10-CM

## 2023-06-17 DIAGNOSIS — M4317 Spondylolisthesis, lumbosacral region: Secondary | ICD-10-CM | POA: Diagnosis not present

## 2023-06-17 DIAGNOSIS — S8391XA Sprain of unspecified site of right knee, initial encounter: Secondary | ICD-10-CM | POA: Diagnosis not present

## 2023-06-17 DIAGNOSIS — I6529 Occlusion and stenosis of unspecified carotid artery: Secondary | ICD-10-CM | POA: Diagnosis not present

## 2023-06-17 DIAGNOSIS — Z8572 Personal history of non-Hodgkin lymphomas: Secondary | ICD-10-CM | POA: Diagnosis not present

## 2023-06-17 DIAGNOSIS — M1711 Unilateral primary osteoarthritis, right knee: Secondary | ICD-10-CM | POA: Diagnosis not present

## 2023-06-17 NOTE — ED Notes (Signed)
Patient verbalizes understanding of discharge instructions. Opportunity for questioning and answers were provided. Armband removed by staff, pt discharged from ED. Ambulated out to lobby with friend

## 2023-06-17 NOTE — Discharge Instructions (Addendum)
 Apply ice to sore areas.  Ice should be applied for 30 minutes at a time, 4 times a day.  Take acetaminophen  and/or ibuprofen as needed for pain.  Please note that if you combine ibuprofen and acetaminophen , you will get better pain relief and you get from taking either medication by itself.
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
## 2023-06-18 DIAGNOSIS — K080 Exfoliation of teeth due to systemic causes: Secondary | ICD-10-CM | POA: Diagnosis not present

## 2023-06-19 LAB — ALDOSTERONE + RENIN ACTIVITY W/ RATIO
ALDO / PRA Ratio: 12.5 ratio (ref 0.9–28.9)
Aldosterone: 2 ng/dL
Renin Activity: 0.16 ng/mL/h — ABNORMAL LOW (ref 0.25–5.82)

## 2023-06-21 ENCOUNTER — Inpatient Hospital Stay: Payer: Medicare Other

## 2023-06-21 ENCOUNTER — Inpatient Hospital Stay: Payer: Medicare Other | Attending: Hematology

## 2023-06-21 ENCOUNTER — Inpatient Hospital Stay: Payer: Medicare Other | Admitting: Hematology

## 2023-06-21 VITALS — BP 118/83 | HR 81 | Temp 97.2°F | Resp 18 | Wt 125.9 lb

## 2023-06-21 VITALS — BP 108/62 | HR 80 | Temp 98.3°F | Resp 16

## 2023-06-21 DIAGNOSIS — C8318 Mantle cell lymphoma, lymph nodes of multiple sites: Secondary | ICD-10-CM

## 2023-06-21 DIAGNOSIS — Z79899 Other long term (current) drug therapy: Secondary | ICD-10-CM | POA: Diagnosis not present

## 2023-06-21 DIAGNOSIS — Z5112 Encounter for antineoplastic immunotherapy: Secondary | ICD-10-CM | POA: Diagnosis not present

## 2023-06-21 DIAGNOSIS — D702 Other drug-induced agranulocytosis: Secondary | ICD-10-CM

## 2023-06-21 DIAGNOSIS — Z7189 Other specified counseling: Secondary | ICD-10-CM

## 2023-06-21 LAB — CBC WITH DIFFERENTIAL (CANCER CENTER ONLY)
Abs Immature Granulocytes: 0.04 10*3/uL (ref 0.00–0.07)
Basophils Absolute: 0.1 10*3/uL (ref 0.0–0.1)
Basophils Relative: 1 %
Eosinophils Absolute: 0.1 10*3/uL (ref 0.0–0.5)
Eosinophils Relative: 1 %
HCT: 37.3 % (ref 36.0–46.0)
Hemoglobin: 12.6 g/dL (ref 12.0–15.0)
Immature Granulocytes: 1 %
Lymphocytes Relative: 16 %
Lymphs Abs: 1.2 10*3/uL (ref 0.7–4.0)
MCH: 31.4 pg (ref 26.0–34.0)
MCHC: 33.8 g/dL (ref 30.0–36.0)
MCV: 93 fL (ref 80.0–100.0)
Monocytes Absolute: 1.1 10*3/uL — ABNORMAL HIGH (ref 0.1–1.0)
Monocytes Relative: 15 %
Neutro Abs: 5.1 10*3/uL (ref 1.7–7.7)
Neutrophils Relative %: 66 %
Platelet Count: 245 10*3/uL (ref 150–400)
RBC: 4.01 MIL/uL (ref 3.87–5.11)
RDW: 12.5 % (ref 11.5–15.5)
WBC Count: 7.5 10*3/uL (ref 4.0–10.5)
nRBC: 0 % (ref 0.0–0.2)

## 2023-06-21 LAB — CMP (CANCER CENTER ONLY)
ALT: 11 U/L (ref 0–44)
AST: 19 U/L (ref 15–41)
Albumin: 4.8 g/dL (ref 3.5–5.0)
Alkaline Phosphatase: 71 U/L (ref 38–126)
Anion gap: 9 (ref 5–15)
BUN: 20 mg/dL (ref 8–23)
CO2: 27 mmol/L (ref 22–32)
Calcium: 9.8 mg/dL (ref 8.9–10.3)
Chloride: 102 mmol/L (ref 98–111)
Creatinine: 1.15 mg/dL — ABNORMAL HIGH (ref 0.44–1.00)
GFR, Estimated: 49 mL/min — ABNORMAL LOW (ref >60)
Glucose, Bld: 99 mg/dL (ref 70–99)
Potassium: 4.1 mmol/L (ref 3.5–5.1)
Sodium: 138 mmol/L (ref 135–145)
Total Bilirubin: 0.9 mg/dL (ref 0.0–1.2)
Total Protein: 7 g/dL (ref 6.5–8.1)

## 2023-06-21 LAB — LACTATE DEHYDROGENASE: LDH: 170 U/L (ref 98–192)

## 2023-06-21 MED ORDER — SODIUM CHLORIDE 0.9 % IV SOLN: Freq: Once | INTRAVENOUS | Status: AC

## 2023-06-21 MED ORDER — SODIUM CHLORIDE 0.9 % IV SOLN
12.0000 mg | Freq: Once | INTRAVENOUS | Status: AC
  Administered 2023-06-21: 12 mg via INTRAVENOUS
  Filled 2023-06-21: qty 1.2

## 2023-06-21 MED ORDER — MONTELUKAST SODIUM 10 MG PO TABS
10.0000 mg | ORAL_TABLET | Freq: Once | ORAL | Status: AC
Start: 2023-06-21 — End: 2023-06-21
  Administered 2023-06-21: 10 mg via ORAL
  Filled 2023-06-21: qty 1

## 2023-06-21 MED ORDER — DIPHENHYDRAMINE HCL 25 MG PO CAPS
50.0000 mg | ORAL_CAPSULE | Freq: Once | ORAL | Status: AC
  Administered 2023-06-21: 50 mg via ORAL
  Filled 2023-06-21: qty 2

## 2023-06-21 MED ORDER — FAMOTIDINE IN NACL 20-0.9 MG/50ML-% IV SOLN
20.0000 mg | Freq: Once | INTRAVENOUS | Status: AC
  Administered 2023-06-21: 20 mg via INTRAVENOUS
  Filled 2023-06-21: qty 50

## 2023-06-21 MED ORDER — SODIUM CHLORIDE 0.9 % IV SOLN
375.0000 mg/m2 | Freq: Once | INTRAVENOUS | Status: AC
  Administered 2023-06-21: 600 mg via INTRAVENOUS
  Filled 2023-06-21: qty 10

## 2023-06-21 MED ORDER — ACETAMINOPHEN 325 MG PO TABS
650.0000 mg | ORAL_TABLET | Freq: Once | ORAL | Status: AC
  Administered 2023-06-21: 650 mg via ORAL
  Filled 2023-06-21: qty 2
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
## 2023-06-21 NOTE — Patient Instructions (Signed)
 [facility] - A DEPT OF [facility]   Discharge Instructions: Thank you for choosing [facility] to provide your oncology and hematology care.   If you have a lab appointment with the Cancer Center, please go directly to the Cancer Center and check in at the registration area.   Wear comfortable clothing and clothing appropriate for easy access to any Portacath or PICC line.   We strive to give you quality time with your provider. You may need to reschedule your appointment if you arrive late (15 or more minutes).  Arriving late affects you and other patients whose appointments are after yours.  Also, if you miss three or more appointments without notifying the office, you may be dismissed from the clinic at the provider's discretion.      For prescription refill requests, have your pharmacy contact our office and allow 72 hours for refills to be completed.    Today you received the following chemotherapy and/or immunotherapy agents: Rituximab (Rituxan)      To help prevent nausea and vomiting after your treatment, we encourage you to take your nausea medication as directed.  BELOW ARE SYMPTOMS THAT SHOULD BE REPORTED IMMEDIATELY: *FEVER GREATER THAN 100.4 F (38 C) OR HIGHER *CHILLS OR SWEATING *NAUSEA AND VOMITING THAT IS NOT CONTROLLED WITH YOUR NAUSEA MEDICATION *UNUSUAL SHORTNESS OF BREATH *UNUSUAL BRUISING OR BLEEDING *URINARY PROBLEMS (pain or burning when urinating, or frequent urination) *BOWEL PROBLEMS (unusual diarrhea, constipation, pain near the anus) TENDERNESS IN MOUTH AND THROAT WITH OR WITHOUT PRESENCE OF ULCERS (sore throat, sores in mouth, or a toothache) UNUSUAL RASH, SWELLING OR PAIN  UNUSUAL VAGINAL DISCHARGE OR ITCHING   Items with * indicate a potential emergency and should be followed up as soon as possible or go to the Emergency Department if any problems should occur.  Please show the CHEMOTHERAPY ALERT CARD or  IMMUNOTHERAPY ALERT CARD at check-in to the Emergency Department and triage nurse.  Should you have questions after your visit or need to cancel or reschedule your appointment, please contact [facility] - A DEPT OF [facility]  Dept: [phone]  and follow the prompts.  Office hours are 8:00 a.m. to 4:30 p.m. Monday - Friday. Please note that voicemails left after 4:00 p.m. may not be returned until the following business day.  We are closed weekends and major holidays. You have access to a nurse at all times for urgent questions. Please call the main number to the clinic Dept: [phone] and follow the prompts.   For any non-urgent questions, you may also contact your provider using MyChart. We now offer e-Visits for anyone 74 and older to request care online for non-urgent symptoms. For details visit [email or web address].   Also download the MyChart app! Go to the app store, search "MyChart", open the app, select [place], and log in with your MyChart username and password.

## 2023-06-22 ENCOUNTER — Telehealth: Payer: Self-pay | Admitting: Hematology

## 2023-06-22 NOTE — Telephone Encounter (Signed)
 Called patient regarding upcomming appointment

## 2023-06-27 ENCOUNTER — Ambulatory Visit: Payer: Self-pay | Admitting: *Deleted

## 2023-06-27 ENCOUNTER — Encounter: Payer: Self-pay | Admitting: Hematology
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
## 2023-06-28 ENCOUNTER — Encounter: Payer: Self-pay | Admitting: Internal Medicine

## 2023-06-28 ENCOUNTER — Ambulatory Visit (INDEPENDENT_AMBULATORY_CARE_PROVIDER_SITE_OTHER): Admitting: Internal Medicine

## 2023-06-28 VITALS — BP 116/76 | HR 79 | Temp 98.3°F | Ht 62.0 in | Wt 125.0 lb

## 2023-06-28 DIAGNOSIS — I10 Essential (primary) hypertension: Secondary | ICD-10-CM | POA: Diagnosis not present
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
## 2023-06-28 NOTE — Patient Instructions (Signed)
 Ok to stop the Lozol  and continue to monitor the BP  Please hydrate with more fluids over the next 3 days  Ok to use extra table salt with the meals over the next 3 days  Please continue all other medications as before  Please make sure to continue to monitor your BP and report any further lower Bps after the 3 days  Please have the pharmacy call with any other refills you may need.  Please keep your appointments with your specialists as you may have planned

## 2023-06-29 NOTE — Assessment & Plan Note (Addendum)
 With cns symptoms after starting lozol  likely low volume related and not tolerated well, ok to d/c this, hydrate and even small doses extra salt in the next few days, continue amlodipine  5 every day, continue to monitor BP at home closly

## 2023-07-08 DIAGNOSIS — K080 Exfoliation of teeth due to systemic causes: Secondary | ICD-10-CM | POA: Diagnosis not present

## 2023-07-18 ENCOUNTER — Other Ambulatory Visit: Payer: Self-pay | Admitting: Internal Medicine

## 2023-07-18 DIAGNOSIS — Z1231 Encounter for screening mammogram for malignant neoplasm of breast: Secondary | ICD-10-CM

## 2023-07-21 ENCOUNTER — Ambulatory Visit: Admitting: Internal Medicine

## 2023-07-21 ENCOUNTER — Encounter: Payer: Self-pay | Admitting: Internal Medicine

## 2023-07-21 VITALS — BP 134/78 | HR 72 | Temp 98.2°F | Resp 16 | Ht 62.0 in | Wt 126.2 lb

## 2023-07-21 DIAGNOSIS — I10 Essential (primary) hypertension: Secondary | ICD-10-CM | POA: Diagnosis not present

## 2023-07-21 NOTE — Patient Instructions (Signed)
 Hypertension, Adult High blood pressure (hypertension) is when the force of blood pumping through the arteries is too strong. The arteries are the blood vessels that carry blood from the heart throughout the body. Hypertension forces the heart to work harder to pump blood and may cause arteries to become narrow or stiff. Untreated or uncontrolled hypertension can lead to a heart attack, heart failure, a stroke, kidney disease, and other problems. A blood pressure reading consists of a higher number over a lower number. Ideally, your blood pressure should be below 120/80. The first ("top") number is called the systolic pressure. It is a measure of the pressure in your arteries as your heart beats. The second ("bottom") number is called the diastolic pressure. It is a measure of the pressure in your arteries as the heart relaxes. What are the causes? The exact cause of this condition is not known. There are some conditions that result in high blood pressure. What increases the risk? Certain factors may make you more likely to develop high blood pressure. Some of these risk factors are under your control, including: Smoking. Not getting enough exercise or physical activity. Being overweight. Having too much fat, sugar, calories, or salt (sodium) in your diet. Drinking too much alcohol. Other risk factors include: Having a personal history of heart disease, diabetes, high cholesterol, or kidney disease. Stress. Having a family history of high blood pressure and high cholesterol. Having obstructive sleep apnea. Age. The risk increases with age. What are the signs or symptoms? High blood pressure may not cause symptoms. Very high blood pressure (hypertensive crisis) may cause: Headache. Fast or irregular heartbeats (palpitations). Shortness of breath. Nosebleed. Nausea and vomiting. Vision changes. Severe chest pain, dizziness, and seizures. How is this diagnosed? This condition is diagnosed by  measuring your blood pressure while you are seated, with your arm resting on a flat surface, your legs uncrossed, and your feet flat on the floor. The cuff of the blood pressure monitor will be placed directly against the skin of your upper arm at the level of your heart. Blood pressure should be measured at least twice using the same arm. Certain conditions can cause a difference in blood pressure between your right and left arms. If you have a high blood pressure reading during one visit or you have normal blood pressure with other risk factors, you may be asked to: Return on a different day to have your blood pressure checked again. Monitor your blood pressure at home for 1 week or longer. If you are diagnosed with hypertension, you may have other blood or imaging tests to help your health care provider understand your overall risk for other conditions. How is this treated? This condition is treated by making healthy lifestyle changes, such as eating healthy foods, exercising more, and reducing your alcohol intake. You may be referred for counseling on a healthy diet and physical activity. Your health care provider may prescribe medicine if lifestyle changes are not enough to get your blood pressure under control and if: Your systolic blood pressure is above 130. Your diastolic blood pressure is above 80. Your personal target blood pressure may vary depending on your medical conditions, your age, and other factors. Follow these instructions at home: Eating and drinking  Eat a diet that is high in fiber and potassium, and low in sodium, added sugar, and fat. An example of this eating plan is called the DASH diet. DASH stands for Dietary Approaches to Stop Hypertension. To eat this way: Eat  plenty of fresh fruits and vegetables. Try to fill one half of your plate at each meal with fruits and vegetables. Eat whole grains, such as whole-wheat pasta, brown rice, or whole-grain bread. Fill about one  fourth of your plate with whole grains. Eat or drink low-fat dairy products, such as skim milk or low-fat yogurt. Avoid fatty cuts of meat, processed or cured meats, and poultry with skin. Fill about one fourth of your plate with lean proteins, such as fish, chicken without skin, beans, eggs, or tofu. Avoid pre-made and processed foods. These tend to be higher in sodium, added sugar, and fat. Reduce your daily sodium intake. Many people with hypertension should eat less than 1,500 mg of sodium a day. Do not drink alcohol if: Your health care provider tells you not to drink. You are pregnant, may be pregnant, or are planning to become pregnant. If you drink alcohol: Limit how much you have to: 0-1 drink a day for women. 0-2 drinks a day for men. Know how much alcohol is in your drink. In the [place] one drink equals one 12 oz bottle of beer (355 mL), one 5 oz glass of wine (148 mL), or one 1 oz glass of hard liquor (44 mL). Lifestyle  Work with your health care provider to maintain a healthy body weight or to lose weight. Ask what an ideal weight is for you. Get at least 30 minutes of exercise that causes your heart to beat faster (aerobic exercise) most days of the week. Activities may include walking, swimming, or biking. Include exercise to strengthen your muscles (resistance exercise), such as Pilates or lifting weights, as part of your weekly exercise routine. Try to do these types of exercises for 30 minutes at least 3 days a week. Do not use any products that contain nicotine or tobacco. These products include cigarettes, chewing tobacco, and vaping devices, such as e-cigarettes. If you need help quitting, ask your health care provider. Monitor your blood pressure at home as told by your health care provider. Keep all follow-up visits. This is important. Medicines Take over-the-counter and prescription medicines only as told by your health care provider. Follow directions carefully. Blood  pressure medicines must be taken as prescribed. Do not skip doses of blood pressure medicine. Doing this puts you at risk for problems and can make the medicine less effective. Ask your health care provider about side effects or reactions to medicines that you should watch for. Contact a health care provider if you: Think you are having a reaction to a medicine you are taking. Have headaches that keep coming back (recurring). Feel dizzy. Have swelling in your ankles. Have trouble with your vision. Get help right away if you: Develop a severe headache or confusion. Have unusual weakness or numbness. Feel faint. Have severe pain in your chest or abdomen. Vomit repeatedly. Have trouble breathing. These symptoms may be an emergency. Get help right away. Call 911. Do not wait to see if the symptoms will go away. Do not drive yourself to the hospital. Summary Hypertension is when the force of blood pumping through your arteries is too strong. If this condition is not controlled, it may put you at risk for serious complications. Your personal target blood pressure may vary depending on your medical conditions, your age, and other factors. For most people, a normal blood pressure is less than 120/80. Hypertension is treated with lifestyle changes, medicines, or a combination of both. Lifestyle changes include losing weight, eating a healthy,  low-sodium diet, exercising more, and limiting alcohol. This information is not intended to replace advice given to you by your health care provider. Make sure you discuss any questions you have with your health care provider. Document Revised: 12/02/2020 Document Reviewed: 12/02/2020 Elsevier Patient Education  2024 [employer].
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
## 2023-08-09 ENCOUNTER — Ambulatory Visit
Admission: RE | Admit: 2023-08-09 | Discharge: 2023-08-09 | Disposition: A | Discharge status: Home / Self Care | Source: Ambulatory Visit | Attending: Internal Medicine | Admitting: Internal Medicine

## 2023-08-09 DIAGNOSIS — Z1231 Encounter for screening mammogram for malignant neoplasm of breast: Secondary | ICD-10-CM

## 2023-09-13 ENCOUNTER — Other Ambulatory Visit: Payer: Self-pay | Admitting: Internal Medicine

## 2023-09-13 DIAGNOSIS — I700 Atherosclerosis of aorta: Secondary | ICD-10-CM

## 2023-09-15 NOTE — Telephone Encounter (Signed)
 Last OV 07/21/23 Next OV 09/20/23  Last refill 12/17/22 Qty #90/1

## 2023-09-16 ENCOUNTER — Other Ambulatory Visit: Payer: Self-pay

## 2023-09-16 DIAGNOSIS — C8318 Mantle cell lymphoma, lymph nodes of multiple sites: Secondary | ICD-10-CM

## 2023-09-19 ENCOUNTER — Inpatient Hospital Stay: Admitting: Hematology

## 2023-09-19 ENCOUNTER — Inpatient Hospital Stay: Attending: Hematology

## 2023-09-19 VITALS — BP 121/66 | HR 80 | Temp 98.2°F | Resp 18

## 2023-09-19 VITALS — BP 122/76 | HR 78 | Temp 97.2°F | Resp 18 | Wt 127.0 lb

## 2023-09-19 DIAGNOSIS — D702 Other drug-induced agranulocytosis: Secondary | ICD-10-CM

## 2023-09-19 DIAGNOSIS — C8318 Mantle cell lymphoma, lymph nodes of multiple sites: Secondary | ICD-10-CM | POA: Diagnosis not present

## 2023-09-19 DIAGNOSIS — Z5112 Encounter for antineoplastic immunotherapy: Secondary | ICD-10-CM | POA: Diagnosis not present

## 2023-09-19 DIAGNOSIS — Z7189 Other specified counseling: Secondary | ICD-10-CM

## 2023-09-19 DIAGNOSIS — Z79899 Other long term (current) drug therapy: Secondary | ICD-10-CM | POA: Insufficient documentation

## 2023-09-19 LAB — CBC WITH DIFFERENTIAL (CANCER CENTER ONLY)
Abs Immature Granulocytes: 0.17 K/uL — ABNORMAL HIGH (ref 0.00–0.07)
Basophils Absolute: 0 K/uL (ref 0.0–0.1)
Basophils Relative: 0 %
Eosinophils Absolute: 0.1 K/uL (ref 0.0–0.5)
Eosinophils Relative: 1 %
HCT: 35.1 % — ABNORMAL LOW (ref 36.0–46.0)
Hemoglobin: 11.7 g/dL — ABNORMAL LOW (ref 12.0–15.0)
Immature Granulocytes: 2 %
Lymphocytes Relative: 15 %
Lymphs Abs: 1.3 K/uL (ref 0.7–4.0)
MCH: 31.7 pg (ref 26.0–34.0)
MCHC: 33.3 g/dL (ref 30.0–36.0)
MCV: 95.1 fL (ref 80.0–100.0)
Monocytes Absolute: 1.2 K/uL — ABNORMAL HIGH (ref 0.1–1.0)
Monocytes Relative: 13 %
Neutro Abs: 6.1 K/uL (ref 1.7–7.7)
Neutrophils Relative %: 69 %
Platelet Count: 251 K/uL (ref 150–400)
RBC: 3.69 MIL/uL — ABNORMAL LOW (ref 3.87–5.11)
RDW: 13.9 % (ref 11.5–15.5)
WBC Count: 8.9 K/uL (ref 4.0–10.5)
nRBC: 0 % (ref 0.0–0.2)

## 2023-09-19 LAB — CMP (CANCER CENTER ONLY)
ALT: 22 U/L (ref 0–44)
AST: 20 U/L (ref 15–41)
Albumin: 4.3 g/dL (ref 3.5–5.0)
Alkaline Phosphatase: 56 U/L (ref 38–126)
Anion gap: 6 (ref 5–15)
BUN: 29 mg/dL — ABNORMAL HIGH (ref 8–23)
CO2: 28 mmol/L (ref 22–32)
Calcium: 9.2 mg/dL (ref 8.9–10.3)
Chloride: 105 mmol/L (ref 98–111)
Creatinine: 0.99 mg/dL (ref 0.44–1.00)
GFR, Estimated: 59 mL/min — ABNORMAL LOW (ref >60)
Glucose, Bld: 77 mg/dL (ref 70–99)
Potassium: 4.4 mmol/L (ref 3.5–5.1)
Sodium: 139 mmol/L (ref 135–145)
Total Bilirubin: 0.7 mg/dL (ref 0.0–1.2)
Total Protein: 6.3 g/dL — ABNORMAL LOW (ref 6.5–8.1)

## 2023-09-19 LAB — LACTATE DEHYDROGENASE: LDH: 192 U/L (ref 98–192)

## 2023-09-19 MED ORDER — SODIUM CHLORIDE 0.9 % IV SOLN
12.0000 mg | Freq: Once | INTRAVENOUS | Status: AC
  Administered 2023-09-19 (×2): 12 mg via INTRAVENOUS
  Filled 2023-09-19: qty 1.2

## 2023-09-19 MED ORDER — MONTELUKAST SODIUM 10 MG PO TABS
10.0000 mg | ORAL_TABLET | Freq: Once | ORAL | Status: AC
  Administered 2023-09-19 (×2): 10 mg via ORAL
  Filled 2023-09-19: qty 1

## 2023-09-19 MED ORDER — DIPHENHYDRAMINE HCL 25 MG PO CAPS
50.0000 mg | ORAL_CAPSULE | Freq: Once | ORAL | Status: AC
  Administered 2023-09-19 (×2): 50 mg via ORAL
  Filled 2023-09-19: qty 2

## 2023-09-19 MED ORDER — SODIUM CHLORIDE 0.9 % IV SOLN
375.0000 mg/m2 | Freq: Once | INTRAVENOUS | Status: AC
  Administered 2023-09-19 (×2): 600 mg via INTRAVENOUS
  Filled 2023-09-19: qty 10

## 2023-09-19 MED ORDER — ACETAMINOPHEN 325 MG PO TABS
650.0000 mg | ORAL_TABLET | Freq: Once | ORAL | Status: AC
  Administered 2023-09-19 (×2): 650 mg via ORAL
  Filled 2023-09-19: qty 2

## 2023-09-19 MED ORDER — SODIUM CHLORIDE 0.9 % IV SOLN
Freq: Once | INTRAVENOUS | Status: AC
Start: 2023-09-19 — End: 2023-09-19

## 2023-09-19 MED ORDER — FAMOTIDINE IN NACL 20-0.9 MG/50ML-% IV SOLN
20.0000 mg | Freq: Once | INTRAVENOUS | Status: AC
  Administered 2023-09-19 (×2): 20 mg via INTRAVENOUS
  Filled 2023-09-19: qty 50

## 2023-09-19 NOTE — Progress Notes (Signed)
 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 female here for continued evaluation and management of mantle cell lymphoma and her next dose of maintenance Rituxan  therapy.   Patient is here for her next dose of maintenance Rituxan .  Her last visit with us  was in May 2025.  She notes no new toxicities with her last dose of treatment.  No infection issues since her last visit. Has been staying busy taking care of her grandchild who is going to turn 93-year-old next month.  She is excited about that. We discussed that she has completed 3 years of maintenance Rituxan  at this time. She would like to continue maintenance Rituxan  every 90 days till progression or toxicity or personal preference. Notes that she has had a few dental cavities including one under a previous crown of the tooth.  There are a few others that she will also have filled.  She does drink 2 Coca-Cola drinks a  day and coffee.  We encouraged her to drink more water instead. No other acute new focal symptoms.  No fevers no chills no night sweats no unexpected weight loss.  No new lumps or bumps.  REVIEW OF SYSTEMS:    .10 Point review of Systems was done is negative except as noted above.   Past Medical History:  Diagnosis Date   Allergy    Arthritis    Back pain    Complication of anesthesia    bp dropped yrs ago, recent surgeries went ok   Dysrhythmia    occ, no meds for   Gastritis    GERD (gastroesophageal reflux disease)    H. pylori infection    Headache    migraines occ   Hiatal hernia    Hx of adenomatous colonic polyps 11/30/2017   Hyperlipidemia    Hypothyroidism 1970's   hx of years ago, no meds for    mantle cell lymphoma    Pulmonary nodule    Shortness of breath dyspnea    with exertion, pt told comes from acid reflux    . Past Surgical History:  Procedure Laterality Date   APPENDECTOMY     COLONOSCOPY  12/07/2013   COLONOSCOPY W/ POLYPECTOMY  11/14/2017   TA   DILATION AND CURETTAGE OF UTERUS     EYE SURGERY Bilateral 07/2022   Cataract surgery   LAPAROSCOPIC CHOLECYSTECTOMY SINGLE SITE WITH INTRAOPERATIVE CHOLANGIOGRAM N/A 10/28/2015   Procedure: LAPAROSCOPIC CHOLECYSTECTOMY WITH INTRAOPERATIVE CHOLANGIOGRAM;  Surgeon: Krystal Spinner, MD;  Location: [facility];  Service: General;  Laterality: N/A;   LAPAROSCOPY  yrs ago   x 2   TMJ ARTHROSCOPY     TOTAL ABDOMINAL HYSTERECTOMY     complete    . Social History   Tobacco Use   Smoking status: Never   Smokeless tobacco: Never  Vaping Use   Vaping status: Never Used  Substance Use Topics   Alcohol use: Yes    Alcohol/week: 4.0 - 5.0 standard drinks of alcohol    Types: 4 - 5 Cans of beer per week    Comment: 4-5 beers weekly-occ per pt   Drug use: No    ALLERGIES:  has no known allergies.  MEDICATIONS:  Current Outpatient Medications  Medication Sig Dispense Refill   ALPRAZolam  (XANAX ) 0.5 MG  tablet Take 0.5 mg by mouth as needed for anxiety.     ASPIRIN  LOW DOSE 81 MG tablet TAKE 1 TABLET BY MOUTH EVERY DAY 90 tablet 1   azelastine  (ASTELIN ) 0.1 % nasal spray Place 2 sprays into both nostrils 2 (two) times daily. Use in each nostril as directed 90 mL 1   Cholecalciferol (VITAMIN D3) 5000 units CAPS Take 1 capsule by mouth every other day.     cyclobenzaprine (FLEXERIL) 5 MG tablet Take 5 mg by mouth 3 (three) times daily as needed for muscle spasms.     HYDROcodone -acetaminophen  (NORCO) 10-325 MG tablet Take 1 tablet by mouth every 8 (eight) hours as needed. 90 tablet 0   montelukast  (SINGULAIR ) 10 MG tablet Take 1 tablet (10 mg total) by mouth at bedtime. 90 tablet 1   omeprazole (PRILOSEC) 20 MG capsule TAKE 1 CAPSULE BY MOUTH DAILY 30 MINUTES BEFORE MORNING MEAL 90 capsule 1   predniSONE  (DELTASONE ) 20 MG tablet TAKE 1 TABLET (20 MG TOTAL) BY MOUTH DAILY WITH BREAKFAST FOR 5 DAYS AFTER EACH RITUXAN  INFUSION 5 tablet 6   rosuvastatin  (CRESTOR ) 10 MG tablet Take 1 tablet (10 mg total) by mouth daily. 90 tablet 1   vitamin B-12 (CYANOCOBALAMIN ) 500 MCG tablet Take 500 mcg by mouth every other day.     No current facility-administered medications for this visit.    PHYSICAL EXAMINATION: .BP 122/76   Pulse 78   Temp (!) 97.2 F (36.2 C)   Resp 18   Wt 127 lb (57.6 kg)   SpO2 99%   BMI 23.23 kg/m   NAD GENERAL:alert, in no acute distress and comfortable SKIN: no acute rashes, no significant lesions EYES: conjunctiva are pink and non-injected, sclera anicteric OROPHARYNX: MMM, no exudates, no oropharyngeal erythema or ulceration NECK: supple, no JVD LYMPH:  no palpable lymphadenopathy in the cervical, axillary or inguinal regions LUNGS: clear to auscultation b/l with normal respiratory effort HEART: regular rate & rhythm ABDOMEN:  normoactive bowel sounds , non tender, not distended. Extremity: no pedal edema PSYCH: alert & oriented x 3 with fluent speech NEURO: no  focal motor/sensory deficits   LABORATORY DATA:   I have reviewed the data as listed      Latest Ref Rng & Units 09/19/2023    8:34 AM 06/21/2023    8:57 AM 06/14/2023    2:40 PM  CBC  WBC 4.0 - 10.5 K/uL 8.9  7.5  6.4   Hemoglobin 12.0 - 15.0 g/dL 88.2  87.3  88.5   Hematocrit 36.0 - 46.0 % 35.1  37.3  33.6   Platelets 150 - 400 K/uL 251  245  244.0     .    Latest Ref Rng & Units 09/19/2023  8:34 AM 06/21/2023    8:57 AM 06/14/2023    2:40 PM  CMP  Glucose 70 - 99 mg/dL 77  99  899   BUN 8 - 23 mg/dL 29  20  14    Creatinine 0.44 - 1.00 mg/dL 9.00  8.84  9.05   Sodium 135 - 145 mmol/L 139  138  141   Potassium 3.5 - 5.1 mmol/L 4.4  4.1  4.9   Chloride 98 - 111 mmol/L 105  102  104   CO2 22 - 32 mmol/L 28  27  29    Calcium  8.9 - 10.3 mg/dL 9.2  9.8  9.8   Total Protein 6.5 - 8.1 g/dL 6.3  7.0    Total Bilirubin 0.0 - 1.2 mg/dL 0.7  0.9    Alkaline Phos 38 - 126 U/L 56  71    AST 15 - 41 U/L 20  19    ALT 0 - 44 U/L 22  11     . Lab Results  Component Value Date   LDH 192 09/19/2023   RADIOGRAPHIC STUDIES: I have personally reviewed the radiological images as listed and agreed with the findings in the report. No results found.    ASSESSMENT & PLAN:   78 yo female with   1) Stage III/IV Mantle cell lymphoma 01/11/2020 PET/CT [phone]) revealed no visible disease, continued resolution. 2) s/p hypersensitivity reaction to Rapid Rituxan . - rash and low grade fever -  resolved with solumedrol/tylenol , benadryl  and famotidine    PLAN:  -discussed lab results from today, 09/19/23, in detail with patient Patient CBC and CMP are stable CMP suggests mild bump in BUN.  Will recommend to drink more water. LDH is within normal limits Patient has no new clinical or lab findings suggestive of mantle cell lymphoma recurrence/progression at this time No new infections or new toxicities from her current maintenance Rituxan  treatment. Patient is appropriate to proceed with  her next dose of maintenance Rituxan  today with the same supportive medications. -Discussed role for maintenance Rituxan  therapy.  Patient has completed 3 years of maintenance Rituxan  and would like to continue this until progression or medication toxicities or till she decides she would like to take a break from treatment. -Continue taking vitamin B12  -Continue vitamin D supplements.   FOLLOW-UP:  Continue maintenance Rituxan  every 90 days per integrated scheduling   The total time spent in the appointment was 30 minutes*.  All of the patient's questions were answered with apparent satisfaction. The patient knows to call the [name] with any problems, questions or concerns.   Emaline Saran MD MS AAHIVMS [facility] Hematology/Oncology Physician [facility]  .*Total Encounter Time as defined by the Centers for Medicare and Medicaid Services includes, in addition to the face-to-face time of a patient visit (documented in the note above) non-face-to-face time: obtaining and reviewing outside history, ordering and reviewing medications, tests or procedures, care coordination (communications with other health care professionals or caregivers) and documentation in the medical record.

## 2023-09-19 NOTE — Patient Instructions (Signed)
 [facility] - A DEPT OF [facility]  Discharge Instructions: Thank you for choosing [facility] to provide your oncology and hematology care.   If you have a lab appointment with the Cancer Center, please go directly to the Cancer Center and check in at the registration area.   Wear comfortable clothing and clothing appropriate for easy access to any Portacath or PICC line.   We strive to give you quality time with your provider. You may need to reschedule your appointment if you arrive late (15 or more minutes).  Arriving late affects you and other patients whose appointments are after yours.  Also, if you miss three or more appointments without notifying the office, you may be dismissed from the clinic at the provider's discretion.      For prescription refill requests, have your pharmacy contact our office and allow 72 hours for refills to be completed.    Today you received the following chemotherapy and/or immunotherapy agents rituximab        To help prevent nausea and vomiting after your treatment, we encourage you to take your nausea medication as directed.  BELOW ARE SYMPTOMS THAT SHOULD BE REPORTED IMMEDIATELY: *FEVER GREATER THAN 100.4 F (38 C) OR HIGHER *CHILLS OR SWEATING *NAUSEA AND VOMITING THAT IS NOT CONTROLLED WITH YOUR NAUSEA MEDICATION *UNUSUAL SHORTNESS OF BREATH *UNUSUAL BRUISING OR BLEEDING *URINARY PROBLEMS (pain or burning when urinating, or frequent urination) *BOWEL PROBLEMS (unusual diarrhea, constipation, pain near the anus) TENDERNESS IN MOUTH AND THROAT WITH OR WITHOUT PRESENCE OF ULCERS (sore throat, sores in mouth, or a toothache) UNUSUAL RASH, SWELLING OR PAIN  UNUSUAL VAGINAL DISCHARGE OR ITCHING   Items with * indicate a potential emergency and should be followed up as soon as possible or go to the Emergency Department if any problems should occur.  Please show the CHEMOTHERAPY ALERT CARD or IMMUNOTHERAPY  ALERT CARD at check-in to the Emergency Department and triage nurse.  Should you have questions after your visit or need to cancel or reschedule your appointment, please contact [facility] - A DEPT OF [facility]  Dept: [phone]  and follow the prompts.  Office hours are 8:00 a.m. to 4:30 p.m. Monday - Friday. Please note that voicemails left after 4:00 p.m. may not be returned until the following business day.  We are closed weekends and major holidays. You have access to a nurse at all times for urgent questions. Please call the main number to the clinic Dept: [phone] and follow the prompts.   For any non-urgent questions, you may also contact your provider using MyChart. We now offer e-Visits for anyone 77 and older to request care online for non-urgent symptoms. For details visit [email or web address].   Also download the MyChart app! Go to the app store, search MyChart, open the app, select [facility], and log in with your MyChart username and password.

## 2023-09-20 ENCOUNTER — Ambulatory Visit: Payer: Medicare Other

## 2023-09-20 VITALS — Ht 62.0 in | Wt 127.0 lb

## 2023-09-20 DIAGNOSIS — Z0000 Encounter for general adult medical examination without abnormal findings: Secondary | ICD-10-CM | POA: Diagnosis not present

## 2023-09-20 NOTE — Progress Notes (Signed)
 Subjective:   Brooke [name] is a 78 y.o. who presents for a Medicare Wellness preventive visit.  As a reminder, Annual Wellness Visits don't include a physical exam, and some assessments may be limited, especially if this visit is performed virtually. We may recommend an in-person follow-up visit with your provider if needed.  Visit Complete: Virtual I connected with  Brooke [name] on 09/20/23 by a audio enabled telemedicine application and verified that I am speaking with the correct person using two identifiers.  Patient Location: Home  Provider Location: Home Office  I discussed the limitations of evaluation and management by telemedicine. The patient expressed understanding and agreed to proceed.  Vital Signs: Because this visit was a virtual/telehealth visit, some criteria may be missing or patient reported. Any vitals not documented were not able to be obtained and vitals that have been documented are patient reported.  VideoDeclined- This patient declined Librarian, academic. Therefore the visit was completed with audio only.  Persons Participating in Visit: Patient.  AWV Questionnaire: No: Patient Medicare AWV questionnaire was not completed prior to this visit.  Cardiac Risk Factors include: hypertension;advanced age (>10men, >52 women);Other (see comment), Risk factor comments: Atherosclerosis of aorta     Objective:    Today's Vitals   09/20/23 0911  Weight: 127 lb (57.6 kg)  Height: 5' 2 (1.575 m)   Body mass index is 23.23 kg/m.     09/20/2023    9:27 AM 06/17/2023   12:21 AM 09/17/2022    8:22 AM 09/21/2021   10:27 AM 10/24/2020    2:21 PM 08/22/2020    2:04 PM 03/11/2020   10:12 AM  Advanced Directives  Does Patient Have a Medical Advance Directive? No No No No No No No  Would patient like information on creating a medical advance directive?  No - Patient declined No - Patient declined No - Patient declined  No - Patient declined No - Patient declined No - Patient declined    Current Medications (verified) Outpatient Encounter Medications as of 09/20/2023  Medication Sig   ALPRAZolam  (XANAX ) 0.5 MG tablet Take 0.5 mg by mouth as needed for anxiety.   ASPIRIN  LOW DOSE 81 MG tablet TAKE 1 TABLET BY MOUTH EVERY DAY   azelastine  (ASTELIN ) 0.1 % nasal spray Place 2 sprays into both nostrils 2 (two) times daily. Use in each nostril as directed   Cholecalciferol (VITAMIN D3) 5000 units CAPS Take 1 capsule by mouth every other day.   cyclobenzaprine (FLEXERIL) 5 MG tablet Take 5 mg by mouth 3 (three) times daily as needed for muscle spasms.   HYDROcodone -acetaminophen  (NORCO) 10-325 MG tablet Take 1 tablet by mouth every 8 (eight) hours as needed.   montelukast  (SINGULAIR ) 10 MG tablet Take 1 tablet (10 mg total) by mouth at bedtime.   omeprazole (PRILOSEC) 20 MG capsule TAKE 1 CAPSULE BY MOUTH DAILY 30 MINUTES BEFORE MORNING MEAL   predniSONE  (DELTASONE ) 20 MG tablet TAKE 1 TABLET (20 MG TOTAL) BY MOUTH DAILY WITH BREAKFAST FOR 5 DAYS AFTER EACH RITUXAN  INFUSION   rosuvastatin  (CRESTOR ) 10 MG tablet Take 1 tablet (10 mg total) by mouth daily.   vitamin B-12 (CYANOCOBALAMIN ) 500 MCG tablet Take 500 mcg by mouth every other day.   No facility-administered encounter medications on file as of 09/20/2023.    Allergies (verified) Patient has no known allergies.   History: Past Medical History:  Diagnosis Date   Allergy    Arthritis    Back  pain    Complication of anesthesia    bp dropped yrs ago, recent surgeries went ok   Dysrhythmia    occ, no meds for   Gastritis    GERD (gastroesophageal reflux disease)    H. pylori infection    Headache    migraines occ   Hiatal hernia    Hx of adenomatous colonic polyps 11/30/2017   Hyperlipidemia    Hypothyroidism 1970's   hx of years ago, no meds for    mantle cell lymphoma    Pulmonary nodule    Shortness of breath dyspnea    with exertion, pt  told comes from acid reflux   Past Surgical History:  Procedure Laterality Date   APPENDECTOMY     COLONOSCOPY  12/07/2013   COLONOSCOPY W/ POLYPECTOMY  11/14/2017   TA   DILATION AND CURETTAGE OF UTERUS     EYE SURGERY Bilateral 07/2022   Cataract surgery   LAPAROSCOPIC CHOLECYSTECTOMY SINGLE SITE WITH INTRAOPERATIVE CHOLANGIOGRAM N/A 10/28/2015   Procedure: LAPAROSCOPIC CHOLECYSTECTOMY WITH INTRAOPERATIVE CHOLANGIOGRAM;  Surgeon: Krystal Spinner, MD;  Location: [facility];  Service: General;  Laterality: N/A;   LAPAROSCOPY  yrs ago   x 2   TMJ ARTHROSCOPY     TOTAL ABDOMINAL HYSTERECTOMY     complete   Family History  Problem Relation Age of Onset   Heart failure Mother    Colon cancer Father        early 23's   Ovarian cancer Sister 63   Colon polyps Sister    Breast cancer Maternal Aunt    Colon cancer Paternal Grandfather        unknown age of onset   Breast cancer Cousin 28 - 59   Colon cancer Cousin    Kidney cancer Brother    Colon polyps Brother    Lung cancer Brother    Rectal cancer Neg Hx    Stomach cancer Neg Hx    Esophageal cancer Neg Hx    Social History   Socioeconomic History   Marital status: Widowed    Spouse name: Not on file   Number of children: 2   Years of education: Not on file   Highest education level: Not on file  Occupational History   Occupation: Retired  Tobacco Use   Smoking status: Never   Smokeless tobacco: Never  Vaping Use   Vaping status: Never Used  Substance and Sexual Activity   Alcohol use: Yes    Alcohol/week: 4.0 - 5.0 standard drinks of alcohol    Types: 4 - 5 Cans of beer per week    Comment: 4-5 beers weekly-occ per pt   Drug use: No   Sexual activity: Not Currently    Birth control/protection: Post-menopausal, Surgical  Other Topics Concern   Not on file  Social History Narrative   1 child deceased   1 adopted daughter   Mostly lives alone, daughter will being staying soon.  She will have her first grand baby.    Lives alone/2025   Social Drivers of Health   Financial Resource Strain: Low Risk  (09/20/2023)   Overall Financial Resource Strain (CARDIA)    Difficulty of Paying Living Expenses: Not hard at all  Food Insecurity: No Food Insecurity (09/20/2023)   Hunger Vital Sign    Worried About Running Out of Food in the Last Year: Never true    Ran Out of Food in the Last Year: Never true  Transportation Needs: No Transportation Needs (09/20/2023)  PRAPARE - Administrator, Civil Service (Medical): No    Lack of Transportation (Non-Medical): No  Physical Activity: Sufficiently Active (09/20/2023)   Exercise Vital Sign    Days of Exercise per Week: 3 days    Minutes of Exercise per Session: 50 min  Stress: No Stress Concern Present (09/20/2023)   [employer] of Occupational Health - Occupational Stress Questionnaire    Feeling of Stress: Not at all  Social Connections: Moderately Isolated (09/20/2023)   Social Connection and Isolation Panel    Frequency of Communication with Friends and Family: Once a week    Frequency of Social Gatherings with Friends and Family: Once a week    Attends Religious Services: More than 4 times per year    Active Member of [employer] or Organizations: Yes    Attends Banker Meetings: Never    Marital Status: Widowed    Tobacco Counseling Counseling given: Not Answered    Clinical Intake:  Pre-visit preparation completed: Yes  Pain : No/denies pain     BMI - recorded: 23.23 Nutritional Status: BMI of 19-24  Normal Nutritional Risks: None Diabetes: No  No results found for: HGBA1C   How often do you need to have someone help you when you read instructions, pamphlets, or other written materials from your doctor or pharmacy?: 1 - Never  Interpreter Needed?: No  Information entered by :: Demarquez Ciolek, RMA   Activities of Daily Living     09/20/2023    9:12 AM  In your present state of health, do you have any  difficulty performing the following activities:  Hearing? 0  Vision? 0  Difficulty concentrating or making decisions? 0  Walking or climbing stairs? 0  Dressing or bathing? 0  Doing errands, shopping? 0  Preparing Food and eating ? N  Using the Toilet? N  In the past six months, have you accidently leaked urine? N  Do you have problems with loss of bowel control? N  Managing your Medications? N  Managing your Finances? N  Housekeeping or managing your Housekeeping? N    Patient Care Team: Joshua Debby CROME, MD as PCP - General (Internal Medicine) Bernie Lamar PARAS, MD as PCP - Cardiology (Cardiology) Lavonia Lye, MD as Consulting Physician (Ophthalmology) Paul Barrio, OD as Referring Physician (Optometry)  I have updated your Care Teams any recent Medical Services you may have received from other providers in the past year.     Assessment:   This is a routine wellness examination for [place].  Hearing/Vision screen Hearing Screening - Comments:: Denies hearing difficulties   Vision Screening - Comments:: RX reading eyeglasses/[facility]   Goals Addressed               This Visit's Progress     Patient Stated (pt-stated)   On track     Patient states she would like to try to drink more water and continue to watch what she eats.        Depression Screen     09/20/2023    9:31 AM 09/19/2023   10:09 AM 09/19/2023   10:00 AM 06/28/2023    3:21 PM 06/14/2023    2:41 PM 09/17/2022    8:30 AM 09/21/2021   10:21 AM  PHQ 2/9 Scores  PHQ - 2 Score 0 0 1 0 2 0 1  PHQ- 9 Score 2   0 6 0     Fall Risk     09/20/2023  9:28 AM 06/28/2023    3:27 PM 06/14/2023    2:41 PM 09/17/2022    8:22 AM 09/21/2021   10:27 AM  Fall Risk   Falls in the past year? 0 0 0 0 0  Number falls in past yr: 0 0 0 0 0  Injury with Fall? 0 0 0 0 0  Risk for fall due to :  No Fall Risks No Fall Risks No Fall Risks No Fall Risks  Follow up Falls evaluation completed;Falls  prevention discussed Falls evaluation completed Falls evaluation completed Falls prevention discussed Falls evaluation completed      Data saved with a previous flowsheet row definition    MEDICARE RISK AT HOME:  Medicare Risk at Home Any stairs in or around the home?: Yes (3 stairs front kithen) If so, are there any without handrails?: Yes Home free of loose throw rugs in walkways, pet beds, electrical cords, etc?: Yes Adequate lighting in your home to reduce risk of falls?: Yes Life alert?: No Use of a cane, walker or w/c?: No Grab bars in the bathroom?: No Shower chair or bench in shower?: Yes Elevated toilet seat or a handicapped toilet?: Yes  TIMED UP AND GO:  Was the test performed?  No  Cognitive Function: Declined/Normal: No cognitive concerns noted by patient or family. Patient alert, oriented, able to answer questions appropriately and recall recent events. No signs of memory loss or confusion.        09/17/2022    8:23 AM 09/21/2021   10:29 AM  6CIT Screen  What Year? 0 points 0 points  What month? 0 points 0 points  What time? 0 points 0 points  Count back from 20 0 points 0 points  Months in reverse 0 points 0 points  Repeat phrase 0 points 0 points  Total Score 0 points 0 points    Immunizations Immunization History  Administered Date(s) Administered   Fluad Quad(high Dose 65+) 02/12/2020, 10/15/2022   Influenza, High Dose Seasonal PF 10/28/2021   Influenza-Unspecified 11/08/2013, 11/11/2014   PFIZER(Purple Top)SARS-COV-2 Vaccination 03/17/2019, 04/11/2019, 02/04/2020   PNEUMOCOCCAL CONJUGATE-20 04/28/2020   Pfizer(Comirnaty)Fall Seasonal Vaccine 12 years and older 10/28/2021   Tdap 04/28/2020   Zoster Recombinant(Shingrix ) 06/15/2021, 09/30/2021    Screening Tests Health Maintenance  Topic Date Due   COVID-19 Vaccine (5 - 2024-25 season) 10/10/2022   INFLUENZA VACCINE  09/09/2023   Medicare Annual Wellness (AWV)  09/19/2024   DTaP/Tdap/Td (2 - Td  or Tdap) 04/29/2030   Pneumococcal Vaccine: 50+ Years  Completed   DEXA SCAN  Completed   Hepatitis C Screening  Completed   Zoster Vaccines- Shingrix   Completed   Hepatitis B Vaccines  Aged Out   HPV VACCINES  Aged Out   Meningococcal B Vaccine  Aged Out   Colonoscopy  Discontinued    Health Maintenance  Health Maintenance Due  Topic Date Due   COVID-19 Vaccine (5 - 2024-25 season) 10/10/2022   INFLUENZA VACCINE  09/09/2023   Health Maintenance Items Addressed: See Nurse Notes at the end of this note  Additional Screening:  Vision Screening: Recommended annual ophthalmology exams for early detection of glaucoma and other disorders of the eye. Would you like a referral to an eye doctor? No    Dental Screening: Recommended annual dental exams for proper oral hygiene  Community Resource Referral / Chronic Care Management: CRR required this visit?  No   CCM required this visit?  No   Plan:    I have  personally reviewed and noted the following in the patient's chart:   Medical and social history Use of alcohol, tobacco or illicit drugs  Current medications and supplements including opioid prescriptions. Patient is not currently taking opioid prescriptions. Functional ability and status Nutritional status Physical activity Advanced directives List of other physicians Hospitalizations, surgeries, and ER visits in previous 12 months Vitals Screenings to include cognitive, depression, and falls Referrals and appointments  In addition, I have reviewed and discussed with patient certain preventive protocols, quality metrics, and best practice recommendations. A written personalized care plan for preventive services as well as general preventive health recommendations were provided to patient.   Khushboo Chuck L Cinch Ormond, CMA   09/20/2023   After Visit Summary: (MyChart) Due to this being a telephonic visit, the after visit summary with patients personalized plan was offered to  patient via MyChart   Notes: Nothing significant to report at this time.

## 2023-09-20 NOTE — Patient Instructions (Signed)
 [name] , Thank you for taking time out of your busy schedule to complete your Annual Wellness Visit with me. I enjoyed our conversation and look forward to speaking with you again next year. I, as well as your care team,  appreciate your ongoing commitment to your health goals. Please review the following plan we discussed and let me know if I can assist you in the future. Your Game plan/ To Do List     Follow up Visits: We will see or speak with you next year for your Next Medicare AWV with our clinical staff Have you seen your provider in the last 6 months (3 months if uncontrolled diabetes)? Yes.  Last OV on 07/21/2023.  Clinician Recommendations:  Aim for 30 minutes of exercise or brisk walking, 6-8 glasses of water, and 5 servings of fruits and vegetables each day. Keep up the good work.      This is a list of the screenings recommended for you:  Health Maintenance  Topic Date Due   COVID-19 Vaccine (5 - 2024-25 season) 10/10/2022   Flu Shot  09/09/2023   Medicare Annual Wellness Visit  09/19/2024   DTaP/Tdap/Td vaccine (2 - Td or Tdap) 04/29/2030   Pneumococcal Vaccine for age over 52  Completed   DEXA scan (bone density measurement)  Completed   Hepatitis C Screening  Completed   Zoster (Shingles) Vaccine  Completed   Hepatitis B Vaccine  Aged Out   HPV Vaccine  Aged Out   Meningitis B Vaccine  Aged Out   Colon Cancer Screening  Discontinued    Advanced directives: (Copy Requested) Please bring a copy of your health care power of attorney and living will to the office to be added to your chart at your convenience. You can mail to [facility] [address] [place], [place] [address] or email to [email or web address] Advance Care Planning is important because it:  [x]  Makes sure you receive the medical care that is consistent with your values, goals, and preferences  [x]  It provides guidance to your family and loved ones and reduces their  decisional burden about whether or not they are making the right decisions based on your wishes.  Follow the link provided in your after visit summary or read over the paperwork we have mailed to you to help you started getting your Advance Directives in place. If you need assistance in completing these, please reach out to us  so that we can help you!  See attachments for Preventive Care and Fall Prevention Tips.

## 2023-09-21 ENCOUNTER — Other Ambulatory Visit: Payer: Self-pay

## 2023-09-21 ENCOUNTER — Other Ambulatory Visit: Payer: Self-pay | Admitting: Hematology

## 2023-10-20 DIAGNOSIS — C8310 Mantle cell lymphoma, unspecified site: Secondary | ICD-10-CM | POA: Diagnosis not present

## 2023-12-01 ENCOUNTER — Ambulatory Visit: Admitting: Internal Medicine

## 2023-12-06 DIAGNOSIS — K080 Exfoliation of teeth due to systemic causes: Secondary | ICD-10-CM | POA: Diagnosis not present

## 2023-12-07 ENCOUNTER — Telehealth: Payer: Self-pay

## 2023-12-07 ENCOUNTER — Encounter: Payer: Self-pay | Admitting: Internal Medicine

## 2023-12-07 ENCOUNTER — Ambulatory Visit: Admitting: Internal Medicine

## 2023-12-07 VITALS — BP 136/84 | HR 90 | Temp 98.6°F | Resp 16 | Ht 62.0 in | Wt 123.4 lb

## 2023-12-07 DIAGNOSIS — F321 Major depressive disorder, single episode, moderate: Secondary | ICD-10-CM | POA: Diagnosis not present

## 2023-12-07 DIAGNOSIS — M51369 Other intervertebral disc degeneration, lumbar region without mention of lumbar back pain or lower extremity pain: Secondary | ICD-10-CM

## 2023-12-07 DIAGNOSIS — Z0000 Encounter for general adult medical examination without abnormal findings: Secondary | ICD-10-CM | POA: Diagnosis not present

## 2023-12-07 DIAGNOSIS — E7841 Elevated Lipoprotein(a): Secondary | ICD-10-CM

## 2023-12-07 DIAGNOSIS — E785 Hyperlipidemia, unspecified: Secondary | ICD-10-CM | POA: Diagnosis not present

## 2023-12-07 DIAGNOSIS — Z23 Encounter for immunization: Secondary | ICD-10-CM

## 2023-12-07 DIAGNOSIS — R0609 Other forms of dyspnea: Secondary | ICD-10-CM | POA: Diagnosis not present

## 2023-12-07 DIAGNOSIS — F411 Generalized anxiety disorder: Secondary | ICD-10-CM | POA: Diagnosis not present

## 2023-12-07 DIAGNOSIS — C8318 Mantle cell lymphoma, lymph nodes of multiple sites: Secondary | ICD-10-CM

## 2023-12-07 DIAGNOSIS — Z0001 Encounter for general adult medical examination with abnormal findings: Secondary | ICD-10-CM

## 2023-12-07 DIAGNOSIS — I700 Atherosclerosis of aorta: Secondary | ICD-10-CM

## 2023-12-07 HISTORY — DX: Encounter for immunization: Z23

## 2023-12-07 HISTORY — DX: Generalized anxiety disorder: F41.1

## 2023-12-07 HISTORY — DX: Major depressive disorder, single episode, moderate: F32.1

## 2023-12-07 LAB — D-DIMER, QUANTITATIVE: D-Dimer, Quant: 0.55 ug{FEU}/mL — ABNORMAL HIGH (ref <0.50)

## 2023-12-07 LAB — LIPID PANEL
Cholesterol: 145 mg/dL (ref 0–200)
HDL: 59.9 mg/dL (ref >39.00)
LDL Cholesterol: 61 mg/dL (ref 0–99)
NonHDL: 85.11
Total CHOL/HDL Ratio: 2
Triglycerides: 119 mg/dL (ref 0.0–149.0)
VLDL: 23.8 mg/dL (ref 0.0–40.0)

## 2023-12-07 LAB — TROPONIN I (HIGH SENSITIVITY): High Sens Troponin I: 6 ng/L (ref 2–17)

## 2023-12-07 LAB — TSH: TSH: 1.35 u[IU]/mL (ref 0.35–5.50)

## 2023-12-07 LAB — BRAIN NATRIURETIC PEPTIDE: Pro B Natriuretic peptide (BNP): 72 pg/mL (ref 0.0–100.0)

## 2023-12-07 MED ORDER — HYDROCODONE-ACETAMINOPHEN 10-325 MG PO TABS: 1.0000 | ORAL_TABLET | Freq: Three times a day (TID) | ORAL | 0 refills | Status: DC | PRN

## 2023-12-07 MED ORDER — COVID-19 MRNA VAC-TRIS(PFIZER) 30 MCG/0.3ML IM SUSY: 0.3000 mL | PREFILLED_SYRINGE | Freq: Once | INTRAMUSCULAR | 0 refills | Status: AC

## 2023-12-07 MED ORDER — MIRTAZAPINE 7.5 MG PO TABS: 7.5000 mg | ORAL_TABLET | Freq: Every day | ORAL | 0 refills | Status: DC

## 2023-12-07 NOTE — Telephone Encounter (Signed)
 Copied from CRM #[id number]. Topic: Clinical - Prescription Issue >> Dec 07, 2023  4:58 PM Nessti S wrote: Reason for CRM: pt called because prescription was sent to wrong pharmacy; she will like prescription sent to [facility] - [place], [place] - [address] [place] [address] Phone: [phone] Fax: [phone]

## 2023-12-07 NOTE — Progress Notes (Unsigned)
 Subjective:  Patient ID: Brooke [name], female    DOB: 1945/02/11  Age: 78 y.o. MRN: [id number]  CC: Annual Exam   HPI [name] presents for a CPX and f/up ---  Discussed the use of AI scribe software for clinical note transcription with the patient, who gave verbal consent to proceed.  History of Present Illness Brooke [name] Brooke [name] is a 78 year old female who presents with shortness of breath on exertion.  She experiences shortness of breath primarily during activities such as walking or shopping, which has worsened over the past three months. She reports that she does not get short of breath during strenuous exercise, but does experience shortness of breath with activity such as walking or shopping. No associated chest pain, cough, wheezing, fever, or chills.  She feels depressed, particularly in October, due to the anniversary of the deaths of her sister, husband, and son. Prefers to be alone and has trouble sleeping, characterized by frequent dreams and waking up during the night. No thoughts of self-harm or harm to others. Not currently taking any antidepressants.  She experiences muscle cramps and twitches, particularly in her legs and feet at night, and occasionally in her fingers when typing. Describes her fingers as sometimes 'catching' and needing to be adjusted manually. She reports that she does not take medication specifically for arthritis pain, but does use hydrocodone  for pain management.  She feels anxious or nervous, particularly when visiting the doctor's office, attributing it to agitation or nervousness rather than high blood pressure symptoms. Her blood pressure at home was 121/81 mmHg this morning.  She is up to date on her vaccines except for the new COVID vaccine. Continues to receive preventive infusions at the cancer center every three months.     Outpatient Medications Prior to Visit  Medication Sig Dispense  Refill   ALPRAZolam  (XANAX ) 0.5 MG tablet Take 0.5 mg by mouth as needed for anxiety.     ASPIRIN  LOW DOSE 81 MG tablet TAKE 1 TABLET BY MOUTH EVERY DAY 90 tablet 1   azelastine  (ASTELIN ) 0.1 % nasal spray Place 2 sprays into both nostrils 2 (two) times daily. Use in each nostril as directed 90 mL 1   Cholecalciferol (VITAMIN D3) 5000 units CAPS Take 1 capsule by mouth every other day.     cyclobenzaprine (FLEXERIL) 5 MG tablet Take 5 mg by mouth 3 (three) times daily as needed for muscle spasms.     omeprazole (PRILOSEC) 20 MG capsule TAKE 1 CAPSULE BY MOUTH DAILY 30 MINUTES BEFORE MORNING MEAL 90 capsule 1   predniSONE  (DELTASONE ) 20 MG tablet TAKE 1 TABLET (20 MG TOTAL) BY MOUTH DAILY WITH BREAKFAST FOR 5 DAYS AFTER EACH RITUXAN  INFUSION 5 tablet 6   vitamin B-12 (CYANOCOBALAMIN ) 500 MCG tablet Take 500 mcg by mouth every other day.     HYDROcodone -acetaminophen  (NORCO) 10-325 MG tablet Take 1 tablet by mouth every 8 (eight) hours as needed. 90 tablet 0   rosuvastatin  (CRESTOR ) 10 MG tablet Take 1 tablet (10 mg total) by mouth daily. 90 tablet 1   montelukast  (SINGULAIR ) 10 MG tablet Take 1 tablet (10 mg total) by mouth at bedtime. 90 tablet 1   No facility-administered medications prior to visit.    ROS Review of Systems  Constitutional:  Negative for appetite change, chills, diaphoresis, fatigue and fever.  HENT: Negative.    Eyes: Negative.   Respiratory:  Positive for shortness of breath. Negative for cough, chest tightness and wheezing.  Cardiovascular:  Negative for chest pain, palpitations and leg swelling.  Gastrointestinal: Negative.  Negative for abdominal pain, constipation, diarrhea, nausea and vomiting.  Endocrine: Negative.   Genitourinary: Negative.  Negative for difficulty urinating.  Musculoskeletal:  Positive for arthralgias, back pain and myalgias. Negative for gait problem and joint swelling.  Skin: Negative.   Neurological:  Negative for dizziness and weakness.   Hematological:  Negative for adenopathy. Does not bruise/bleed easily.  Psychiatric/Behavioral:  Positive for dysphoric mood. Negative for behavioral problems, confusion, decreased concentration, self-injury, sleep disturbance and suicidal ideas. The patient is nervous/anxious.     Objective:  BP 136/84 (BP Location: Left Arm, Patient Position: Sitting, Cuff Size: Normal)   Pulse 90   Temp 98.6 F (37 C) (Oral)   Resp 16   Ht 5' 2 (1.575 m)   Wt 123 lb 6.4 oz (56 kg)   SpO2 94%   BMI 22.57 kg/m   BP Readings from Last 3 Encounters:  12/07/23 136/84  09/19/23 121/66  09/19/23 122/76    Wt Readings from Last 3 Encounters:  12/07/23 123 lb 6.4 oz (56 kg)  09/20/23 127 lb (57.6 kg)  09/19/23 127 lb (57.6 kg)    Physical Exam Vitals reviewed.  Constitutional:      Appearance: Normal appearance.  HENT:     Nose: Nose normal.     Mouth/Throat:     Mouth: Mucous membranes are moist.  Eyes:     General: No scleral icterus.    Conjunctiva/sclera: Conjunctivae normal.  Cardiovascular:     Rate and Rhythm: Normal rate and regular rhythm. Occasional Extrasystoles are present.    Pulses: Normal pulses.     Heart sounds: No murmur heard.    No friction rub. No gallop.     Comments: EKG-- SR with PAC's (new), 91 bpm No LVH, Q waves, or ST/T wave changes  Pulmonary:     Effort: Pulmonary effort is normal.     Breath sounds: No stridor. No wheezing, rhonchi or rales.  Chest:     Chest wall: No tenderness.  Abdominal:     General: Abdomen is flat.     Palpations: There is no mass.     Tenderness: There is no abdominal tenderness. There is no guarding.     Hernia: No hernia is present.  Musculoskeletal:     Cervical back: Neck supple.     Right lower leg: No edema.     Left lower leg: No edema.  Skin:    General: Skin is warm and dry.  Neurological:     General: No focal deficit present.     Mental Status: She is alert. Mental status is at baseline.  Psychiatric:         Attention and Perception: Attention and perception normal.        Mood and Affect: Mood is anxious and depressed. Affect is flat and tearful.        Speech: Speech normal. Speech is not delayed or tangential.        Behavior: Behavior normal.        Thought Content: Thought content normal. Thought content is not paranoid or delusional. Thought content does not include homicidal or suicidal ideation.        Cognition and Memory: Cognition normal.     Lab Results  Component Value Date   WBC 8.9 09/19/2023   HGB 11.7 (L) 09/19/2023   HCT 35.1 (L) 09/19/2023   PLT 251 09/19/2023   GLUCOSE 77  09/19/2023   CHOL 145 12/07/2023   TRIG 119.0 12/07/2023   HDL 59.90 12/07/2023   LDLCALC 61 12/07/2023   ALT 22 09/19/2023   AST 20 09/19/2023   NA 139 09/19/2023   K 4.4 09/19/2023   CL 105 09/19/2023   CREATININE 0.99 09/19/2023   BUN 29 (H) 09/19/2023   CO2 28 09/19/2023   TSH 1.35 12/07/2023   INR 0.9 02/19/2020    MM 3D SCREENING MAMMOGRAM BILATERAL BREAST Result Date: 08/11/2023 CLINICAL DATA:  Screening. EXAM: DIGITAL SCREENING BILATERAL MAMMOGRAM WITH TOMOSYNTHESIS AND CAD TECHNIQUE: Bilateral screening digital craniocaudal and mediolateral oblique mammograms were obtained. Bilateral screening digital breast tomosynthesis was performed. The images were evaluated with computer-aided detection. COMPARISON:  Previous exam(s). ACR Breast Density Category c: The breasts are heterogeneously dense, which may obscure small masses. FINDINGS: There are no findings suspicious for malignancy. IMPRESSION: No mammographic evidence of malignancy. A result letter of this screening mammogram will be mailed directly to the patient. RECOMMENDATION: Screening mammogram in one year. (Code:SM-B-01Y) BI-RADS CATEGORY  1: Negative. Electronically Signed   By: Inocente Ast M.D.   On: 08/11/2023 12:05    Assessment & Plan:  Need for immunization against influenza -     Flu vaccine HIGH DOSE PF(Fluzone  Trivalent)  DOE (dyspnea on exertion)- Will evaluate with a CA CT scan. -     Troponin I (High Sensitivity); Future -     D-dimer, quantitative; Future -     Brain natriuretic peptide; Future -     CT CORONARY MORPH W/CTA COR W/SCORE W/CA W/CM &/OR WO/CM; Future  GAD (generalized anxiety disorder)- Will start remeron. -     TSH; Future  Current moderate episode of major depressive disorder without prior episode (HCC)- Will start remeron. -     TSH; Future  Hyperlipidemia with target LDL less than 130 -     Lipid panel; Future -     CK; Future -     Rosuvastatin  Calcium ; Take 1 tablet (10 mg total) by mouth daily.  Dispense: 90 tablet; Refill: 0  DDD (degenerative disc disease), lumbar  Encounter for general adult medical examination with abnormal findings- Exam completed, labs reviewed, vaccines reviewed and updated, no cancer screenings indicated, pt ed material was given.   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  Dispense: 0.3 mL; Refill: 0  Dyslipidemia, goal LDL below 100 -     Rosuvastatin  Calcium ; Take 1 tablet (10 mg total) by mouth daily.  Dispense: 90 tablet; Refill: 0  Atherosclerosis of aorta -     Rosuvastatin  Calcium ; Take 1 tablet (10 mg total) by mouth daily.  Dispense: 90 tablet; Refill: 0  Elevated Lp(a) -     Rosuvastatin  Calcium ; Take 1 tablet (10 mg total) by mouth daily.  Dispense: 90 tablet; Refill: 0     Follow-up: Return in about 3 months (around 03/08/2024).  Debby Molt, MD

## 2023-12-07 NOTE — Patient Instructions (Signed)
Premature Atrial Contraction  A premature atrial contraction [facility]) is a kind of irregular heartbeat (arrhythmia). The heart has four chambers, including the upper chambers (atria) and lower chambers (ventricles). Normally, an electrical signal starts in a group of cells called the sinoatrial node (SA node) and travels through the atria, causing them to pump blood into the ventricles. During a PAC, the atria beat too early, before they have had time to fill with blood. The heartbeat pauses afterward so the heart can fill with blood for the next beat. Sometimes PAC can be a warning sign of another type of arrhythmia called atrial fibrillation. Atrial fibrillation may allow blood to pool in the atria and form clots. If a clot travels to the brain, it can cause a stroke. What are the causes? The cause of this condition is often unknown. Sometimes, this condition may be caused by heart disease or injury to the heart. What increases the risk? You are more likely to develop this condition if: You have heart disease. You are 40 years of age or older. Episodes may be triggered by: Tobacco, alcohol, or caffeine use. Stimulant drugs. Some medicines or supplements. Stress and anxiety. What are the signs or symptoms? Symptoms of this condition include: The feeling of a pause in the heartbeat. The first heartbeat after the "skipped" beat may feel more forceful. A feeling that your heart is fluttering. A quick feeling of dizziness or faintness. How is this diagnosed? This condition is diagnosed based on: Your medical history or symptoms. A physical exam. Your health care provider may listen to your heart. An ECG (electrocardiogram) to monitor the electrical activity of your heart. An ambulatory cardiac monitor that records your heartbeats for 24 hours or more. You may also have: Blood tests. An echocardiogram, which creates an image of your heart. How is this treated? Treatment depends on how often  your symptoms happen and other risk factors. Treatments may include: Medicines. A catheter ablation procedure to destroy the part of the heart tissue that sends abnormal signals. In many cases, treatment may not be needed. Follow these instructions at home: Lifestyle  Do not use any products that contain nicotine or tobacco. These products include cigarettes, chewing tobacco, and vaping devices, such as e-cigarettes. If you need help quitting, ask your health care provider. Exercise regularly. Ask your health care provider what type of exercise is safe for you. Try to get at least 7-9 hours of sleep each night. Find healthy ways to manage stress. Avoid stressful situations when possible. Alcohol use Do not drink alcohol if: Your health care provider tells you not to drink. You are pregnant, may be pregnant, or are planning to become pregnant. Alcohol triggers your episodes. If you drink alcohol: Limit how much you have to: 0-1 drink a day for women. 0-2 drinks a day for men. Know how much alcohol is in your drink. In the [place] one drink equals one 12 oz bottle of beer (355 mL), one 5 oz glass of wine (148 mL), or one 1 oz glass of hard liquor (44 mL). General instructions Take over-the-counter and prescription medicines only as told by your health care provider. If caffeine triggers episodes, do not eat, drink, or use anything with caffeine in it. Contact a health care provider if: You feel your heart is fluttering. Your heart skips beats and you feel dizzy, light-headed, or very tired. Get help right away if: You have chest pain. You have trouble breathing. You faint. You have any symptoms of  a stroke. "BE FAST" is an easy way to remember the main warning signs of a stroke: B - Balance. Signs are dizziness, sudden trouble walking, or loss of balance. E - Eyes. Signs are trouble seeing or a sudden change in vision. F - Face. Signs are sudden weakness or numbness of the face, or the  face or eyelid drooping on one side. A - Arms. Signs are weakness or numbness in an arm. This happens suddenly and usually on one side of the body. S - Speech. Signs are sudden trouble speaking, slurred speech, or trouble understanding what people say. T - Time. Time to call emergency services. Write down what time symptoms started. You have other signs of stroke, such as: A sudden, severe headache with no known cause. Nausea or vomiting. Seizure. These symptoms may be an emergency. Get help right away. Call 911. Do not wait to see if the symptoms will go away. Do not drive yourself to the hospital. This information is not intended to replace advice given to you by your health care provider. Make sure you discuss any questions you have with your health care provider. Document Revised: 06/26/2021 Document Reviewed: 06/26/2021 Elsevier Patient Education  2024 [employer].

## 2023-12-08 ENCOUNTER — Ambulatory Visit: Payer: Self-pay | Admitting: Internal Medicine

## 2023-12-08 ENCOUNTER — Other Ambulatory Visit: Payer: Self-pay

## 2023-12-08 ENCOUNTER — Encounter: Payer: Self-pay | Admitting: Hematology

## 2023-12-08 ENCOUNTER — Telehealth: Payer: Self-pay

## 2023-12-08 ENCOUNTER — Other Ambulatory Visit (HOSPITAL_COMMUNITY): Payer: Self-pay

## 2023-12-08 DIAGNOSIS — F411 Generalized anxiety disorder: Secondary | ICD-10-CM

## 2023-12-08 DIAGNOSIS — M51369 Other intervertebral disc degeneration, lumbar region without mention of lumbar back pain or lower extremity pain: Secondary | ICD-10-CM

## 2023-12-08 DIAGNOSIS — F321 Major depressive disorder, single episode, moderate: Secondary | ICD-10-CM

## 2023-12-08 MED ORDER — ROSUVASTATIN CALCIUM 10 MG PO TABS
10.0000 mg | ORAL_TABLET | Freq: Every day | ORAL | 0 refills | Status: DC
Start: 2023-12-08 — End: 2023-12-30

## 2023-12-08 MED ORDER — MIRTAZAPINE 7.5 MG PO TABS: 7.5000 mg | ORAL_TABLET | Freq: Every day | ORAL | 0 refills | Status: AC

## 2023-12-08 MED ORDER — HYDROCODONE-ACETAMINOPHEN 10-325 MG PO TABS: 1.0000 | ORAL_TABLET | Freq: Three times a day (TID) | ORAL | 0 refills | Status: AC | PRN

## 2023-12-08 NOTE — Telephone Encounter (Signed)
 Pharmacy Patient Advocate Encounter   Received notification from CoverMyMeds that prior authorization for HYDROcodone -Acetaminophen  10-325MG  tablets  is required/requested.   Insurance verification completed.   The patient is insured through [facility].   Per test claim: PA required; PA submitted to above mentioned insurance via Latent Key/confirmation #/EOC [id number] Status is pending

## 2023-12-08 NOTE — Telephone Encounter (Signed)
 Medication refill has been sent to Dr. Joshua to resend to the correct pharmacy.

## 2023-12-09 ENCOUNTER — Encounter: Payer: Self-pay | Admitting: Hematology

## 2023-12-09 ENCOUNTER — Other Ambulatory Visit (HOSPITAL_COMMUNITY): Payer: Self-pay

## 2023-12-09 NOTE — Telephone Encounter (Signed)
 Pharmacy Patient Advocate Encounter   Received notification from CoverMyMeds that prior authorization for HYDROcodone -Acetaminophen  10-325MG  tablets is required/requested.   Insurance verification completed.   The patient is insured through [facility].   Per test claim: PA is not needed at this time. Medication was filled 12/09/2023

## 2023-12-09 NOTE — Addendum Note (Signed)
 Addended by: Vishruth Seoane , Shigeko Manard M on: 12/09/2023 03:01 PM   Modules accepted: Orders

## 2023-12-13 DIAGNOSIS — D225 Melanocytic nevi of trunk: Secondary | ICD-10-CM | POA: Diagnosis not present

## 2023-12-13 DIAGNOSIS — L603 Nail dystrophy: Secondary | ICD-10-CM | POA: Diagnosis not present

## 2023-12-13 DIAGNOSIS — L821 Other seborrheic keratosis: Secondary | ICD-10-CM | POA: Diagnosis not present

## 2023-12-13 DIAGNOSIS — L814 Other melanin hyperpigmentation: Secondary | ICD-10-CM | POA: Diagnosis not present

## 2023-12-14 ENCOUNTER — Telehealth (HOSPITAL_COMMUNITY): Payer: Self-pay | Admitting: *Deleted

## 2023-12-14 ENCOUNTER — Encounter (HOSPITAL_COMMUNITY): Payer: Self-pay

## 2023-12-14 MED ORDER — METOPROLOL TARTRATE 100 MG PO TABS: ORAL_TABLET | ORAL | 0 refills | Status: AC

## 2023-12-14 NOTE — Telephone Encounter (Signed)

## 2023-12-15 ENCOUNTER — Ambulatory Visit (HOSPITAL_COMMUNITY)
Admission: RE | Admit: 2023-12-15 | Discharge: 2023-12-15 | Disposition: A | Discharge status: Home / Self Care | Source: Ambulatory Visit | Attending: Cardiovascular Disease | Admitting: Cardiovascular Disease

## 2023-12-15 ENCOUNTER — Other Ambulatory Visit: Payer: Self-pay | Admitting: Cardiology

## 2023-12-15 ENCOUNTER — Ambulatory Visit (HOSPITAL_BASED_OUTPATIENT_CLINIC_OR_DEPARTMENT_OTHER)
Admission: RE | Admit: 2023-12-15 | Discharge: 2023-12-15 | Disposition: A | Discharge status: Home / Self Care | Source: Ambulatory Visit | Attending: Cardiology | Admitting: Cardiology

## 2023-12-15 ENCOUNTER — Other Ambulatory Visit: Payer: Self-pay | Admitting: Internal Medicine

## 2023-12-15 ENCOUNTER — Other Ambulatory Visit: Payer: Self-pay

## 2023-12-15 DIAGNOSIS — I700 Atherosclerosis of aorta: Secondary | ICD-10-CM | POA: Insufficient documentation

## 2023-12-15 DIAGNOSIS — I2510 Atherosclerotic heart disease of native coronary artery without angina pectoris: Secondary | ICD-10-CM | POA: Diagnosis not present

## 2023-12-15 DIAGNOSIS — R0609 Other forms of dyspnea: Secondary | ICD-10-CM | POA: Diagnosis present

## 2023-12-15 DIAGNOSIS — R0789 Other chest pain: Secondary | ICD-10-CM | POA: Diagnosis not present

## 2023-12-15 DIAGNOSIS — C8318 Mantle cell lymphoma, lymph nodes of multiple sites: Secondary | ICD-10-CM

## 2023-12-15 DIAGNOSIS — I3481 Nonrheumatic mitral (valve) annulus calcification: Secondary | ICD-10-CM | POA: Diagnosis not present

## 2023-12-15 DIAGNOSIS — R931 Abnormal findings on diagnostic imaging of heart and coronary circulation: Secondary | ICD-10-CM | POA: Insufficient documentation

## 2023-12-15 DIAGNOSIS — I25118 Atherosclerotic heart disease of native coronary artery with other forms of angina pectoris: Secondary | ICD-10-CM

## 2023-12-15 HISTORY — DX: Atherosclerotic heart disease of native coronary artery with other forms of angina pectoris: I25.118

## 2023-12-15 MED ORDER — NITROGLYCERIN 0.4 MG SL SUBL
0.8000 mg | SUBLINGUAL_TABLET | Freq: Once | SUBLINGUAL | Status: AC
  Administered 2023-12-15: 0.8 mg via SUBLINGUAL

## 2023-12-15 MED ORDER — IOHEXOL 350 MG/ML SOLN
100.0000 mL | Freq: Once | INTRAVENOUS | Status: AC | PRN
  Administered 2023-12-15: 100 mL via INTRAVENOUS

## 2023-12-15 MED ORDER — ISOSORBIDE MONONITRATE ER 30 MG PO TB24: 30.0000 mg | ORAL_TABLET | Freq: Every day | ORAL | 0 refills | Status: AC

## 2023-12-19 ENCOUNTER — Inpatient Hospital Stay: Attending: Hematology

## 2023-12-19 ENCOUNTER — Inpatient Hospital Stay: Admitting: Hematology

## 2023-12-19 ENCOUNTER — Inpatient Hospital Stay

## 2023-12-19 VITALS — BP 120/73 | HR 87 | Temp 97.2°F | Resp 20 | Wt 126.3 lb

## 2023-12-19 VITALS — BP 130/79 | HR 78 | Temp 97.6°F | Resp 16

## 2023-12-19 DIAGNOSIS — Z7189 Other specified counseling: Secondary | ICD-10-CM

## 2023-12-19 DIAGNOSIS — Z79899 Other long term (current) drug therapy: Secondary | ICD-10-CM | POA: Insufficient documentation

## 2023-12-19 DIAGNOSIS — C8318 Mantle cell lymphoma, lymph nodes of multiple sites: Secondary | ICD-10-CM

## 2023-12-19 DIAGNOSIS — Z5112 Encounter for antineoplastic immunotherapy: Secondary | ICD-10-CM | POA: Diagnosis not present

## 2023-12-19 DIAGNOSIS — D702 Other drug-induced agranulocytosis: Secondary | ICD-10-CM

## 2023-12-19 LAB — CBC WITH DIFFERENTIAL (CANCER CENTER ONLY)
Abs Immature Granulocytes: 0.04 K/uL (ref 0.00–0.07)
Basophils Absolute: 0.1 K/uL (ref 0.0–0.1)
Basophils Relative: 1 %
Eosinophils Absolute: 0.1 K/uL (ref 0.0–0.5)
Eosinophils Relative: 1 %
HCT: 32.8 % — ABNORMAL LOW (ref 36.0–46.0)
Hemoglobin: 10.9 g/dL — ABNORMAL LOW (ref 12.0–15.0)
Immature Granulocytes: 1 %
Lymphocytes Relative: 13 %
Lymphs Abs: 1 K/uL (ref 0.7–4.0)
MCH: 31.6 pg (ref 26.0–34.0)
MCHC: 33.2 g/dL (ref 30.0–36.0)
MCV: 95.1 fL (ref 80.0–100.0)
Monocytes Absolute: 0.9 K/uL (ref 0.1–1.0)
Monocytes Relative: 12 %
Neutro Abs: 6 K/uL (ref 1.7–7.7)
Neutrophils Relative %: 72 %
Platelet Count: 282 K/uL (ref 150–400)
RBC: 3.45 MIL/uL — ABNORMAL LOW (ref 3.87–5.11)
RDW: 13.2 % (ref 11.5–15.5)
WBC Count: 8.1 K/uL (ref 4.0–10.5)
nRBC: 0 % (ref 0.0–0.2)

## 2023-12-19 LAB — CMP (CANCER CENTER ONLY)
ALT: 15 U/L (ref 0–44)
AST: 20 U/L (ref 15–41)
Albumin: 4.2 g/dL (ref 3.5–5.0)
Alkaline Phosphatase: 72 U/L (ref 38–126)
Anion gap: 8 (ref 5–15)
BUN: 24 mg/dL — ABNORMAL HIGH (ref 8–23)
CO2: 28 mmol/L (ref 22–32)
Calcium: 9.5 mg/dL (ref 8.9–10.3)
Chloride: 105 mmol/L (ref 98–111)
Creatinine: 1.19 mg/dL — ABNORMAL HIGH (ref 0.44–1.00)
GFR, Estimated: 47 mL/min — ABNORMAL LOW (ref >60)
Glucose, Bld: 104 mg/dL — ABNORMAL HIGH (ref 70–99)
Potassium: 3.9 mmol/L (ref 3.5–5.1)
Sodium: 141 mmol/L (ref 135–145)
Total Bilirubin: 0.7 mg/dL (ref 0.0–1.2)
Total Protein: 6.6 g/dL (ref 6.5–8.1)

## 2023-12-19 LAB — LACTATE DEHYDROGENASE: LDH: 202 U/L — ABNORMAL HIGH (ref 98–192)

## 2023-12-19 MED ORDER — DIPHENHYDRAMINE HCL 25 MG PO CAPS
50.0000 mg | ORAL_CAPSULE | Freq: Once | ORAL | Status: AC
  Administered 2023-12-19: 50 mg via ORAL
  Filled 2023-12-19: qty 2

## 2023-12-19 MED ORDER — MONTELUKAST SODIUM 10 MG PO TABS
10.0000 mg | ORAL_TABLET | Freq: Once | ORAL | Status: AC
  Administered 2023-12-19: 10 mg via ORAL
  Filled 2023-12-19: qty 1

## 2023-12-19 MED ORDER — SODIUM CHLORIDE 0.9 % IV SOLN
12.0000 mg | Freq: Once | INTRAVENOUS | Status: AC
  Administered 2023-12-19: 12 mg via INTRAVENOUS
  Filled 2023-12-19: qty 1.2

## 2023-12-19 MED ORDER — SODIUM CHLORIDE 0.9 % IV SOLN: Freq: Once | INTRAVENOUS | Status: AC

## 2023-12-19 MED ORDER — FAMOTIDINE IN NACL 20-0.9 MG/50ML-% IV SOLN
20.0000 mg | Freq: Once | INTRAVENOUS | Status: AC
  Administered 2023-12-19: 20 mg via INTRAVENOUS
  Filled 2023-12-19: qty 50

## 2023-12-19 MED ORDER — ACETAMINOPHEN 325 MG PO TABS
650.0000 mg | ORAL_TABLET | Freq: Once | ORAL | Status: AC
  Administered 2023-12-19: 650 mg via ORAL
  Filled 2023-12-19: qty 2

## 2023-12-19 MED ORDER — SODIUM CHLORIDE 0.9 % IV SOLN
375.0000 mg/m2 | Freq: Once | INTRAVENOUS | Status: AC
  Administered 2023-12-19: 600 mg via INTRAVENOUS
  Filled 2023-12-19: qty 50

## 2023-12-19 NOTE — Patient Instructions (Signed)
 [facility] - A DEPT OF [facility]  Discharge Instructions: Thank you for choosing [facility] to provide your oncology and hematology care.   If you have a lab appointment with the Cancer Center, please go directly to the Cancer Center and check in at the registration area.   Wear comfortable clothing and clothing appropriate for easy access to any Portacath or PICC line.   We strive to give you quality time with your provider. You may need to reschedule your appointment if you arrive late (15 or more minutes).  Arriving late affects you and other patients whose appointments are after yours.  Also, if you miss three or more appointments without notifying the office, you may be dismissed from the clinic at the provider's discretion.      For prescription refill requests, have your pharmacy contact our office and allow 72 hours for refills to be completed.    Today you received the following chemotherapy and/or immunotherapy agents: Rituximab-pvvr (Ruxience)    To help prevent nausea and vomiting after your treatment, we encourage you to take your nausea medication as directed.  BELOW ARE SYMPTOMS THAT SHOULD BE REPORTED IMMEDIATELY: *FEVER GREATER THAN 100.4 F (38 C) OR HIGHER *CHILLS OR SWEATING *NAUSEA AND VOMITING THAT IS NOT CONTROLLED WITH YOUR NAUSEA MEDICATION *UNUSUAL SHORTNESS OF BREATH *UNUSUAL BRUISING OR BLEEDING *URINARY PROBLEMS (pain or burning when urinating, or frequent urination) *BOWEL PROBLEMS (unusual diarrhea, constipation, pain near the anus) TENDERNESS IN MOUTH AND THROAT WITH OR WITHOUT PRESENCE OF ULCERS (sore throat, sores in mouth, or a toothache) UNUSUAL RASH, SWELLING OR PAIN  UNUSUAL VAGINAL DISCHARGE OR ITCHING   Items with * indicate a potential emergency and should be followed up as soon as possible or go to the Emergency Department if any problems should occur.  Please show the CHEMOTHERAPY ALERT CARD or  IMMUNOTHERAPY ALERT CARD at check-in to the Emergency Department and triage nurse.  Should you have questions after your visit or need to cancel or reschedule your appointment, please contact [facility] - A DEPT OF [facility]  Dept: [phone]  and follow the prompts.  Office hours are 8:00 a.m. to 4:30 p.m. Monday - Friday. Please note that voicemails left after 4:00 p.m. may not be returned until the following business day.  We are closed weekends and major holidays. You have access to a nurse at all times for urgent questions. Please call the main number to the clinic Dept: [phone] and follow the prompts.   For any non-urgent questions, you may also contact your provider using MyChart. We now offer e-Visits for anyone 80 and older to request care online for non-urgent symptoms. For details visit [email or web address].   Also download the MyChart app! Go to the app store, search "MyChart", open the app, select [place], and log in with your MyChart username and password.

## 2023-12-19 NOTE — Progress Notes (Signed)
 HEMATOLOGY ONCOLOGY PROGRESS NOTE  Date of service: 12/19/2023  Patient Care Team: Joshua Debby CROME, MD as PCP - General (Internal Medicine) Bernie Lamar PARAS, MD as PCP - Cardiology (Cardiology) Lavonia Lye, MD as Consulting Physician (Ophthalmology) Paul Barrio, OD as Referring Physician (Optometry)  CHIEF COMPLAINT/PURPOSE OF CONSULTATION: Follow-up for continued evaluation and management of  mantle cell lymphoma  HISTORY OF PRESENTING ILLNESS: (09/20/2019) Brooke [name] is a wonderful 78 y.o. female who has been referred to us  by Morgan Agent, MD for evaluation and management of Non-Hodgkin's Lymphoma. Pt is accompanied today by her sister. The pt reports that she is doing well overall.    The pt reports she is good. At one time she had a not under her armpit in the left side. They kept doing US  for about a year and finally did a biopsy. The not to her disappeared and she did not feel it anymore. Pt had a US  of the breast and tried to put it off due to having her COVID19 vaccine but they still had her coming in.    Over the last year pt has not had fever, chills, and  unexpected weight change. She has had night sweats but not drenching for several years now. Pt has lost weight due to stress over the last 2 months.    Of note prior to the patient's visit today, pt has had Surgical Pathology completed on 09/05/19 with results revealing Lymph node, needle/core biopsy, left axilla CD5-POSITIVE NON-HODGKIN B-CELL LYMPHOMA, SEE COMMENT   On review of systems, pt denies fevers, chills, unexpected weight changes, night sweats, new lumps/bumps, back pain, abdominal pain, skin rashes and any other symptoms.    On Social Hx the pt reports never smoked, social alcohol use   On Family Hx the pt reports many people in her family have had cancer, her second cousin had breast cancer in her early 45's.   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 female who is here today for continued evaluation and management of  mantle cell lymphoma.   she was last seen by me on 09/19/2023; at the time she mentioned the possibility of having developed a cavity.   Today, she says that she has been well and does not have any concerns today. Denies any new infection issues, fevers/chills, drenching night sweats, new lumps/bumps, or bowel changes. Denies toxicity reactions to Rituxan . On review of her labs, she notes that she does take Prilosec as needed for GERD.   She says that she does eat well, but does not have much of an appetite.   Denies taking any herbal supplements.  Reports that she is UTD on age-recommended vaccinations except for Covid-19.  REVIEW OF SYSTEMS:   10 Point review of systems of done and is negative except as noted above.  MEDICAL HISTORY Past Medical History:  Diagnosis Date  Allergy    Arthritis    Back pain    Complication of anesthesia    bp dropped yrs ago, recent surgeries went ok   Dysrhythmia    occ, no meds for   Gastritis    GERD (gastroesophageal reflux disease)    H. pylori infection    Headache    migraines occ   Hiatal hernia    Hx of adenomatous colonic polyps 11/30/2017   Hyperlipidemia    Hypothyroidism 1970's   hx of years ago, no meds for    mantle cell lymphoma    Pulmonary  nodule    Shortness of breath dyspnea    with exertion, pt told comes from acid reflux    SURGICAL HISTORY Past Surgical History:  Procedure Laterality Date   APPENDECTOMY     COLONOSCOPY  12/07/2013   COLONOSCOPY W/ POLYPECTOMY  11/14/2017   TA   DILATION AND CURETTAGE OF UTERUS     EYE SURGERY Bilateral 07/2022   Cataract surgery   LAPAROSCOPIC CHOLECYSTECTOMY SINGLE SITE WITH INTRAOPERATIVE CHOLANGIOGRAM N/A 10/28/2015   Procedure: LAPAROSCOPIC CHOLECYSTECTOMY WITH INTRAOPERATIVE CHOLANGIOGRAM;  Surgeon: Krystal Spinner, MD;  Location: [facility];  Service: General;  Laterality: N/A;   LAPAROSCOPY  yrs ago   x 2   TMJ ARTHROSCOPY     TOTAL ABDOMINAL HYSTERECTOMY     complete    SOCIAL HISTORY Social History   Tobacco Use   Smoking status: Never   Smokeless tobacco: Never  Vaping Use   Vaping status: Never Used  Substance Use Topics   Alcohol use: Yes    Alcohol/week: 4.0 - 5.0 standard drinks of alcohol    Types: 4 - 5 Cans of beer per week    Comment: 4-5 beers weekly-occ per pt   Drug use: No    Social History   Social History Narrative   1 child deceased   1 adopted daughter   Mostly lives alone, daughter will being staying soon.  She will have her first grand baby.   Lives alone/2025    SOCIAL DRIVERS OF HEALTH SDOH Screenings   Food Insecurity: No Food Insecurity (09/20/2023)  Housing: Unknown (09/20/2023)  Transportation Needs: No Transportation Needs (09/20/2023)  Utilities: Not At Risk (09/20/2023)  Alcohol Screen: Low Risk  (09/20/2023)  Depression (PHQ2-9): High Risk (12/07/2023)  Financial Resource Strain: Low Risk  (09/20/2023)  Physical Activity: Sufficiently Active (09/20/2023)  Social Connections: Moderately Isolated (09/20/2023)  Stress: No Stress Concern Present (09/20/2023)  Tobacco Use: Low Risk  (12/07/2023)  Health Literacy: Adequate Health Literacy (09/20/2023)     FAMILY HISTORY Family History  Problem Relation Age of Onset   Heart failure  Mother    Colon cancer Father        early 58's   Ovarian cancer Sister 38   Colon polyps Sister    Breast cancer Maternal Aunt    Colon cancer Paternal Grandfather        unknown age of onset   Breast cancer Cousin 92 - 19   Colon cancer Cousin    Kidney cancer Brother    Colon polyps Brother    Lung cancer Brother    Rectal cancer Neg Hx    Stomach cancer Neg Hx    Esophageal cancer Neg Hx      ALLERGIES: has no known allergies.  MEDICATIONS  Current Outpatient Medications  Medication Sig Dispense Refill   ASPIRIN  LOW DOSE 81 MG tablet TAKE 1 TABLET BY MOUTH EVERY DAY 90 tablet  1   azelastine  (ASTELIN ) 0.1 % nasal spray Place 2 sprays into both nostrils 2 (two) times daily. Use in each nostril as directed 90 mL 1   Cholecalciferol (VITAMIN D3) 5000 units CAPS Take 1 capsule by mouth every other day.     cyclobenzaprine (FLEXERIL) 5 MG tablet Take 5 mg by mouth 3 (three) times daily as needed for muscle spasms.     HYDROcodone -acetaminophen  (NORCO) 10-325 MG tablet Take 1 tablet by mouth every 8 (eight) hours as needed. 90 tablet 0   isosorbide mononitrate (IMDUR) 30 MG 24 hr tablet Take 1 tablet (30 mg total) by mouth daily. 90 tablet 0   metoprolol tartrate (LOPRESSOR) 100 MG tablet Take tablet (100mg ) TWO hours prior to your cardiac CT scan. 1 tablet 0   mirtazapine (REMERON) 7.5 MG tablet Take 1 tablet (7.5 mg total) by mouth at bedtime. 30 tablet 0   omeprazole (PRILOSEC) 20 MG capsule TAKE 1 CAPSULE BY MOUTH DAILY 30 MINUTES BEFORE MORNING MEAL 90 capsule 1   predniSONE  (DELTASONE ) 20 MG tablet TAKE 1 TABLET (20 MG TOTAL) BY MOUTH DAILY WITH BREAKFAST FOR 5 DAYS AFTER EACH RITUXAN  INFUSION 5 tablet 6   rosuvastatin  (CRESTOR ) 10 MG tablet Take 1 tablet (10 mg total) by mouth daily. 90 tablet 0   vitamin B-12 (CYANOCOBALAMIN ) 500 MCG tablet Take 500 mcg by mouth every other day.     ALPRAZolam  (XANAX ) 0.5 MG tablet Take 0.5 mg by mouth as needed for anxiety. (Patient not  taking: Reported on 12/19/2023)     No current facility-administered medications for this visit.    PHYSICAL EXAMINATION: ECOG PERFORMANCE STATUS: 1 - Symptomatic but completely ambulatory VITALS: Vitals:   12/19/23 1054  BP: 120/73  Pulse: 87  Resp: 20  Temp: (!) 97.2 F (36.2 C)  SpO2: 100%   Filed Weights   12/19/23 1054  Weight: 126 lb 4.8 oz (57.3 kg)   Body mass index is 23.1 kg/m.  GENERAL: alert, in no acute distress and comfortable SKIN: no acute rashes, no significant lesions EYES: conjunctiva are pink and non-injected, sclera anicteric OROPHARYNX: MMM, no exudates, no oropharyngeal erythema or ulceration NECK: supple, no JVD LYMPH:  no palpable lymphadenopathy in the cervical, axillary or inguinal regions LUNGS: clear to auscultation b/l with normal respiratory effort HEART: regular rate & rhythm ABDOMEN:  normoactive bowel sounds , non tender, not distended, no hepatosplenomegaly Extremity: no pedal edema PSYCH: alert & oriented x 3 with fluent speech NEURO: no focal motor/sensory deficits  LABORATORY DATA:   I have reviewed the data as listed     Latest Ref Rng & Units 12/19/2023   10:47 AM 09/19/2023    8:34 AM 06/21/2023    8:57 AM  CBC EXTENDED  WBC 4.0 - 10.5 K/uL 8.1  8.9  7.5   RBC 3.87 - 5.11 MIL/uL 3.45  3.69  4.01   Hemoglobin 12.0 - 15.0 g/dL 89.0  88.2  87.3   HCT 36.0 - 46.0 % 32.8  35.1  37.3   Platelets 150 - 400 K/uL 282  251  245   NEUT# 1.7 - 7.7 K/uL 6.0  6.1  5.1   Lymph# 0.7 - 4.0 K/uL 1.0  1.3  1.2     LDH:  Lab Results  Component Value Date   LDH 202 (H) 12/19/2023        Latest Ref Rng & Units 12/19/2023   10:47 AM 09/19/2023    8:34 AM 06/21/2023    8:57  AM  CMP  Glucose 70 - 99 mg/dL 895  77  99   BUN 8 - 23 mg/dL 24  29  20    Creatinine 0.44 - 1.00 mg/dL 8.80  9.00  8.84   Sodium 135 - 145 mmol/L 141  139  138   Potassium 3.5 - 5.1 mmol/L 3.9  4.4  4.1   Chloride 98 - 111 mmol/L 105  105  102   CO2 22 - 32  mmol/L 28  28  27    Calcium  8.9 - 10.3 mg/dL 9.5  9.2  9.8   Total Protein 6.5 - 8.1 g/dL 6.6  6.3  7.0   Total Bilirubin 0.0 - 1.2 mg/dL 0.7  0.7  0.9   Alkaline Phos 38 - 126 U/L 72  56  71   AST 15 - 41 U/L 20  20  19    ALT 0 - 44 U/L 15  22  11       RADIOGRAPHIC STUDIES: I have personally reviewed the radiological images as listed and agreed with the findings in the report. CT CORONARY MORPH W/CTA COR W/SCORE W/CA W/CM &/OR WO/CM Addendum Date: 12/21/2023 ADDENDUM REPORT: 12/21/2023 21:02 EXAM: OVER-READ INTERPRETATION  CT CHEST The following report is an over-read performed by radiologist Dr. Oneil Devonshire of [facility] on 12/21/2023. This over-read does not include interpretation of cardiac or coronary anatomy or pathology. The coronary calcium  score/coronary CTA interpretation by the cardiologist is attached. COMPARISON:  None. FINDINGS: Cardiovascular: Atherosclerotic calcifications of the thoracic aorta are noted. No aortic dilatation is seen. Mediastinum/Nodes: There are no enlarged lymph nodes within the visualized mediastinum. Lungs/Pleura: There is no pleural effusion. The visualized lungs appear clear. Upper abdomen: No significant findings in the visualized upper abdomen. Musculoskeletal/Chest wall: Chronic compression deformities are noted in the thoracic spine. IMPRESSION: No significant extracardiac findings within the visualized chest. Electronically Signed   By: Oneil Devonshire M.D.   On: 12/21/2023 21:02   Result Date: 12/21/2023 CLINICAL DATA:  Chest pain EXAM: Cardiac/Coronary CTA TECHNIQUE: A non-contrast, gated CT scan was obtained with axial slices of 2.5 mm through the heart for calcium  scoring. Calcium  scoring was performed using the Agatston method. A 120 kV prospective, gated, contrast cardiac CT scan was obtained. Gantry rotation speed was 230 msec and collimation was 0.63 mm. Two sublingual nitroglycerin tablets (0.8 mg) were given. The 3D data set was  reconstructed with motion correction for the best systolic or diastolic phase. Images were analyzed on a dedicated workstation using MPR, MIP, and VRT modes. The patient received 95 cc of contrast. FINDINGS: Image quality: Excellent. Noise artifact is: Limited. Coronary Arteries:  Normal coronary origin.  Right dominance. Left main: The left main is a large caliber vessel with a normal take off from the left coronary cusp that bifurcates to form a left anterior descending artery and a left circumflex artery. There is no plaque or stenosis. Left anterior descending artery: The LAD is patent with prox/mid vessel calcified plaque with mild stenosis 30-49%. The LAD gives off 2 patent diagonal branches. Moderate-size ramus/high diagonal branch with no stenosis. Left circumflex artery: The LCX is non-dominant and patent diffuse proximal and mid calcified plaque with mild stenosis 30-49%. The LCX gives off 2 patent obtuse marginal branches. Right coronary artery: The RCA is dominant with normal take off from the right coronary cusp. There is mid stenosis 50-69%, FFR 0.89 post lesion, non flow-limiting. The RCA terminates as a PDA and right posterolateral branch without evidence of  plaque or stenosis. Right Atrium: Right atrial size is within normal limits. Right Ventricle: The right ventricular cavity is within normal limits. Left Atrium: Left atrial size is normal in size with no left atrial appendage filling defect. Left Ventricle: The ventricular cavity size is within normal limits. Pulmonary arteries: Normal in size. Pulmonary veins: Normal pulmonary venous drainage. Pericardium: Normal thickness without significant effusion or calcium  present. Cardiac valves: The aortic valve is trileaflet without significant calcification. Severe mitral annular calcification. Aorta: Normal caliber.  Moderate aortic atherosclerosis. Extra-cardiac findings: See attached radiology report for non-cardiac structures. IMPRESSION: 1.  Coronary calcium  score of 655. This was 78 percentile for age-, sex, and race-matched controls. 2. Normal coronary origin with right dominance. 3. Calcified proximal and mid LAD and circumflex with mild stenosis 30-49%. Mid stenosis of RCA 50 to 69%, moderate, with FFR 0.89 post lesion non flow-limiting. 4. Moderate aortic atherosclerosis. 5. Severe mitral annular calcification. Electronically Signed: By: Oneil Parchment M.D. On: 12/15/2023 13:35   CT CORONARY FRACTIONAL FLOW RESERVE FLUID ANALYSIS Result Date: 12/15/2023 EXAM: FFRCT ANALYSIS FINDINGS: FFRct analysis was performed on the original cardiac CT angiogram dataset. Diagrammatic representation of the FFRct analysis is provided in a separate PDF document in PACS. This dictation was created using the PDF document and an interactive 3D model of the results. 3D model is not available in the EMR/PACS. Normal FFR range is >0.80. 1. Left Main: Normal 2. LAD: Normal 3. LCX: Normal 4. Ramus: Normal 5. RCA: Normal, 0.87 post moderate mid lesion IMPRESSION: 1. Normal FFR analysis with no flow-limiting coronary artery disease. Note: These examples are not recommendations of [employer] and only provided as examples of what other customers are doing. Electronically Signed   By: Oneil Parchment M.D.   On: 12/15/2023 13:37     ASSESSMENT & PLAN:  78 y.o. female with  1) Stage III/IV Mantle cell lymphoma 01/11/2020 PET/CT [phone]) revealed no visible disease, continued resolution.  2) s/p hypersensitivity reaction to Rapid Rituxan . - rash and low grade fever -  resolved with solumedrol/tylenol , benadryl  and famotidine    PLAN: - Discussed lab results on 12/19/2023 in detail with patient: CBC showed WBC of 8.1K, Hemoglobin of 10.9 decreased from 11.7, and PLTs of 282K.  Mild anemia. CMP stable.  Independently reviewed LDH: 202. Patient has no evidence of mantle cell lymphoma recurrence/progression at this time. No notably toxicities from previous dose of  Rituxumab, Will proceed with planned maintenance Rituxumab today with same supportive medications.  FOLLOW-UP  Please schedule next 2 doses of maintenance Rituxan  every 90 days with labs and MD visits  The total time spent in the appointment was 30 minutes* .  All of the patient's questions were answered and the patient knows to call the clinic with any problems, questions, or concerns.  Emaline Saran MD MS AAHIVMS [facility] Hematology/Oncology Physician [facility]  *Total Encounter Time as defined by the Centers for Medicare and Medicaid Services includes, in addition to the face-to-face time of a patient visit (documented in the note above) non-face-to-face time: obtaining and reviewing outside history, ordering and reviewing medications, tests or procedures, care coordination (communications with other health care professionals or caregivers) and documentation in the medical record.  I,Emily Lagle,acting as a neurosurgeon for Emaline Saran, MD.,have documented all relevant documentation on the behalf of Emaline Saran, MD,as directed by  Emaline Saran, MD while in the presence of Emaline Saran, MD.  I have reviewed the above documentation for accuracy and completeness, and I  agree with the above.  Markees Carns, MD

## 2023-12-20 ENCOUNTER — Encounter: Payer: Self-pay | Admitting: Internal Medicine

## 2023-12-20 ENCOUNTER — Ambulatory Visit: Admitting: Cardiology

## 2023-12-20 ENCOUNTER — Other Ambulatory Visit: Payer: Self-pay | Admitting: Internal Medicine

## 2023-12-20 DIAGNOSIS — I25118 Atherosclerotic heart disease of native coronary artery with other forms of angina pectoris: Secondary | ICD-10-CM

## 2023-12-25 ENCOUNTER — Encounter: Payer: Self-pay | Admitting: Hematology

## 2023-12-28 ENCOUNTER — Other Ambulatory Visit: Payer: Self-pay

## 2023-12-28 DIAGNOSIS — E039 Hypothyroidism, unspecified: Secondary | ICD-10-CM | POA: Insufficient documentation

## 2023-12-28 DIAGNOSIS — R519 Headache, unspecified: Secondary | ICD-10-CM | POA: Insufficient documentation

## 2023-12-28 DIAGNOSIS — T8859XA Other complications of anesthesia, initial encounter: Secondary | ICD-10-CM | POA: Insufficient documentation

## 2023-12-28 DIAGNOSIS — M549 Dorsalgia, unspecified: Secondary | ICD-10-CM | POA: Insufficient documentation

## 2023-12-28 DIAGNOSIS — M1990 Unspecified osteoarthritis, unspecified site: Secondary | ICD-10-CM | POA: Insufficient documentation

## 2023-12-28 DIAGNOSIS — E785 Hyperlipidemia, unspecified: Secondary | ICD-10-CM | POA: Insufficient documentation

## 2023-12-28 DIAGNOSIS — R911 Solitary pulmonary nodule: Secondary | ICD-10-CM | POA: Insufficient documentation

## 2023-12-28 DIAGNOSIS — A048 Other specified bacterial intestinal infections: Secondary | ICD-10-CM | POA: Insufficient documentation

## 2023-12-28 DIAGNOSIS — I499 Cardiac arrhythmia, unspecified: Secondary | ICD-10-CM | POA: Insufficient documentation

## 2023-12-28 DIAGNOSIS — T7840XA Allergy, unspecified, initial encounter: Secondary | ICD-10-CM | POA: Insufficient documentation

## 2023-12-28 DIAGNOSIS — K219 Gastro-esophageal reflux disease without esophagitis: Secondary | ICD-10-CM | POA: Insufficient documentation

## 2023-12-28 DIAGNOSIS — K449 Diaphragmatic hernia without obstruction or gangrene: Secondary | ICD-10-CM | POA: Insufficient documentation

## 2023-12-28 DIAGNOSIS — C801 Malignant (primary) neoplasm, unspecified: Secondary | ICD-10-CM | POA: Insufficient documentation

## 2023-12-30 ENCOUNTER — Encounter: Payer: Self-pay | Admitting: Cardiology

## 2023-12-30 ENCOUNTER — Ambulatory Visit: Attending: Cardiology | Admitting: Cardiology

## 2023-12-30 VITALS — BP 118/82 | HR 90 | Ht 62.0 in | Wt 125.4 lb

## 2023-12-30 DIAGNOSIS — E785 Hyperlipidemia, unspecified: Secondary | ICD-10-CM | POA: Diagnosis not present

## 2023-12-30 DIAGNOSIS — I700 Atherosclerosis of aorta: Secondary | ICD-10-CM

## 2023-12-30 DIAGNOSIS — R0609 Other forms of dyspnea: Secondary | ICD-10-CM

## 2023-12-30 DIAGNOSIS — I25118 Atherosclerotic heart disease of native coronary artery with other forms of angina pectoris: Secondary | ICD-10-CM

## 2023-12-30 DIAGNOSIS — E7841 Elevated Lipoprotein(a): Secondary | ICD-10-CM

## 2023-12-30 MED ORDER — ROSUVASTATIN CALCIUM 20 MG PO TABS: 20.0000 mg | ORAL_TABLET | Freq: Every day | ORAL | 3 refills | Status: AC

## 2023-12-30 NOTE — Patient Instructions (Signed)
 Medication Instructions:   INCREASE: Crestor  to 20mg  daily   Lab Work: Your physician recommends that you return for lab work in: 6 weeks You need to have labs done when you are fasting.  You can come Monday through Friday 8:30 am to 12:00 pm and 1:15 to 4:30. You do not need to make an appointment as the order has already been placed.     Testing/Procedures: Your physician has requested that you have an echocardiogram. Echocardiography is a painless test that uses sound waves to create images of your heart. It provides your doctor with information about the size and shape of your heart and how well your heart's chambers and valves are working. This procedure takes approximately one hour. There are no restrictions for this procedure. Please do NOT wear cologne, perfume, aftershave, or lotions (deodorant is allowed). Please arrive 15 minutes prior to your appointment time.  Please note: We ask at that you not bring children with you during ultrasound (echo/ vascular) testing. Due to room size and safety concerns, children are not allowed in the ultrasound rooms during exams. Our front office staff cannot provide observation of children in our lobby area while testing is being conducted. An adult accompanying a patient to their appointment will only be allowed in the ultrasound room at the discretion of the ultrasound technician under special circumstances. We apologize for any inconvenience.    Follow-Up: At [facility], you and your health needs are our priority.  As part of our continuing mission to provide you with exceptional heart care, we have created designated Provider Care Teams.  These Care Teams include your primary Cardiologist (physician) and Advanced Practice Providers (APPs -  Physician Assistants and Nurse Practitioners) who all work together to provide you with the care you need, when you need it.  We recommend signing up for the patient portal called MyChart.  Sign up  information is provided on this After Visit Summary.  MyChart is used to connect with patients for Virtual Visits (Telemedicine).  Patients are able to view lab/test results, encounter notes, upcoming appointments, etc.  Non-urgent messages can be sent to your provider as well.   To learn more about what you can do with MyChart, go to [email or web address].    Your next appointment:   3 month(s)  The format for your next appointment:   In Person  Provider:   Lamar Fitch, MD    Other Instructions NA

## 2023-12-30 NOTE — Progress Notes (Signed)
 Cardiology Office Note:    Date:  12/30/2023   ID:  Brooke [name], Brooke [name] 1945/06/19, MRN [id number]  PCP:  Joshua Debby CROME, MD  Cardiologist:  Lamar Fitch, MD    Referring MD: Joshua Debby CROME, MD   No chief complaint on file. Doing fine  History of Present Illness:    Brooke [name] is a 78 y.o. 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  Recently she still complain having more shortness of breath her primary care physician scheduled to have coronary CT angio, coronary CT angio showed moderate disease of RCA however FFR was negative.  She comes to talk about this.  Her calcium  score is elevated also total of 655.  Actually she described to have some shortness of breath while walking.  She does not exercise on the regular basis.  Shortness of breath appears to be worse than before, no swelling of lower extremities no paroxysmal nocturnal dyspnea no palpitation dizziness  Past Medical History:  Diagnosis Date   Allergy    Arthritis    Atherosclerosis of aorta 04/28/2020   The 10-year ASCVD risk score Verdon DC Jr., et al., 2013) is: 12.1%    Values used to calculate the score:      Age: 66 years      Sex: Female      Is Non-Hispanic African American: No      Diabetic: No      Tobacco smoker: No      Systolic Blood Pressure: 118 mmHg      Is BP treated: No      HDL Cholesterol: 62.3 mg/dL      Total Cholesterol: 161 mg/dL     Back pain    Complication of anesthesia    bp dropped yrs ago, recent surgeries went ok   Coronary artery disease of native artery of native heart with stable angina pectoris 12/15/2023   Counseling regarding advance care planning and goals of care 10/08/2019   Current moderate episode of major depressive disorder without prior episode (HCC) 12/07/2023   DDD (degenerative disc disease),  lumbar 12/30/2021   Drug-induced neutropenia 12/28/2019   Dysrhythmia    occ, no meds for   Elevated Lp(a) 08/10/2022   Encounter for general adult medical examination with abnormal findings 05/01/2020   GAD (generalized anxiety disorder) 12/07/2023   Gastritis    GERD 05/28/2008   Qualifier: Diagnosis of   By: Genie CMA (AAMA), Chick         GERD (gastroesophageal reflux disease)    H. 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      Aldo/renin are normal     Pulmonary nodule    Seasonal allergic rhinitis due to fungal spores 03/17/2023   Visit for screening mammogram 04/28/2020    Past  Surgical History:  Procedure Laterality Date   APPENDECTOMY     COLONOSCOPY  12/07/2013   COLONOSCOPY W/ POLYPECTOMY  11/14/2017   TA   DILATION AND CURETTAGE OF UTERUS     EYE SURGERY Bilateral 07/2022   Cataract surgery   LAPAROSCOPIC CHOLECYSTECTOMY SINGLE SITE WITH INTRAOPERATIVE CHOLANGIOGRAM N/A 10/28/2015   Procedure: LAPAROSCOPIC CHOLECYSTECTOMY WITH INTRAOPERATIVE CHOLANGIOGRAM;  Surgeon: Krystal Spinner, MD;  Location: [facility];  Service: General;  Laterality: N/A;   LAPAROSCOPY  yrs ago   x 2   TMJ ARTHROSCOPY     TOTAL ABDOMINAL HYSTERECTOMY     complete    Current Medications: Current Meds  Medication Sig   ASPIRIN  LOW DOSE 81 MG tablet TAKE 1 TABLET BY MOUTH EVERY DAY   azelastine  (ASTELIN ) 0.1 % nasal spray Place 2 sprays into both nostrils 2 (two)  times daily. Use in each nostril as directed   Cholecalciferol (VITAMIN D3) 5000 units CAPS Take 1 capsule by mouth every other day.   cyclobenzaprine (FLEXERIL) 5 MG tablet Take 5 mg by mouth 3 (three) times daily as needed for muscle spasms.   HYDROcodone -acetaminophen  (NORCO) 10-325 MG tablet Take 1 tablet by mouth every 8 (eight) hours as needed.   isosorbide  mononitrate (IMDUR ) 30 MG 24 hr tablet Take 1 tablet (30 mg total) by mouth daily.   metoprolol  tartrate (LOPRESSOR ) 100 MG tablet Take tablet (100mg ) TWO hours prior to your cardiac CT scan.   mirtazapine  (REMERON ) 7.5 MG tablet Take 1 tablet (7.5 mg total) by mouth at bedtime.   omeprazole (PRILOSEC) 20 MG capsule TAKE 1 CAPSULE BY MOUTH DAILY 30 MINUTES BEFORE MORNING MEAL   predniSONE  (DELTASONE ) 20 MG tablet TAKE 1 TABLET (20 MG TOTAL) BY MOUTH DAILY WITH BREAKFAST FOR 5 DAYS AFTER EACH RITUXAN  INFUSION   rosuvastatin  (CRESTOR ) 10 MG tablet Take 1 tablet (10 mg total) by mouth daily.   vitamin B-12 (CYANOCOBALAMIN ) 500 MCG tablet Take 500 mcg by mouth every other day.     Allergies:   Patient has no known allergies.   Social History   Socioeconomic History   Marital status: Widowed    Spouse name: Not on file   Number of children: 2   Years of education: Not on file   Highest education level: Not on file  Occupational History   Occupation: Retired  Tobacco Use   Smoking status: Never   Smokeless tobacco: Never  Vaping Use   Vaping status: Never Used  Substance and Sexual Activity   Alcohol use: Yes    Alcohol/week: 4.0 - 5.0 standard drinks of alcohol    Types: 4 - 5 Cans of beer per week    Comment: 4-5 beers weekly-occ per pt   Drug use: No   Sexual activity: Not Currently    Birth control/protection: Post-menopausal, Surgical  Other Topics Concern   Not on file  Social History Narrative   1 child deceased   1 adopted daughter   Mostly lives alone, daughter will being staying soon.  She will have her first  grand baby.   Lives alone/2025   Social Drivers of Health   Financial Resource Strain: Low Risk  (09/20/2023)   Overall Financial Resource Strain (CARDIA)    Difficulty of Paying Living Expenses: Not hard at all  Food Insecurity: No Food Insecurity (09/20/2023)   Hunger Vital Sign    Worried About Running Out of Food in the Last Year: Never true    Ran Out of Food in the  Last Year: Never true  Transportation Needs: No Transportation Needs (09/20/2023)   PRAPARE - Administrator, Civil Service (Medical): No    Lack of Transportation (Non-Medical): No  Physical Activity: Sufficiently Active (09/20/2023)   Exercise Vital Sign    Days of Exercise per Week: 3 days    Minutes of Exercise per Session: 50 min  Stress: No Stress Concern Present (09/20/2023)   [employer] of Occupational Health - Occupational Stress Questionnaire    Feeling of Stress: Not at all  Social Connections: Moderately Isolated (09/20/2023)   Social Connection and Isolation Panel    Frequency of Communication with Friends and Family: Once a week    Frequency of Social Gatherings with Friends and Family: Once a week    Attends Religious Services: More than 4 times per year    Active Member of [employer] or Organizations: Yes    Attends Banker Meetings: Never    Marital Status: Widowed     Family History: The patient's family history includes Breast cancer in her maternal aunt; Breast cancer (age of onset: 28 - 51) in her cousin; Colon cancer in her cousin, father, and paternal grandfather; Colon polyps in her brother and sister; Heart failure in her mother; Kidney cancer in her brother; Lung cancer in her brother; Ovarian cancer (age of onset: 84) in her sister. There is no history of Rectal cancer, Stomach cancer, or Esophageal cancer. ROS:   Please see the history of present illness.    All 14 point review of systems negative except as described per history of present illness  EKGs/Labs/Other  Studies Reviewed:    EKG Interpretation Date/Time:  Friday December 30 2023 14:53:40 EST Ventricular Rate:  90 PR Interval:  142 QRS Duration:  64 QT Interval:  352 QTC Calculation: 430 R Axis:   39  Text Interpretation: Normal sinus rhythm Normal ECG When compared with ECG of 19-Feb-2020 18:04, PREVIOUS ECG IS PRESENT Confirmed by Bernie Charleston [phone]) on 12/30/2023 3:12:16 PM    Recent Labs: 12/07/2023: Pro B Natriuretic peptide (BNP) 72.0; TSH 1.35 12/19/2023: ALT 15; BUN 24; Creatinine 1.19; Hemoglobin 10.9; Platelet Count 282; Potassium 3.9; Sodium 141  Recent Lipid Panel    Component Value Date/Time   CHOL 145 12/07/2023 1536   TRIG 119.0 12/07/2023 1536   HDL 59.90 12/07/2023 1536   CHOLHDL 2 12/07/2023 1536   VLDL 23.8 12/07/2023 1536   LDLCALC 61 12/07/2023 1536    Physical Exam:    VS:  BP 118/82   Pulse 90   Ht 5' 2 (1.575 m)   Wt 125 lb 6.4 oz (56.9 kg)   SpO2 99%   BMI 22.94 kg/m     Wt Readings from Last 3 Encounters:  12/30/23 125 lb 6.4 oz (56.9 kg)  12/19/23 126 lb 4.8 oz (57.3 kg)  12/07/23 123 lb 6.4 oz (56 kg)     GEN:  Well nourished, well developed in no acute distress HEENT: Normal NECK: No JVD; No carotid bruits LYMPHATICS: No lymphadenopathy CARDIAC: RRR, no murmurs, no rubs, no gallops RESPIRATORY:  Clear to auscultation without rales, wheezing or rhonchi  ABDOMEN: Soft, non-tender, non-distended MUSCULOSKELETAL:  No edema; No deformity  SKIN: Warm and dry LOWER EXTREMITIES: no swelling NEUROLOGIC:  Alert and oriented x 3 PSYCHIATRIC:  Normal affect   ASSESSMENT:    1. Coronary artery disease with stable angina pectoris, unspecified vessel or lesion type, unspecified whether native or transplanted heart   2. Coronary artery  disease of native artery of native heart with stable angina pectoris   3. Atherosclerosis of aorta   4. Elevated Lp(a)    PLAN:    In order of problems listed above:  Coronary disease moderate based  on coronary CT angio, does not have typical symptoms.  I recommend to continue antiplatelet therapy and need to incidentally find management of his risk factors.  She does have prescription for Imdur .  I told her if she wants to try it that should be fine however she does not have typical symptomatology.  The truth is especially in woman shortness of breath could be anginal equivalent that is why may be worth to try.  She said she would rather stay away from it.  I will schedule her to have echocardiogram to assess left ventricle ejection fraction to see if there is an explanation for her shortness of breath. Dyslipidemia she is taking Crestor  10, I did review [facility] which show me her LDL of 61 HDL 59 I would like to see her LDL now with this level of calcium  score and known coronary artery disease of less than 55, therefore, I will double the dose of Crestor  to 20 g daily, fasting lipid profile, AST LT will happen in 6 weeks. Elevated LP(a) plan as described above   Medication Adjustments/Labs and Tests Ordered: Current medicines are reviewed at length with the patient today.  Concerns regarding medicines are outlined above.  Orders Placed This Encounter  Procedures   EKG 12-Lead   Medication changes: No orders of the defined types were placed in this encounter.   Signed, Lamar DOROTHA Fitch, MD, [facility] 12/30/2023 3:30 PM    [facility]

## 2024-02-10 ENCOUNTER — Ambulatory Visit (HOSPITAL_COMMUNITY)
Admission: RE | Admit: 2024-02-10 | Discharge: 2024-02-10 | Disposition: A | Discharge status: Home / Self Care | Source: Ambulatory Visit | Attending: Cardiology | Admitting: Cardiology

## 2024-02-10 DIAGNOSIS — R0609 Other forms of dyspnea: Secondary | ICD-10-CM

## 2024-02-10 LAB — ECHOCARDIOGRAM COMPLETE
Area-P 1/2: 3.19 cm2
S' Lateral: 2.4 cm

## 2024-02-11 LAB — ALT: ALT: 17 IU/L (ref 0–32)

## 2024-02-11 LAB — LIPID PANEL
Chol/HDL Ratio: 2.4 ratio (ref 0.0–4.4)
Cholesterol, Total: 150 mg/dL (ref 100–199)
HDL: 62 mg/dL
LDL Chol Calc (NIH): 67 mg/dL (ref 0–99)
Triglycerides: 119 mg/dL (ref 0–149)
VLDL Cholesterol Cal: 21 mg/dL (ref 5–40)

## 2024-02-11 LAB — AST: AST: 24 IU/L (ref 0–40)

## 2024-02-13 ENCOUNTER — Ambulatory Visit: Payer: Self-pay | Admitting: Cardiology

## 2024-02-14 ENCOUNTER — Telehealth: Payer: Self-pay

## 2024-02-14 NOTE — Telephone Encounter (Signed)
 Pt viewed lab results on My Chart per Dr. Karry note. Routed to PCP.

## 2024-02-14 NOTE — Telephone Encounter (Signed)
 Pt viewed Echo results on My Chart per Dr. Vanetta Shawl note. Routed to PCP.

## 2024-03-16 ENCOUNTER — Other Ambulatory Visit: Payer: Self-pay | Admitting: Internal Medicine

## 2024-03-16 ENCOUNTER — Other Ambulatory Visit: Payer: Self-pay

## 2024-03-16 DIAGNOSIS — I700 Atherosclerosis of aorta: Secondary | ICD-10-CM

## 2024-03-16 DIAGNOSIS — C8318 Mantle cell lymphoma, lymph nodes of multiple sites: Secondary | ICD-10-CM

## 2024-03-19 ENCOUNTER — Inpatient Hospital Stay

## 2024-03-19 ENCOUNTER — Inpatient Hospital Stay: Attending: Hematology

## 2024-03-19 ENCOUNTER — Inpatient Hospital Stay: Admitting: Hematology

## 2024-04-02 ENCOUNTER — Ambulatory Visit: Admitting: Cardiology

## 2024-09-20 ENCOUNTER — Ambulatory Visit
